# Patient Record
Sex: Male | Born: 1965 | Race: Black or African American | Hispanic: No | Marital: Single | Smoking: Current every day smoker
Health system: Southern US, Community
[De-identification: ages and names within clinical notes are randomized; demographics above are authoritative.]

## PROBLEM LIST (undated history)

## (undated) DIAGNOSIS — E559 Vitamin D deficiency, unspecified: Secondary | ICD-10-CM

## (undated) DIAGNOSIS — R569 Unspecified convulsions: Secondary | ICD-10-CM

## (undated) DIAGNOSIS — F329 Major depressive disorder, single episode, unspecified: Secondary | ICD-10-CM

## (undated) DIAGNOSIS — E785 Hyperlipidemia, unspecified: Secondary | ICD-10-CM

## (undated) DIAGNOSIS — I5032 Chronic diastolic (congestive) heart failure: Secondary | ICD-10-CM

## (undated) DIAGNOSIS — I517 Cardiomegaly: Secondary | ICD-10-CM

## (undated) DIAGNOSIS — I2510 Atherosclerotic heart disease of native coronary artery without angina pectoris: Secondary | ICD-10-CM

## (undated) DIAGNOSIS — F32A Depression, unspecified: Secondary | ICD-10-CM

## (undated) DIAGNOSIS — I495 Sick sinus syndrome: Secondary | ICD-10-CM

## (undated) DIAGNOSIS — G4730 Sleep apnea, unspecified: Secondary | ICD-10-CM

## (undated) HISTORY — DX: Cardiomegaly: I51.7

## (undated) HISTORY — DX: Sick sinus syndrome: I49.5

## (undated) HISTORY — DX: Atherosclerotic heart disease of native coronary artery without angina pectoris: I25.10

## (undated) HISTORY — DX: Sleep apnea, unspecified: G47.30

## (undated) HISTORY — DX: Chronic diastolic (congestive) heart failure: I50.32

---

## 2007-12-13 ENCOUNTER — Emergency Department: Payer: Self-pay | Admitting: Emergency Medicine

## 2009-12-12 ENCOUNTER — Emergency Department: Payer: Self-pay | Admitting: Emergency Medicine

## 2011-11-05 ENCOUNTER — Emergency Department: Payer: Self-pay | Admitting: Emergency Medicine

## 2012-05-28 DIAGNOSIS — N529 Male erectile dysfunction, unspecified: Secondary | ICD-10-CM | POA: Insufficient documentation

## 2012-09-10 ENCOUNTER — Emergency Department: Payer: Self-pay | Admitting: Emergency Medicine

## 2013-03-04 ENCOUNTER — Encounter (HOSPITAL_COMMUNITY): Payer: Self-pay | Admitting: Emergency Medicine

## 2013-03-04 ENCOUNTER — Emergency Department (HOSPITAL_COMMUNITY): Payer: Self-pay

## 2013-03-04 ENCOUNTER — Emergency Department (HOSPITAL_COMMUNITY)
Admission: EM | Admit: 2013-03-04 | Discharge: 2013-03-04 | Disposition: A | Discharge status: Home / Self Care | Payer: Self-pay | Attending: Emergency Medicine | Admitting: Emergency Medicine

## 2013-03-04 DIAGNOSIS — R0789 Other chest pain: Secondary | ICD-10-CM | POA: Insufficient documentation

## 2013-03-04 DIAGNOSIS — B9789 Other viral agents as the cause of diseases classified elsewhere: Secondary | ICD-10-CM

## 2013-03-04 DIAGNOSIS — R062 Wheezing: Secondary | ICD-10-CM

## 2013-03-04 DIAGNOSIS — F17200 Nicotine dependence, unspecified, uncomplicated: Secondary | ICD-10-CM | POA: Insufficient documentation

## 2013-03-04 DIAGNOSIS — R51 Headache: Secondary | ICD-10-CM | POA: Insufficient documentation

## 2013-03-04 DIAGNOSIS — J069 Acute upper respiratory infection, unspecified: Secondary | ICD-10-CM | POA: Insufficient documentation

## 2013-03-04 DIAGNOSIS — Z79899 Other long term (current) drug therapy: Secondary | ICD-10-CM | POA: Insufficient documentation

## 2013-03-04 DIAGNOSIS — G4730 Sleep apnea, unspecified: Secondary | ICD-10-CM | POA: Insufficient documentation

## 2013-03-04 DIAGNOSIS — G40909 Epilepsy, unspecified, not intractable, without status epilepticus: Secondary | ICD-10-CM | POA: Insufficient documentation

## 2013-03-04 DIAGNOSIS — R5381 Other malaise: Secondary | ICD-10-CM | POA: Insufficient documentation

## 2013-03-04 DIAGNOSIS — R5383 Other fatigue: Secondary | ICD-10-CM

## 2013-03-04 HISTORY — DX: Unspecified convulsions: R56.9

## 2013-03-04 MED ORDER — AEROCHAMBER PLUS W/MASK MISC
1.0000 | Freq: Once | Status: AC
  Administered 2013-03-04: 1
  Filled 2013-03-04: qty 1

## 2013-03-04 MED ORDER — ALBUTEROL SULFATE (2.5 MG/3ML) 0.083% IN NEBU
5.0000 mg | INHALATION_SOLUTION | Freq: Once | RESPIRATORY_TRACT | Status: AC
  Administered 2013-03-04: 5 mg via RESPIRATORY_TRACT
  Filled 2013-03-04: qty 6

## 2013-03-04 MED ORDER — NAPROXEN 250 MG PO TABS
500.0000 mg | ORAL_TABLET | Freq: Once | ORAL | Status: AC
  Administered 2013-03-04: 500 mg via ORAL
  Filled 2013-03-04: qty 2

## 2013-03-04 MED ORDER — ALBUTEROL SULFATE HFA 108 (90 BASE) MCG/ACT IN AERS
2.0000 | INHALATION_SPRAY | Freq: Once | RESPIRATORY_TRACT | Status: AC
  Administered 2013-03-04: 2 via RESPIRATORY_TRACT
  Filled 2013-03-04: qty 6.7

## 2013-03-04 NOTE — ED Notes (Signed)
Pt presents to department for evaluation of cold like symptoms. Pt states nasal and chest congestion. Also states productive cough. Denies fever. Respirations unlabored. Pt is alert and oriented x4.

## 2013-03-04 NOTE — Discharge Instructions (Signed)
1. Medications: Delsym cough suppressant, ibuprofen for fever control and headaches, albuterol for wheezing and shortness of breath, usual home medications 2. Treatment: rest, drink plenty of fluids,  3. Follow Up: Please followup with your primary doctor for discussion of your diagnoses and further evaluation after today's visit; if you do not have a primary care doctor use the resource guide provided to find one;     Read the instructions below on reasons to return to the emergency department and to learn more about your diagnosis.  Use over the counter medications for symptomatic relief as we discussed (musinex as a decongestant, Tylenol for fever/pain, Motrin/Ibuprofen for muscle aches).  Followup with your primary care doctor in 4 days if your symptoms persist.  Your more than welcome to return to the emergency department if symptoms worsen or become concerning.  Upper Respiratory Infection, Adult  An upper respiratory infection (URI) is also sometimes known as the common cold. Most people improve within 1 week, but symptoms can last up to 2 weeks. A residual cough may last even longer.   URI is most commonly caused by a virus. Viruses are NOT treated with antibiotics. You can easily spread the virus to others by oral contact. This includes kissing, sharing a glass, coughing, or sneezing. Touching your mouth or nose and then touching a surface, which is then touched by another person, can also spread the virus.   TREATMENT  Treatment is directed at relieving symptoms. There is no cure. Antibiotics are not effective, because the infection is caused by a virus, not by bacteria. Treatment may include:  Increased fluid intake. Sports drinks offer valuable electrolytes, sugars, and fluids.  Breathing heated mist or steam (vaporizer or shower).  Eating chicken soup or other clear broths, and maintaining good nutrition.  Getting plenty of rest.  Using gargles or lozenges for comfort.  Controlling  fevers with ibuprofen or acetaminophen as directed by your caregiver.  Increasing usage of your inhaler if you have asthma.  Return to work when your temperature has returned to normal.   SEEK MEDICAL CARE IF:  After the first few days, you feel you are getting worse rather than better.  You develop worsening shortness of breath, or brown or red sputum. These may be signs of pneumonia.  You develop yellow or brown nasal discharge or pain in the face, especially when you bend forward. These may be signs of sinusitis.  You develop a fever, swollen neck glands, pain with swallowing, or white areas in the back of your throat. These may be signs of strep throat.     Emergency Department Resource Guide 1) Find a Doctor and Pay Out of Pocket Although you won't have to find out who is covered by your insurance plan, it is a good idea to ask around and get recommendations. You will then need to call the office and see if the doctor you have chosen will accept you as a new patient and what types of options they offer for patients who are self-pay. Some doctors offer discounts or will set up payment plans for their patients who do not have insurance, but you will need to ask so you aren't surprised when you get to your appointment.  2) Contact Your Local Health Department Not all health departments have doctors that can see patients for sick visits, but many do, so it is worth a call to see if yours does. If you don't know where your local health department is, you can check  in your phone book. The CDC also has a tool to help you locate your state's health department, and many state websites also have listings of all of their local health departments.  3) Find a Walk-in Clinic If your illness is not likely to be very severe or complicated, you may want to try a walk in clinic. These are popping up all over the country in pharmacies, drugstores, and shopping centers. They're usually staffed by nurse  practitioners or physician assistants that have been trained to treat common illnesses and complaints. They're usually fairly quick and inexpensive. However, if you have serious medical issues or chronic medical problems, these are probably not your best option.  No Primary Care Doctor: - Call Health Connect at  [phone] - they can help you locate a primary care doctor that  accepts your insurance, provides certain services, etc. - Physician Referral Service- [phone]  Chronic Pain Problems: Organization         Address  Phone   Notes  [facility]  [phone] Patients need to be referred by their primary care doctor.   Medication Assistance: Organization         Address  Phone   Notes  [facility] Medication [facility] [address] [place], [place] [address] [phone] --Must be a resident of [facility] -- Must have NO insurance coverage whatsoever (no Medicaid/ Medicare, etc.) -- The pt. MUST have a primary care doctor that directs their care regularly and follows them in the community   MedAssist  [phone]   [employer]  [phone]    Agencies that provide inexpensive medical care: Organization         Address  Phone   Notes  [facility]  [phone]   [facility]    [phone]   [facility] [address] [place], [place] [address] [phone]   [facility] [address] [place] [place] [phone]   Planned Parenthood    [phone]   [facility]    [phone]   Community Health and [facility]  [address], [place] Phone:  [phone], Fax:  [phone] Hours of Operation:  9 am - 6 pm, M-F.  Also accepts Medicaid/Medicare and self-pay.  [facility]  [address], [place] Phone: [phone], Fax: [phone]. Hours of Operation:  8:30 am -  5:30 pm, M-F.  Also accepts Medicaid and self-pay.  [facility] [place] [address] [place] Phone: [phone]   [facility] [address] Natasha Bence [place], [place] [phone], Ext. [phone] Mondays & Thursdays: 7-9 AM.  First 15 patients are seen on a first come, first serve basis.    Medicaid-accepting [facility] Providers:  Organization         Address  Phone   Notes  [facility] [address], [place] [phone] Also accepts self-pay patients.  [facility] [address] Laurell Josephs [place], [place]  [phone]   [facility] [address], [place] [phone]   [facility] [address], [place] [phone]   Renaye Rakers [address], [place]   [phone] Only accepts [place] Access [place] patients after they have their name applied to their  card.   Self-Pay (no insurance) in [place] [facility] [place]:  Organization         Address  Phone   Notes  Sickle Cell Patients, [facility] [address], [place] [phone]   [facility] [address], [place] [phone]) [phone]   [facility] [place]  [address], [place] [phone]   [facility]/Dr. Osei-Bonsu  [address] [place] [address], [place] or [phone], [place] [phone] Phone number for both [place] and [place] locations is the same.  [facility] [address], [place] [phone]   [facility] [place] [address] [place] [address], [place] or [address] [phone]) [phone]) [phone]   [facility] [address], [place] [phone], phone; [phone]) [phone], fax Sees patients 1st and 3rd Saturday of every month.  Must not qualify for public or private insurance (i.e. Medicaid, Medicare, [place] Health Choice,  Veterans' Benefits)  Household income should be no more than 200% of the poverty level The clinic cannot treat you if you are pregnant or think you are pregnant  Sexually transmitted diseases are not treated at the clinic.    Dental Care: Organization         Address  Phone  Notes  [facility] of [facility] [address], [place] [phone] Accepts children up to age 50 who are enrolled in [place] or [place] Health Choice; pregnant women with a Medicaid card; and children who have applied for Medicaid or [place] Health Choice, but were declined, whose parents can pay a reduced fee at time of service.  [facility] of [facility] [place]  [address], [place] [phone] Accepts children up to age 50 who are enrolled in [place] or [place] Health Choice; pregnant women with a Medicaid card; and children who have applied for Medicaid or [place] Health Choice, but were declined, whose parents can pay a reduced fee at time of service.  [facility]  [address], [place] [phone] Patients are seen by appointment only. Walk-ins are not accepted. [facility] will see patients 50 years of age and older. Monday - Tuesday (8am-5pm) Most Wednesdays (8:30-5pm) $30 per visit, cash only  [facility]  [address], [facility] [phone] Patients are seen by appointment only. Walk-ins are not accepted. [facility] will see patients 50 years of age and older. One Wednesday Evening (Monthly: Volunteer Based).  $30 per visit, cash only  [employer] [facility] [facility]  [phone]) [phone] for adults; Children under age 734, call Graduate Pediatric Dentistry at [phone]) [phone]. Children aged 664-14, please call [phone] to request a pediatric application.  Dental services are provided in all areas of dental care including fillings, crowns and bridges, complete and  partial dentures, implants, gum treatment, root canals, and extractions. Preventive care is also provided. Treatment is provided to both adults and children. Patients are selected via a lottery and there is often a waiting list.   [facility] [address], [place]  [phone]) [phone] [email or web address]   [facility] [address], [place], [place] [phone], Ext. [phone] Second and Fourth Thursday of each month, opens at 6:30 AM; Clinic ends at 9 AM.  Patients are seen on a first-come first-served basis, and  a limited number are seen during each clinic.   [facility]  [address] Hillard Danker [place], [place] [phone]   Eligibility Requirements You must have lived in [place], [place], or [place] counties for at least the last three months.   You cannot be eligible for state or federal sponsored [employer], including [employer], [place], or [employer].   You generally cannot be eligible for healthcare insurance through your employer.    How to apply: Eligibility screenings are held every Tuesday and Wednesday afternoon from 1:00 pm until 4:00 pm. You do not need an appointment for the interview!  [facility] [address], [place], [place]   [place]  [place] Department  [place]  [phone]    Behavioral Health Resources in the Community: Intensive Outpatient Programs Organization         Address  Phone  Notes  [place]. [address], [place], [place] [phone]   [facility] Outpatient [address] [place], [place]   ADS: Alcohol & Drug Svcs [address], [place], [place]   [place] [address],  [place], [place] or [phone]   Substance Abuse Resources Organization          Address  Phone  Notes  Alcohol and Drug Services  [phone]   [place]  [phone]   The [place]   Chinita Pester  [phone]   Residential & Outpatient Substance Abuse Program  [phone]   Psychological Services Organization         Address  Phone  Notes  [facility] [place]  [place]  [phone]   [place] [address], [place] or [phone]    Mobile Crisis Teams Organization         Address  Phone  Notes  [facility], Mobile Crisis Care Unit  [phone]   [facility]  [address] [place], [place]   [place] Levels [address], [place] [phone]    Self-Help/Support Groups Organization         Address  Phone             Notes  [place]. of [place] - variety of support groups  [place] Call for more information  Narcotics Anonymous (NA), [facility]aring [facility] [address], [employer] [place] [place]  2 meetings at this location   Special educational needs teacher         Address  Phone  Notes  [facility]idential [facility] [place],    [place]  [phone]   [facility]  [address], [place] [address], [place], [place]   [place], [place] [phone] Admissions: 8am-3pm M-F  Incentives Substance [place] [address],    [place], [place] [phone]   [facility] [address] Jadene Pierini [place], [place]   [facility] [address]  [place], [place]   [facility] - Intensive Outpatient [address], [place], [place]   [facility] [place].,  [place], [place] or [phone]   [facility] (RTS) [address] [place], [place] Accepts Medicaid  Fellowship  [address]  [place] [phone] Substance Abuse/Addiction Treatment   [facility] Organization         Address  Phone  Notes  [facility]  [phone]   Angie Fava, PhD [address] Ervin Knack [place], [place]   [phone] or [phone]   [facility]   [address] [place], [place] [phone]   [facility] [address], [place], [place] [phone] Insurance/Medicaid/sponsorship through [facility] and Families [address]                                    [place], [place] [phone] Therapy/tele-psych/case  [facility] [address], [place] [phone]    Dr. Lolly Mustache  [phone]   [facility] [place]  United Way [facility]. 1) [address], [place] 2) [address], [place] 3)  [address], [place] [phone]  [phone]   [facility] Child Abuse Hotline [phone] or [phone] (After Hours)

## 2013-03-04 NOTE — ED Provider Notes (Signed)
CSN: [id number]     Arrival date & time 03/04/13  1658 History  This chart was scribed for non-physician practitioner [name], PA-C working with [name], MD by [name], ED Scribe. This patient was seen in room TR05C/TR05C and the patient's care was started at 2004.    First MD Initiated Contact with Patient 03/04/13 2004     Chief Complaint  Patient presents with  . Nasal Congestion  . Cough    The history is provided by the patient and the spouse. No language interpreter was used.   HPI Comments: Seth Tucker is a 48 y.o. male with hx of seizures and sleep apnea, who presents to the Emergency Department complaining of 5 days of gradual onset, gradually worsening, moderate nasal and chest congestion with associated productive cough, subjective fever, mild SOB upon exertion. His cough is worse at night. He recently discontinued use of Lasix (several months ago) and reports that he only uses it as needed. He reports exposure to sick individuals. He denies any other pain.  Past Medical History  Diagnosis Date  . Seizure    History reviewed. No pertinent past surgical history. History reviewed. No pertinent family history. History  Substance Use Topics  . Smoking status: Current Every Day Smoker    Types: Cigarettes  . Smokeless tobacco: Not on file  . Alcohol Use: No    Review of Systems  Constitutional: Positive for fatigue. Negative for fever, chills and appetite change.  HENT: Positive for congestion, postnasal drip, rhinorrhea, sinus pressure and sore throat. Negative for ear discharge, ear pain and mouth sores.   Eyes: Negative for visual disturbance.  Respiratory: Positive for cough, chest tightness, shortness of breath and wheezing. Negative for stridor.   Cardiovascular: Negative for chest pain, palpitations and leg swelling.  Gastrointestinal: Negative for nausea, vomiting, abdominal pain and diarrhea.  Genitourinary: Negative for dysuria, urgency,  frequency and hematuria.  Musculoskeletal: Negative for arthralgias, back pain, myalgias and neck stiffness.  Skin: Negative for rash.  Neurological: Positive for headaches. Negative for syncope, light-headedness and numbness.  Hematological: Negative for adenopathy.  Psychiatric/Behavioral: The patient is not nervous/anxious.   All other systems reviewed and are negative.    Allergies  Review of patient's allergies indicates no known allergies.  Home Medications   Current Outpatient Rx  Name  Route  Sig  Dispense  Refill  . furosemide (LASIX) 20 MG tablet   Oral   Take 20 mg by mouth daily as needed for fluid or edema.          . naproxen sodium (ANAPROX) 220 MG tablet   Oral   Take 220 mg by mouth daily as needed (for pain).         Marland Kitchen PHENObarbital (LUMINAL) 100 MG tablet   Oral   Take 200 mg by mouth at bedtime.            BP 139/77  Pulse 74  Temp(Src) 98.8 F (37.1 C) (Oral)  Resp 24  Ht 5\' 11"  (1.803 m)  Wt 240 lb (108.863 kg)  BMI 33.49 kg/m2  SpO2 94%  Physical Exam  Constitutional: He is oriented to person, place, and time. He appears well-developed and well-nourished. No distress.  HENT:  Head: Normocephalic and atraumatic.  Right Ear: Tympanic membrane, external ear and ear canal normal.  Left Ear: Tympanic membrane, external ear and ear canal normal.  Nose: Mucosal edema and rhinorrhea present. No epistaxis. Right sinus exhibits no maxillary sinus tenderness and  no frontal sinus tenderness. Left sinus exhibits no maxillary sinus tenderness and no frontal sinus tenderness.  Mouth/Throat: Uvula is midline and mucous membranes are normal. Mucous membranes are not pale and not cyanotic. No oropharyngeal exudate, posterior oropharyngeal edema, posterior oropharyngeal erythema or tonsillar abscesses.  Nasal congestion and rhinorrhea  Eyes: Conjunctivae are normal. Pupils are equal, round, and reactive to light.  Neck: Normal range of motion and full  passive range of motion without pain.  Cardiovascular: Normal rate and intact distal pulses.   Pulmonary/Chest: Effort normal. No stridor. He has wheezes.  Inspiratory and expiratory wheezes throughout. No rales or rhonchi. Actively coughing.   Abdominal: Soft. Bowel sounds are normal. There is no tenderness.  Musculoskeletal: Normal range of motion.  Lymphadenopathy:    He has no cervical adenopathy.  Neurological: He is alert and oriented to person, place, and time.  Skin: Skin is dry. No rash noted. He is not diaphoretic.  Psychiatric: He has a normal mood and affect.    ED Course  Procedures (including critical care time) DIAGNOSTIC STUDIES: Oxygen Saturation is 94% on room air, adequate by my interpretation.    COORDINATION OF CARE: 8:37 PM Discussed treatment plan which includes Hycodan, a breathing treatment and 800 mg ibuprofen 3x/day with pt at bedside and pt agreed to plan. Discussed CXR results.Advised pt importance of staying hydrated. Advised pt to follow up with PCP if symptoms do not improve in a week.   Labs Review Labs Reviewed - No data to display Imaging Review Dg Chest 2 View  03/04/2013   CLINICAL DATA:  Nasal congestion, cough  EXAM: CHEST - 2 VIEW  COMPARISON:  None available  FINDINGS: Mild thin linear opacities in the right mid lung and both lung bases. No confluent airspace consolidation. . No effusion. Visualized skeletal structures are unremarkable.  IMPRESSION: No acute cardiopulmonary disease.   Electronically Signed   By: Oley Balmaniel  Hassell M.D.   On: 03/04/2013 20:18    EKG Interpretation   None       MDM   1. Viral URI with cough   2. Wheezing    [name] presents with shortness of breath, wheezing and URI symptoms. Patient afebrile and not tachycardic here in the department.  Pt CXR negative for acute infiltrate. I personally reviewed the imaging tests through PACS system.  I reviewed available ER/hospitalization records through the EMR.   Patients symptoms are consistent with URI, likely viral etiology. Will give albuterol treatment here in the department and reassess.   10:35 PM Patient clear and equal breath sounds after albuterol treatment. Patient reports he is breathing much better and does feel better. Discussed that antibiotics are not indicated for viral infections. Pt will be discharged with symptomatic treatment.  Verbalizes understanding and is agreeable with plan. Pt is hemodynamically stable & in NAD prior to dc.  It has been determined that no acute conditions requiring further emergency intervention are present at this time. The patient/guardian have been advised of the diagnosis and plan. We have discussed signs and symptoms that warrant return to the ED, such as changes or worsening in symptoms.   Vital signs are stable at discharge.   BP 139/77  Pulse 74  Temp(Src) 98.8 F (37.1 C) (Oral)  Resp 24  Ht 5\' 11"  (1.803 m)  Wt 240 lb (108.863 kg)  BMI 33.49 kg/m2  SpO2 94%  Patient/guardian has voiced understanding and agreed to follow-up with the PCP or specialist.    I personally performed the  services described in this documentation, which was scribed in my presence. The recorded information has been reviewed and is accurate.      Dahlia Client Tyera Hansley, PA-C 03/04/13 2237

## 2013-03-05 NOTE — ED Provider Notes (Signed)
Medical screening examination/treatment/procedure(s) were performed by non-physician practitioner and as supervising physician I was immediately available for consultation/collaboration.  Flint MelterElliott L Lyvonne Cassell, MD 03/05/13 [phone]

## 2015-04-12 ENCOUNTER — Emergency Department (HOSPITAL_COMMUNITY): Payer: No Typology Code available for payment source

## 2015-04-12 ENCOUNTER — Observation Stay (HOSPITAL_COMMUNITY)
Admission: EM | Admit: 2015-04-12 | Discharge: 2015-04-14 | Disposition: A | Discharge status: Home / Self Care | Payer: No Typology Code available for payment source | Attending: Internal Medicine | Admitting: Internal Medicine

## 2015-04-12 ENCOUNTER — Encounter (HOSPITAL_COMMUNITY): Payer: Self-pay | Admitting: Emergency Medicine

## 2015-04-12 DIAGNOSIS — E785 Hyperlipidemia, unspecified: Secondary | ICD-10-CM | POA: Diagnosis not present

## 2015-04-12 DIAGNOSIS — R55 Syncope and collapse: Secondary | ICD-10-CM | POA: Diagnosis not present

## 2015-04-12 DIAGNOSIS — R001 Bradycardia, unspecified: Secondary | ICD-10-CM | POA: Diagnosis not present

## 2015-04-12 DIAGNOSIS — Z9119 Patient's noncompliance with other medical treatment and regimen: Secondary | ICD-10-CM | POA: Diagnosis not present

## 2015-04-12 DIAGNOSIS — G4733 Obstructive sleep apnea (adult) (pediatric): Secondary | ICD-10-CM | POA: Insufficient documentation

## 2015-04-12 DIAGNOSIS — Z720 Tobacco use: Secondary | ICD-10-CM

## 2015-04-12 DIAGNOSIS — G40B09 Juvenile myoclonic epilepsy, not intractable, without status epilepticus: Secondary | ICD-10-CM | POA: Diagnosis not present

## 2015-04-12 DIAGNOSIS — F17210 Nicotine dependence, cigarettes, uncomplicated: Secondary | ICD-10-CM | POA: Insufficient documentation

## 2015-04-12 DIAGNOSIS — F32A Depression, unspecified: Secondary | ICD-10-CM

## 2015-04-12 DIAGNOSIS — F329 Major depressive disorder, single episode, unspecified: Secondary | ICD-10-CM

## 2015-04-12 DIAGNOSIS — I2510 Atherosclerotic heart disease of native coronary artery without angina pectoris: Secondary | ICD-10-CM | POA: Insufficient documentation

## 2015-04-12 DIAGNOSIS — Z7982 Long term (current) use of aspirin: Secondary | ICD-10-CM | POA: Insufficient documentation

## 2015-04-12 DIAGNOSIS — E559 Vitamin D deficiency, unspecified: Secondary | ICD-10-CM | POA: Diagnosis not present

## 2015-04-12 DIAGNOSIS — R412 Retrograde amnesia: Secondary | ICD-10-CM | POA: Diagnosis present

## 2015-04-12 DIAGNOSIS — M549 Dorsalgia, unspecified: Secondary | ICD-10-CM | POA: Diagnosis not present

## 2015-04-12 DIAGNOSIS — Z79899 Other long term (current) drug therapy: Secondary | ICD-10-CM | POA: Diagnosis not present

## 2015-04-12 HISTORY — DX: Hyperlipidemia, unspecified: E78.5

## 2015-04-12 HISTORY — DX: Major depressive disorder, single episode, unspecified: F32.9

## 2015-04-12 HISTORY — DX: Vitamin D deficiency, unspecified: E55.9

## 2015-04-12 HISTORY — DX: Depression, unspecified: F32.A

## 2015-04-12 LAB — I-STAT TROPONIN, ED: TROPONIN I, POC: 0.02 ng/mL (ref 0.00–0.08)

## 2015-04-12 LAB — CBC WITH DIFFERENTIAL/PLATELET
BASOS PCT: 0 %
Basophils Absolute: 0 10*3/uL (ref 0.0–0.1)
EOS ABS: 0.1 10*3/uL (ref 0.0–0.7)
EOS PCT: 1 %
HCT: 42.8 % (ref 39.0–52.0)
Hemoglobin: 14 g/dL (ref 13.0–17.0)
Lymphocytes Relative: 14 %
Lymphs Abs: 1.8 10*3/uL (ref 0.7–4.0)
MCH: 29.4 pg (ref 26.0–34.0)
MCHC: 32.7 g/dL (ref 30.0–36.0)
MCV: 89.9 fL (ref 78.0–100.0)
Monocytes Absolute: 1.2 10*3/uL — ABNORMAL HIGH (ref 0.1–1.0)
Monocytes Relative: 9 %
Neutro Abs: 10.2 10*3/uL — ABNORMAL HIGH (ref 1.7–7.7)
Neutrophils Relative %: 76 %
PLATELETS: 231 10*3/uL (ref 150–400)
RBC: 4.76 MIL/uL (ref 4.22–5.81)
RDW: 13.9 % (ref 11.5–15.5)
WBC: 13.3 10*3/uL — ABNORMAL HIGH (ref 4.0–10.5)

## 2015-04-12 LAB — RAPID URINE DRUG SCREEN, HOSP PERFORMED
AMPHETAMINES: NOT DETECTED
BARBITURATES: POSITIVE — AB
BENZODIAZEPINES: NOT DETECTED
Cocaine: NOT DETECTED
OPIATES: NOT DETECTED
TETRAHYDROCANNABINOL: NOT DETECTED

## 2015-04-12 LAB — COMPREHENSIVE METABOLIC PANEL
ALT: 16 U/L — ABNORMAL LOW (ref 17–63)
AST: 21 U/L (ref 15–41)
Albumin: 3.8 g/dL (ref 3.5–5.0)
Alkaline Phosphatase: 56 U/L (ref 38–126)
Anion gap: 11 (ref 5–15)
BUN: 13 mg/dL (ref 6–20)
CHLORIDE: 110 mmol/L (ref 101–111)
CO2: 20 mmol/L — AB (ref 22–32)
CREATININE: 0.77 mg/dL (ref 0.61–1.24)
Calcium: 9.4 mg/dL (ref 8.9–10.3)
GFR calc non Af Amer: >60 mL/min (ref >60)
GLUCOSE: 122 mg/dL — AB (ref 65–99)
Potassium: 4.4 mmol/L (ref 3.5–5.1)
SODIUM: 141 mmol/L (ref 135–145)
Total Bilirubin: 0.2 mg/dL — ABNORMAL LOW (ref 0.3–1.2)
Total Protein: 6.8 g/dL (ref 6.5–8.1)

## 2015-04-12 LAB — TROPONIN I: Troponin I: 0.03 ng/mL (ref <0.031)

## 2015-04-12 LAB — ETHANOL: Alcohol, Ethyl (B): <5 mg/dL (ref <5)

## 2015-04-12 LAB — PHENOBARBITAL LEVEL: PHENOBARBITAL: 37.3 ug/mL (ref 15.0–40.0)

## 2015-04-12 MED ORDER — LAMOTRIGINE 25 MG PO TABS
50.0000 mg | ORAL_TABLET | Freq: Two times a day (BID) | ORAL | Status: DC
  Administered 2015-04-12 – 2015-04-14 (×4): 50 mg via ORAL
  Filled 2015-04-12 (×7): qty 2

## 2015-04-12 MED ORDER — ENOXAPARIN SODIUM 40 MG/0.4ML ~~LOC~~ SOLN
40.0000 mg | SUBCUTANEOUS | Status: DC
  Administered 2015-04-12 – 2015-04-13 (×2): 40 mg via SUBCUTANEOUS
  Filled 2015-04-12 (×2): qty 0.4

## 2015-04-12 MED ORDER — ASPIRIN EC 81 MG PO TBEC
81.0000 mg | DELAYED_RELEASE_TABLET | Freq: Every day | ORAL | Status: DC
  Administered 2015-04-13 – 2015-04-14 (×2): 81 mg via ORAL
  Filled 2015-04-12 (×2): qty 1

## 2015-04-12 MED ORDER — VENLAFAXINE HCL ER 37.5 MG PO CP24
37.5000 mg | ORAL_CAPSULE | Freq: Every day | ORAL | Status: DC
  Administered 2015-04-12 – 2015-04-14 (×3): 37.5 mg via ORAL
  Filled 2015-04-12 (×3): qty 1

## 2015-04-12 MED ORDER — ACETAMINOPHEN 650 MG RE SUPP
650.0000 mg | Freq: Four times a day (QID) | RECTAL | Status: DC | PRN
Start: 2015-04-12 — End: 2015-04-14

## 2015-04-12 MED ORDER — BISACODYL 10 MG RE SUPP: 10.0000 mg | Freq: Every day | RECTAL | Status: DC | PRN

## 2015-04-12 MED ORDER — ATORVASTATIN CALCIUM 80 MG PO TABS
80.0000 mg | ORAL_TABLET | Freq: Every day | ORAL | Status: DC
  Administered 2015-04-12 – 2015-04-14 (×3): 80 mg via ORAL
  Filled 2015-04-12: qty 1
  Filled 2015-04-12: qty 8
  Filled 2015-04-12: qty 1

## 2015-04-12 MED ORDER — ACETAMINOPHEN 325 MG PO TABS: 650.0000 mg | ORAL_TABLET | Freq: Four times a day (QID) | ORAL | Status: DC | PRN

## 2015-04-12 MED ORDER — PHENOBARBITAL 100 MG PO TABS: 200.0000 mg | ORAL_TABLET | Freq: Every day | ORAL | Status: DC

## 2015-04-12 MED ORDER — LAMOTRIGINE 5 MG PO CHEW: 50.0000 mg | CHEWABLE_TABLET | Freq: Two times a day (BID) | ORAL | Status: DC

## 2015-04-12 MED ORDER — PANTOPRAZOLE SODIUM 40 MG PO TBEC
40.0000 mg | DELAYED_RELEASE_TABLET | Freq: Every day | ORAL | Status: DC
  Administered 2015-04-12 – 2015-04-14 (×3): 40 mg via ORAL
  Filled 2015-04-12 (×3): qty 1

## 2015-04-12 MED ORDER — SODIUM CHLORIDE 0.9% FLUSH
3.0000 mL | Freq: Two times a day (BID) | INTRAVENOUS | Status: DC
  Administered 2015-04-12 – 2015-04-13 (×3): 3 mL via INTRAVENOUS

## 2015-04-12 MED ORDER — POLYETHYLENE GLYCOL 3350 17 G PO PACK: 17.0000 g | PACK | Freq: Every day | ORAL | Status: DC | PRN

## 2015-04-12 MED ORDER — PHENOBARBITAL 97.2 MG PO TABS
194.4000 mg | ORAL_TABLET | Freq: Every day | ORAL | Status: DC
  Administered 2015-04-12 – 2015-04-13 (×2): 194.4 mg via ORAL
  Filled 2015-04-12 (×2): qty 2

## 2015-04-12 MED ORDER — ASPIRIN 81 MG PO CHEW
324.0000 mg | CHEWABLE_TABLET | Freq: Once | ORAL | Status: AC
  Administered 2015-04-12: 324 mg via ORAL
  Filled 2015-04-12: qty 4

## 2015-04-12 MED ORDER — HYDROMORPHONE HCL 1 MG/ML IJ SOLN: 1.0000 mg | INTRAMUSCULAR | Status: DC | PRN

## 2015-04-12 MED ORDER — HYDROCODONE-ACETAMINOPHEN 5-325 MG PO TABS
1.0000 | ORAL_TABLET | ORAL | Status: DC | PRN
  Administered 2015-04-12: 2 via ORAL
  Filled 2015-04-12: qty 2

## 2015-04-12 NOTE — ED Notes (Signed)
Gave pt Sprite, per [employer] - RN
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
## 2015-04-12 NOTE — ED Notes (Signed)
Attempted report x 2 

## 2015-04-12 NOTE — ED Notes (Signed)
Attempted report 
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
## 2015-04-12 NOTE — ED Notes (Signed)
Ordered pt a heart healthy tray, per Molli Hazard, RN. Spoke with Inetta Fermo.
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
## 2015-04-13 ENCOUNTER — Ambulatory Visit (HOSPITAL_BASED_OUTPATIENT_CLINIC_OR_DEPARTMENT_OTHER): Payer: No Typology Code available for payment source

## 2015-04-13 ENCOUNTER — Observation Stay (HOSPITAL_COMMUNITY): Payer: No Typology Code available for payment source

## 2015-04-13 DIAGNOSIS — R55 Syncope and collapse: Secondary | ICD-10-CM

## 2015-04-13 DIAGNOSIS — R569 Unspecified convulsions: Secondary | ICD-10-CM

## 2015-04-13 DIAGNOSIS — G4733 Obstructive sleep apnea (adult) (pediatric): Secondary | ICD-10-CM

## 2015-04-13 DIAGNOSIS — G40B19 Juvenile myoclonic epilepsy, intractable, without status epilepticus: Secondary | ICD-10-CM

## 2015-04-13 DIAGNOSIS — F329 Major depressive disorder, single episode, unspecified: Secondary | ICD-10-CM

## 2015-04-13 LAB — BASIC METABOLIC PANEL
ANION GAP: 8 (ref 5–15)
BUN: 9 mg/dL (ref 6–20)
CALCIUM: 9.2 mg/dL (ref 8.9–10.3)
CO2: 24 mmol/L (ref 22–32)
CREATININE: 0.78 mg/dL (ref 0.61–1.24)
Chloride: 107 mmol/L (ref 101–111)
GFR calc non Af Amer: >60 mL/min (ref >60)
GLUCOSE: 90 mg/dL (ref 65–99)
Potassium: 4.2 mmol/L (ref 3.5–5.1)
SODIUM: 139 mmol/L (ref 135–145)

## 2015-04-13 LAB — CBC
HCT: 42.1 % (ref 39.0–52.0)
Hemoglobin: 14 g/dL (ref 13.0–17.0)
MCH: 29.7 pg (ref 26.0–34.0)
MCHC: 33.3 g/dL (ref 30.0–36.0)
MCV: 89.2 fL (ref 78.0–100.0)
PLATELETS: 256 10*3/uL (ref 150–400)
RBC: 4.72 MIL/uL (ref 4.22–5.81)
RDW: 14.1 % (ref 11.5–15.5)
WBC: 10 10*3/uL (ref 4.0–10.5)

## 2015-04-13 LAB — GLUCOSE, CAPILLARY
GLUCOSE-CAPILLARY: 91 mg/dL (ref 65–99)
Glucose-Capillary: 102 mg/dL — ABNORMAL HIGH (ref 65–99)

## 2015-04-13 LAB — TROPONIN I: Troponin I: <0.03 ng/mL (ref <0.031)

## 2015-04-13 NOTE — Progress Notes (Signed)
  Echocardiogram 2D Echocardiogram has been performed.  Leta Jungling M 04/13/2015, 1:19 PM

## 2015-04-13 NOTE — Progress Notes (Signed)
Pt refuse CPAP for the night. Pt states that he does want to use it throughout this hospital stay but just not tonight. Told pt if he gets SOB and/or changes his mind to let his RN know to notify me. Pt is stable at this time no complications or distress noted.

## 2015-04-13 NOTE — Progress Notes (Signed)
CPAP set up at bedside. Patient instructed on use and he states that he will place it on when he is ready. RT will continue to monitor as needed.

## 2015-04-13 NOTE — Progress Notes (Signed)
EEG Completed; Results Pending  
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
## 2015-04-14 ENCOUNTER — Telehealth: Payer: Self-pay

## 2015-04-14 ENCOUNTER — Other Ambulatory Visit: Payer: Self-pay | Admitting: Physician Assistant

## 2015-04-14 DIAGNOSIS — R55 Syncope and collapse: Secondary | ICD-10-CM

## 2015-04-14 DIAGNOSIS — R001 Bradycardia, unspecified: Secondary | ICD-10-CM

## 2015-04-14 DIAGNOSIS — G40B09 Juvenile myoclonic epilepsy, not intractable, without status epilepticus: Secondary | ICD-10-CM

## 2015-04-14 LAB — GLUCOSE, CAPILLARY: GLUCOSE-CAPILLARY: 93 mg/dL (ref 65–99)
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
## 2015-04-14 NOTE — Telephone Encounter (Signed)
Attestation signed by Chrystie Nose, MD at 04/14/2015 3:43 PM  Pt. Seen and examined. Agree with the NP/PA-C note as written. Agree with above recommendations. Will arrange for a 30 day monitor in  [place] (due to bradycardia) and can follow-up with Dr. Alvino Chapel (one of our new partners) there. Ok to d/c home today from a cardiac standpoint.  Thanks for the consult.  Chrystie Nose, MD, [facility] Attending Cardiologist [facility]     Left message on machine for patient to contact the office.
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
## 2015-04-14 NOTE — Telephone Encounter (Signed)
Patient needs event monitor from [employer] mailed to them s/p hospital visit.  See order.  Thank you !!

## 2015-04-14 NOTE — Progress Notes (Signed)
Patient discharged home. IV was dc'd and was intact. Patient was instructed that he was on seizure precaution and should not drive unless seizure free for six months nor should he operate heavy equipment. He stated that he understood.

## 2015-04-15 NOTE — Telephone Encounter (Signed)
Left message on machine for patient to contact the office.   

## 2015-04-18 NOTE — Telephone Encounter (Signed)
Information submitted to [employer]

## 2015-04-19 NOTE — Telephone Encounter (Signed)
Left message on machine for patient to contact the office.   

## 2015-04-20 NOTE — Telephone Encounter (Signed)
S/w pt who states he thinks [employer] is delivering event monitor today. Encouraged pt put on monitor today. Confirmed March 8 appt w/Dr. Alvino Chapel. Pt states he will be at the appointment and had no further questions.

## 2015-04-22 ENCOUNTER — Ambulatory Visit (INDEPENDENT_AMBULATORY_CARE_PROVIDER_SITE_OTHER): Payer: Self-pay

## 2015-04-22 DIAGNOSIS — R55 Syncope and collapse: Secondary | ICD-10-CM

## 2015-04-22 DIAGNOSIS — R001 Bradycardia, unspecified: Secondary | ICD-10-CM

## 2015-04-27 ENCOUNTER — Encounter: Payer: Self-pay | Admitting: *Deleted

## 2015-04-27 ENCOUNTER — Encounter: Payer: Self-pay | Admitting: Cardiology

## 2015-04-27 ENCOUNTER — Ambulatory Visit (INDEPENDENT_AMBULATORY_CARE_PROVIDER_SITE_OTHER): Payer: Self-pay | Admitting: Cardiology

## 2015-04-27 VITALS — BP 129/79 | HR 61 | Ht 70.0 in | Wt 226.2 lb

## 2015-04-27 DIAGNOSIS — R030 Elevated blood-pressure reading, without diagnosis of hypertension: Secondary | ICD-10-CM

## 2015-04-27 DIAGNOSIS — R079 Chest pain, unspecified: Secondary | ICD-10-CM

## 2015-04-27 DIAGNOSIS — E669 Obesity, unspecified: Secondary | ICD-10-CM

## 2015-04-27 DIAGNOSIS — R55 Syncope and collapse: Secondary | ICD-10-CM

## 2015-04-27 DIAGNOSIS — Z720 Tobacco use: Secondary | ICD-10-CM

## 2015-04-27 DIAGNOSIS — G4733 Obstructive sleep apnea (adult) (pediatric): Secondary | ICD-10-CM

## 2015-04-27 DIAGNOSIS — R001 Bradycardia, unspecified: Secondary | ICD-10-CM

## 2015-04-27 DIAGNOSIS — IMO0001 Reserved for inherently not codable concepts without codable children: Secondary | ICD-10-CM

## 2015-04-27 DIAGNOSIS — R0602 Shortness of breath: Secondary | ICD-10-CM
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
## 2015-04-27 NOTE — Patient Instructions (Addendum)
Medication Instructions:  Your physician recommends that you continue on your current medications as directed. Please refer to the Current Medication list given to you today.   Labwork: None ordered  Testing/Procedures:  Your physician has requested that you have en exercise stress myoview. For further information please visit [email or web address]. Please follow instruction sheet, as given.   [facility] MYOVIEW  Your caregiver has ordered a Stress Test with nuclear imaging. The purpose of this test is to evaluate the blood supply to your heart muscle. This procedure is referred to as a "Non-Invasive Stress Test." This is because other than having an IV started in your vein, nothing is inserted or "invades" your body. Cardiac stress tests are done to find areas of poor blood flow to the heart by determining the extent of coronary artery disease (CAD). Some patients exercise on a treadmill, which naturally increases the blood flow to your heart, while others who are  unable to walk on a treadmill due to physical limitations have a pharmacologic/chemical stress agent called Lexiscan . This medicine will mimic walking on a treadmill by temporarily increasing your coronary blood flow.   Please note: these test may take anywhere between 2-4 hours to complete  PLE[facility] REPORT TO [facility]  THE VOLUNTEERS AT THE FIRST DESK WILL DIRECT YOU WHERE TO GO  Date of Procedure:_____March 9, 2017________________________________  Arrival Time for Procedure:______07:15 AM____________________    PLEASE NOTIFY THE OFFICE AT LEAST 24 HOURS IN ADVANCE IF YOU ARE UNABLE TO KEEP YOUR APPOINTMENT.  [phone] AND  PLEASE NOTIFY [facility] AT [facility] AT LEAST 24 HOURS IN ADVANCE IF YOU ARE UNABLE TO KEEP YOUR APPOINTMENT. [phone]  How to prepare for your Myoview test:  1. Do not eat or drink after midnight 2. No caffeine for 24 hours prior to test 3. No smoking 24 hours prior to  test. 4. Your medication may be taken with water.  If your doctor stopped a medication because of this test, do not take that medication. 5. Ladies, please do not wear dresses.  Skirts or pants are appropriate. Please wear a short sleeve shirt. 6. No perfume, cologne or lotion. 7. Wear comfortable walking shoes. No heels!            Follow-Up: Your physician recommends that you schedule a follow-up appointment after testing to review results.  Date & Time: ____________________________________________________   Any Other Special Instructions Will Be Listed Below (If Applicable).     If you need a refill on your cardiac medications before your next appointment, please call your pharmacy.

## 2015-04-28 ENCOUNTER — Encounter
Admission: RE | Admit: 2015-04-28 | Discharge: 2015-04-28 | Disposition: A | Discharge status: Home / Self Care | Payer: Self-pay | Source: Ambulatory Visit | Attending: Cardiology | Admitting: Cardiology

## 2015-04-28 ENCOUNTER — Telehealth: Payer: Self-pay | Admitting: Cardiology

## 2015-04-28 DIAGNOSIS — Q218 Other congenital malformations of cardiac septa: Secondary | ICD-10-CM | POA: Insufficient documentation

## 2015-04-28 DIAGNOSIS — R001 Bradycardia, unspecified: Secondary | ICD-10-CM

## 2015-04-28 DIAGNOSIS — R55 Syncope and collapse: Secondary | ICD-10-CM

## 2015-04-28 LAB — NM MYOCAR MULTI W/SPECT W/WALL MOTION / EF
Estimated workload: 10.1 METS
Exercise duration (min): 9 min
Exercise duration (sec): 31 s
LV dias vol: 110 mL (ref 62–150)
LV sys vol: 39 mL
MPHR: 169 {beats}/min
Peak HR: 144 {beats}/min
Percent HR: 85 %
Rest HR: 60 {beats}/min
SDS: 0
SRS: 0
SSS: 0
TID: 0.82

## 2015-04-28 MED ORDER — TECHNETIUM TC 99M SESTAMIBI - CARDIOLITE
14.1360 | Freq: Once | INTRAVENOUS | Status: AC | PRN
  Administered 2015-04-28: 08:00:00 14.136 via INTRAVENOUS

## 2015-04-28 MED ORDER — NITROGLYCERIN 0.4 MG SL SUBL: 0.4000 mg | SUBLINGUAL_TABLET | SUBLINGUAL | Status: DC | PRN

## 2015-04-28 MED ORDER — TECHNETIUM TC 99M SESTAMIBI - CARDIOLITE
30.0700 | Freq: Once | INTRAVENOUS | Status: AC | PRN
  Administered 2015-04-28: 09:00:00 30.07 via INTRAVENOUS

## 2015-04-28 NOTE — Telephone Encounter (Signed)
Instructed patient on the use of nitroglycerin and [facility]. He had no further questions at this time. Let him know that I would be in touch with him tomorrow regarding an appointment to come in and talk with someone about catheterization.

## 2015-05-03 ENCOUNTER — Telehealth: Payer: Self-pay | Admitting: Cardiology

## 2015-05-03 ENCOUNTER — Other Ambulatory Visit: Payer: Self-pay

## 2015-05-03 ENCOUNTER — Inpatient Hospital Stay (HOSPITAL_COMMUNITY)
Admission: EM | Admit: 2015-05-03 | Discharge: 2015-05-05 | DRG: 244 | Disposition: A | Discharge status: Home / Self Care | Payer: Self-pay | Attending: Cardiology | Admitting: Cardiology

## 2015-05-03 ENCOUNTER — Encounter: Payer: Self-pay | Admitting: *Deleted

## 2015-05-03 ENCOUNTER — Encounter (HOSPITAL_COMMUNITY): Payer: Self-pay | Admitting: Emergency Medicine

## 2015-05-03 ENCOUNTER — Ambulatory Visit (INDEPENDENT_AMBULATORY_CARE_PROVIDER_SITE_OTHER): Payer: Self-pay | Admitting: Cardiology

## 2015-05-03 ENCOUNTER — Emergency Department (HOSPITAL_COMMUNITY): Payer: Self-pay

## 2015-05-03 ENCOUNTER — Encounter: Payer: Self-pay | Admitting: Cardiology

## 2015-05-03 VITALS — BP 126/80 | HR 64 | Ht 70.0 in | Wt 231.1 lb

## 2015-05-03 DIAGNOSIS — I455 Other specified heart block: Principal | ICD-10-CM | POA: Diagnosis present

## 2015-05-03 DIAGNOSIS — Z950 Presence of cardiac pacemaker: Secondary | ICD-10-CM

## 2015-05-03 DIAGNOSIS — I499 Cardiac arrhythmia, unspecified: Secondary | ICD-10-CM

## 2015-05-03 DIAGNOSIS — Z7982 Long term (current) use of aspirin: Secondary | ICD-10-CM

## 2015-05-03 DIAGNOSIS — Z79899 Other long term (current) drug therapy: Secondary | ICD-10-CM

## 2015-05-03 DIAGNOSIS — R079 Chest pain, unspecified: Secondary | ICD-10-CM

## 2015-05-03 DIAGNOSIS — G40909 Epilepsy, unspecified, not intractable, without status epilepticus: Secondary | ICD-10-CM | POA: Diagnosis present

## 2015-05-03 DIAGNOSIS — E785 Hyperlipidemia, unspecified: Secondary | ICD-10-CM

## 2015-05-03 DIAGNOSIS — F329 Major depressive disorder, single episode, unspecified: Secondary | ICD-10-CM | POA: Diagnosis present

## 2015-05-03 DIAGNOSIS — I2510 Atherosclerotic heart disease of native coronary artery without angina pectoris: Secondary | ICD-10-CM | POA: Diagnosis present

## 2015-05-03 DIAGNOSIS — R9439 Abnormal result of other cardiovascular function study: Secondary | ICD-10-CM | POA: Insufficient documentation

## 2015-05-03 DIAGNOSIS — Z6833 Body mass index (BMI) 33.0-33.9, adult: Secondary | ICD-10-CM

## 2015-05-03 DIAGNOSIS — G4733 Obstructive sleep apnea (adult) (pediatric): Secondary | ICD-10-CM | POA: Diagnosis present

## 2015-05-03 DIAGNOSIS — E669 Obesity, unspecified: Secondary | ICD-10-CM | POA: Diagnosis present

## 2015-05-03 DIAGNOSIS — R0602 Shortness of breath: Secondary | ICD-10-CM

## 2015-05-03 DIAGNOSIS — F17210 Nicotine dependence, cigarettes, uncomplicated: Secondary | ICD-10-CM | POA: Diagnosis present

## 2015-05-03 DIAGNOSIS — R931 Abnormal findings on diagnostic imaging of heart and coronary circulation: Secondary | ICD-10-CM

## 2015-05-03 DIAGNOSIS — E559 Vitamin D deficiency, unspecified: Secondary | ICD-10-CM | POA: Diagnosis present

## 2015-05-03 LAB — CBC
HEMATOCRIT: 42.7 % (ref 39.0–52.0)
HEMOGLOBIN: 13.8 g/dL (ref 13.0–17.0)
MCH: 29.4 pg (ref 26.0–34.0)
MCHC: 32.3 g/dL (ref 30.0–36.0)
MCV: 91 fL (ref 78.0–100.0)
Platelets: 286 10*3/uL (ref 150–400)
RBC: 4.69 MIL/uL (ref 4.22–5.81)
RDW: 14.5 % (ref 11.5–15.5)
WBC: 11.2 10*3/uL — ABNORMAL HIGH (ref 4.0–10.5)

## 2015-05-03 LAB — I-STAT TROPONIN, ED: Troponin i, poc: 0 ng/mL (ref 0.00–0.08)

## 2015-05-03 LAB — BASIC METABOLIC PANEL
ANION GAP: 9 (ref 5–15)
BUN: 7 mg/dL (ref 6–20)
CALCIUM: 9.6 mg/dL (ref 8.9–10.3)
CO2: 23 mmol/L (ref 22–32)
CREATININE: 0.71 mg/dL (ref 0.61–1.24)
Chloride: 110 mmol/L (ref 101–111)
GFR calc Af Amer: >60 mL/min (ref >60)
GLUCOSE: 100 mg/dL — AB (ref 65–99)
Potassium: 4.3 mmol/L (ref 3.5–5.1)
Sodium: 142 mmol/L (ref 135–145)

## 2015-05-03 LAB — PROTIME-INR
INR: 1.06 (ref 0.00–1.49)
PROTHROMBIN TIME: 14 s (ref 11.6–15.2)

## 2015-05-03 MED ORDER — ASPIRIN 81 MG PO CHEW
324.0000 mg | CHEWABLE_TABLET | Freq: Once | ORAL | Status: AC
  Administered 2015-05-03: 324 mg via ORAL
  Filled 2015-05-03: qty 4

## 2015-05-03 MED ORDER — SODIUM CHLORIDE 0.9 % IV SOLN: 250.0000 mL | INTRAVENOUS | Status: DC | PRN

## 2015-05-03 MED ORDER — CHOLECALCIFEROL 250 MCG (10000 UT) PO CAPS: 50000.0000 [IU] | ORAL_CAPSULE | ORAL | Status: DC

## 2015-05-03 MED ORDER — ATORVASTATIN CALCIUM 40 MG PO TABS
40.0000 mg | ORAL_TABLET | Freq: Every day | ORAL | Status: DC
  Administered 2015-05-04 – 2015-05-05 (×2): 40 mg via ORAL
  Filled 2015-05-03 (×2): qty 1

## 2015-05-03 MED ORDER — PHENOBARBITAL 97.2 MG PO TABS
194.4000 mg | ORAL_TABLET | Freq: Every day | ORAL | Status: DC
  Administered 2015-05-03 – 2015-05-04 (×2): 194.4 mg via ORAL
  Filled 2015-05-03 (×2): qty 2

## 2015-05-03 MED ORDER — ASPIRIN EC 81 MG PO TBEC
81.0000 mg | DELAYED_RELEASE_TABLET | Freq: Every day | ORAL | Status: DC
  Administered 2015-05-03 – 2015-05-05 (×2): 81 mg via ORAL
  Filled 2015-05-03: qty 1

## 2015-05-03 MED ORDER — LAMOTRIGINE 25 MG PO TABS
50.0000 mg | ORAL_TABLET | Freq: Every day | ORAL | Status: DC
  Administered 2015-05-03 – 2015-05-04 (×2): 50 mg via ORAL
  Filled 2015-05-03 (×2): qty 2

## 2015-05-03 MED ORDER — PANTOPRAZOLE SODIUM 40 MG PO TBEC
40.0000 mg | DELAYED_RELEASE_TABLET | Freq: Every day | ORAL | Status: DC
  Administered 2015-05-03 – 2015-05-05 (×3): 40 mg via ORAL
  Filled 2015-05-03 (×3): qty 1

## 2015-05-03 MED ORDER — SODIUM CHLORIDE 0.9 % WEIGHT BASED INFUSION
3.0000 mL/kg/h | INTRAVENOUS | Status: DC
Start: 2015-05-04 — End: 2015-05-04
  Administered 2015-05-04: 3 mL/kg/h via INTRAVENOUS

## 2015-05-03 MED ORDER — SODIUM CHLORIDE 0.9% FLUSH: 3.0000 mL | INTRAVENOUS | Status: DC | PRN

## 2015-05-03 MED ORDER — ACETAMINOPHEN 325 MG PO TABS: 650.0000 mg | ORAL_TABLET | ORAL | Status: DC | PRN

## 2015-05-03 MED ORDER — SODIUM CHLORIDE 0.9% FLUSH
3.0000 mL | Freq: Two times a day (BID) | INTRAVENOUS | Status: DC
  Administered 2015-05-04: 3 mL via INTRAVENOUS

## 2015-05-03 MED ORDER — ONDANSETRON HCL 4 MG/2ML IJ SOLN
4.0000 mg | Freq: Four times a day (QID) | INTRAMUSCULAR | Status: DC | PRN
Start: 2015-05-03 — End: 2015-05-05

## 2015-05-03 MED ORDER — SODIUM CHLORIDE 0.9% FLUSH: 3.0000 mL | Freq: Two times a day (BID) | INTRAVENOUS | Status: DC

## 2015-05-03 MED ORDER — SODIUM CHLORIDE 0.9 % WEIGHT BASED INFUSION
1.0000 mL/kg/h | INTRAVENOUS | Status: DC
  Administered 2015-05-04: 1 mL/kg/h via INTRAVENOUS

## 2015-05-03 MED ORDER — NITROGLYCERIN 0.4 MG SL SUBL: 0.4000 mg | SUBLINGUAL_TABLET | SUBLINGUAL | Status: DC | PRN

## 2015-05-03 MED ORDER — HEPARIN (PORCINE) IN NACL 100-0.45 UNIT/ML-% IJ SOLN
1400.0000 [IU]/h | INTRAMUSCULAR | Status: DC
  Administered 2015-05-04: 1400 [IU]/h via INTRAVENOUS
  Filled 2015-05-03: qty 250

## 2015-05-03 MED ORDER — ALPRAZOLAM 0.25 MG PO TABS: 0.2500 mg | ORAL_TABLET | Freq: Two times a day (BID) | ORAL | Status: DC | PRN

## 2015-05-03 MED ORDER — ASPIRIN 81 MG PO CHEW
324.0000 mg | CHEWABLE_TABLET | ORAL | Status: AC
  Administered 2015-05-04: 324 mg via ORAL
  Filled 2015-05-03: qty 4

## 2015-05-03 MED ORDER — VITAMIN D (ERGOCALCIFEROL) 1.25 MG (50000 UNIT) PO CAPS: 50000.0000 [IU] | ORAL_CAPSULE | ORAL | Status: DC

## 2015-05-03 MED ORDER — HEPARIN BOLUS VIA INFUSION
4000.0000 [IU] | Freq: Once | INTRAVENOUS | Status: AC
  Administered 2015-05-04: 4000 [IU] via INTRAVENOUS
  Filled 2015-05-03: qty 4000

## 2015-05-03 MED ORDER — ZOLPIDEM TARTRATE 5 MG PO TABS: 5.0000 mg | ORAL_TABLET | Freq: Every evening | ORAL | Status: DC | PRN

## 2015-05-03 MED ORDER — VITAMIN D 1000 UNITS PO TABS
1000.0000 [IU] | ORAL_TABLET | Freq: Every day | ORAL | Status: DC
  Administered 2015-05-04 – 2015-05-05 (×2): 1000 [IU] via ORAL
  Filled 2015-05-03 (×2): qty 1

## 2015-05-03 MED ORDER — PHENOBARBITAL 100 MG PO TABS: 200.0000 mg | ORAL_TABLET | Freq: Every day | ORAL | Status: DC
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
## 2015-05-03 NOTE — ED Notes (Signed)
Receiving RN in contact room performing a procedure with patient.

## 2015-05-03 NOTE — Telephone Encounter (Signed)
Patient called back in to let me know that he was able to get a ride and will be going [facility].

## 2015-05-03 NOTE — Telephone Encounter (Signed)
Confirmed with patient that cardiac catheterization will be on March 29th at 12:30 pm. Patient verbalized understanding of all instructions.

## 2015-05-03 NOTE — ED Notes (Signed)
Pt sent by cardiologist. Pt has is wearing heart monitor for cp. Today they noticed that he had 2 pauses in heart rhythm one lasting 8 seconds. Pt sts sharp cp lasts only a few seconds. Also report associated sob.  He was scheduled for cardiac cath in 2 weeks but now they want him to have one sooner. Skin warm and dry. nad noted.

## 2015-05-03 NOTE — Telephone Encounter (Signed)
Left message on voicemail for patient to call back regarding procedure date and time.

## 2015-05-03 NOTE — Patient Instructions (Addendum)
Medication Instructions:  Your physician recommends that you continue on your current medications as directed. Please refer to the Current Medication list given to you today.   Labwork: Labs to have done before stress test are CBC, PT/INR, and BMP.   Testing/Procedures: Your physician has requested that you have a cardiac catheterization. Cardiac catheterization is used to diagnose and/or treat various heart conditions. Doctors may recommend this procedure for a number of different reasons. The most common reason is to evaluate chest pain. Chest pain can be a symptom of coronary artery disease (CAD), and cardiac catheterization can show whether plaque is narrowing or blocking your heart's arteries. This procedure is also used to evaluate the valves, as well as measure the blood flow and oxygen levels in different parts of your heart. For further information please visit [email or web address]. Please follow instruction sheet, as given.  Date & Time: ______________________________________________________________  Follow-Up: Your physician recommends that you schedule a follow-up appointment on May 30, 2015 at 10:00 AM  Any Other Special Instructions Will Be Listed Below (If Applicable).     If you need a refill on your cardiac medications before your next appointment, please call your pharmacy.  [facility] Cardiac Cath Instructions   You are scheduled for a Cardiac Cath on:_________________________  Please arrive at _______am on the day of your procedure  You will need to pre-register prior to the day of your procedure.  [facility].  Registration is the first desk on your right.  Please take the procedure order we have given you in order to be registered appropriately  Do not eat/drink anything after midnight  Someone will need to drive you home  It is recommended someone be with you for the first 24 hours after your procedure  Wear clothes that are easy to  get on/off and wear slip on shoes if possible   Medications bring a current list of all medications with you  ___ You may take all of your medications the morning of your procedure with enough water to swallow safely  ___ Do not take these medications before your procedure:_______________________________________________________________________________________________________________________________________________________________________________________________________________   Day of your procedure: Arrive at the Medical Mall entrance.  Free valet service is available.  After entering the Medical Mall please check-in at the registration desk (1st desk on your right) to receive your armband. After receiving your armband someone will escort you to the cardiac cath/special procedures waiting area.  The usual length of stay after your procedure is about 2 to 3 hours.  This can vary.  If you have any questions, please call our office at [phone], or you may call the cardiac cath lab at [facility] directly at [phone]
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
## 2015-05-03 NOTE — Progress Notes (Signed)
ANTICOAGULATION CONSULT NOTE - Initial Consult  Pharmacy Consult for Heparin  Indication: chest pain/ACS  Allergies  Allergen Reactions  . Bee Venom Anaphylaxis and Swelling  . Peanuts [Peanut Oil] Anaphylaxis  . Influenza Vaccines Other (See Comments)    Seizures    Vital Signs: Temp: 98.5 F (36.9 C) (03/14 1647) Temp Source: Oral (03/14 1647) BP: 150/120 mmHg (03/14 2215) Pulse Rate: 61 (03/14 2215)  Labs:  Recent Labs  05/03/15 1813  HGB 13.8  HCT 42.7  PLT 286  LABPROT 14.0  INR 1.06  CREATININE 0.71    Estimated Creatinine Clearance: 132.4 mL/min (by C-G formula based on Cr of 0.71).   Medical History: Past Medical History  Diagnosis Date  . Seizure (HCC)   . Depression   . Hyperlipidemia   . Vitamin D deficiency   . Enlarged heart   . Sleep apnea    Assessment: 50 y/o M here for cath, two pauses recorded on outpatient heart monitor, CBC/renal function good, other labs reviewed.   Goal of Therapy:  Heparin level 0.3-0.7 units/ml Monitor platelets by anticoagulation protocol: Yes   Plan -Heparin 4000 units BOLUS -Start heparin drip at 1400 units/hr -0800 HL -Daily CBC/HL -Monitor for bleeding  Abran DukeLedford, Marvette Schamp 05/03/2015,11:25 PM

## 2015-05-03 NOTE — Telephone Encounter (Signed)
Seth Tucker with [employer] called to let us know that patient was in sinus rhythm and had 2 pauses this morning one was 8 seconds and the second was 4 seconds. She will be faxing over these events. Notified Dr. Alvino ChapelIngal here in the office of these events and will give her fax results when they arrive.

## 2015-05-03 NOTE — Telephone Encounter (Signed)
Spoke with patient by phone and discussed with him results from event monitor and need for catheterization. Dr. Alvino ChapelIngal wants him to go to [facility] for procedure and evaluation by EP. Patient verbalizes understanding to [facility]. Called Eme Charge Nurse in ED at [facility] to let her know that patient would be coming in today.

## 2015-05-03 NOTE — ED Notes (Signed)
MD at bedside. 

## 2015-05-03 NOTE — Telephone Encounter (Signed)
Transmission from 05/03/2015 11:48 AM were reviewed. Sinus rhythm with 4 second and 8 second pauses noted. Please call patient and find out what he remembers at around that time. Any loss of consciousness, any other symptoms. Recommend that we consult EP for this and for evaluation for pacemaker placement.
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
## 2015-05-04 ENCOUNTER — Encounter (HOSPITAL_COMMUNITY)
Admission: EM | Disposition: A | Discharge status: Home / Self Care | Payer: Self-pay | Source: Home / Self Care | Attending: Cardiology

## 2015-05-04 ENCOUNTER — Ambulatory Visit (HOSPITAL_COMMUNITY)
Admission: RE | Admit: 2015-05-04 | Payer: No Typology Code available for payment source | Source: Ambulatory Visit | Admitting: Cardiovascular Disease

## 2015-05-04 DIAGNOSIS — R55 Syncope and collapse: Secondary | ICD-10-CM

## 2015-05-04 DIAGNOSIS — I495 Sick sinus syndrome: Secondary | ICD-10-CM

## 2015-05-04 DIAGNOSIS — R931 Abnormal findings on diagnostic imaging of heart and coronary circulation: Secondary | ICD-10-CM

## 2015-05-04 HISTORY — PX: CARDIAC CATHETERIZATION: SHX172

## 2015-05-04 HISTORY — PX: EP IMPLANTABLE DEVICE: SHX172B

## 2015-05-04 LAB — CBC
HCT: 41.8 % (ref 39.0–52.0)
HEMOGLOBIN: 14 g/dL (ref 13.0–17.0)
MCH: 30.2 pg (ref 26.0–34.0)
MCHC: 33.5 g/dL (ref 30.0–36.0)
MCV: 90.3 fL (ref 78.0–100.0)
PLATELETS: 241 10*3/uL (ref 150–400)
RBC: 4.63 MIL/uL (ref 4.22–5.81)
RDW: 14.4 % (ref 11.5–15.5)
WBC: 13 10*3/uL — AB (ref 4.0–10.5)

## 2015-05-04 LAB — COMPREHENSIVE METABOLIC PANEL
ALBUMIN: 3.6 g/dL (ref 3.5–5.0)
ALK PHOS: 56 U/L (ref 38–126)
ALT: 17 U/L (ref 17–63)
AST: 14 U/L — AB (ref 15–41)
Anion gap: 10 (ref 5–15)
BILIRUBIN TOTAL: 0.3 mg/dL (ref 0.3–1.2)
BUN: 10 mg/dL (ref 6–20)
CALCIUM: 9.5 mg/dL (ref 8.9–10.3)
CO2: 24 mmol/L (ref 22–32)
CREATININE: 0.86 mg/dL (ref 0.61–1.24)
Chloride: 108 mmol/L (ref 101–111)
GFR calc Af Amer: >60 mL/min (ref >60)
GLUCOSE: 110 mg/dL — AB (ref 65–99)
Potassium: 4 mmol/L (ref 3.5–5.1)
Sodium: 142 mmol/L (ref 135–145)
TOTAL PROTEIN: 6.4 g/dL — AB (ref 6.5–8.1)

## 2015-05-04 LAB — LIPID PANEL
CHOLESTEROL: 158 mg/dL (ref 0–200)
HDL: 51 mg/dL (ref >40)
LDL CALC: 98 mg/dL (ref 0–99)
Total CHOL/HDL Ratio: 3.1 RATIO
Triglycerides: 47 mg/dL (ref <150)
VLDL: 9 mg/dL (ref 0–40)

## 2015-05-04 LAB — HEPARIN LEVEL (UNFRACTIONATED)
HEPARIN UNFRACTIONATED: 0.75 [IU]/mL — AB (ref 0.30–0.70)
Heparin Unfractionated: 0.49 IU/mL (ref 0.30–0.70)

## 2015-05-04 SURGERY — LEFT HEART CATH AND CORONARY ANGIOGRAPHY

## 2015-05-04 SURGERY — PACEMAKER IMPLANT
Anesthesia: LOCAL

## 2015-05-04 MED ORDER — SODIUM CHLORIDE 0.9 % IR SOLN
80.0000 mg | Status: AC
  Administered 2015-05-04: 80 mg

## 2015-05-04 MED ORDER — CEFAZOLIN SODIUM-DEXTROSE 2-3 GM-% IV SOLR
INTRAVENOUS | Status: DC | PRN
  Administered 2015-05-04: 2 g via INTRAVENOUS

## 2015-05-04 MED ORDER — SODIUM CHLORIDE 0.9 % IV SOLN: 250.0000 mL | INTRAVENOUS | Status: DC

## 2015-05-04 MED ORDER — MIDAZOLAM HCL 5 MG/5ML IJ SOLN
INTRAMUSCULAR | Status: AC
  Filled 2015-05-04: qty 5

## 2015-05-04 MED ORDER — HEPARIN (PORCINE) IN NACL 2-0.9 UNIT/ML-% IJ SOLN
INTRAMUSCULAR | Status: DC | PRN
  Administered 2015-05-04: 10 mL via INTRA_ARTERIAL

## 2015-05-04 MED ORDER — FENTANYL CITRATE (PF) 100 MCG/2ML IJ SOLN
INTRAMUSCULAR | Status: AC
  Filled 2015-05-04: qty 2

## 2015-05-04 MED ORDER — MIDAZOLAM HCL 5 MG/5ML IJ SOLN
INTRAMUSCULAR | Status: DC | PRN
  Administered 2015-05-04 (×2): 1 mg via INTRAVENOUS
  Administered 2015-05-04: 2 mg via INTRAVENOUS
  Administered 2015-05-04: 1 mg via INTRAVENOUS

## 2015-05-04 MED ORDER — IOHEXOL 350 MG/ML SOLN
INTRAVENOUS | Status: DC | PRN
  Administered 2015-05-04: 35 mL via INTRA_ARTERIAL

## 2015-05-04 MED ORDER — HEPARIN (PORCINE) IN NACL 100-0.45 UNIT/ML-% IJ SOLN: 1300.0000 [IU]/h | INTRAMUSCULAR | Status: DC

## 2015-05-04 MED ORDER — HEPARIN SODIUM (PORCINE) 1000 UNIT/ML IJ SOLN
INTRAMUSCULAR | Status: AC
  Filled 2015-05-04: qty 1

## 2015-05-04 MED ORDER — FENTANYL CITRATE (PF) 100 MCG/2ML IJ SOLN
INTRAMUSCULAR | Status: DC | PRN
  Administered 2015-05-04: 50 ug via INTRAVENOUS

## 2015-05-04 MED ORDER — CEFAZOLIN SODIUM 1-5 GM-% IV SOLN
1.0000 g | Freq: Four times a day (QID) | INTRAVENOUS | Status: AC
  Administered 2015-05-05 (×3): 1 g via INTRAVENOUS
  Filled 2015-05-04 (×3): qty 50

## 2015-05-04 MED ORDER — HEPARIN (PORCINE) IN NACL 2-0.9 UNIT/ML-% IJ SOLN
INTRAMUSCULAR | Status: AC
  Filled 2015-05-04: qty 1000

## 2015-05-04 MED ORDER — SODIUM CHLORIDE 0.9 % IV SOLN: 250.0000 mL | INTRAVENOUS | Status: DC | PRN

## 2015-05-04 MED ORDER — MIDAZOLAM HCL 2 MG/2ML IJ SOLN
INTRAMUSCULAR | Status: DC | PRN
  Administered 2015-05-04: 1 mg via INTRAVENOUS

## 2015-05-04 MED ORDER — LIDOCAINE HCL (PF) 1 % IJ SOLN
INTRAMUSCULAR | Status: AC
  Filled 2015-05-04: qty 30

## 2015-05-04 MED ORDER — HEPARIN (PORCINE) IN NACL 2-0.9 UNIT/ML-% IJ SOLN
INTRAMUSCULAR | Status: DC | PRN
  Administered 2015-05-04: 1000 mL

## 2015-05-04 MED ORDER — FENTANYL CITRATE (PF) 100 MCG/2ML IJ SOLN
INTRAMUSCULAR | Status: DC | PRN
  Administered 2015-05-04 (×3): 12.5 ug via INTRAVENOUS

## 2015-05-04 MED ORDER — HEPARIN (PORCINE) IN NACL 2-0.9 UNIT/ML-% IJ SOLN
INTRAMUSCULAR | Status: DC | PRN
  Administered 2015-05-04: 17:00:00

## 2015-05-04 MED ORDER — CHLORHEXIDINE GLUCONATE 4 % EX LIQD: 60.0000 mL | Freq: Once | CUTANEOUS | Status: DC

## 2015-05-04 MED ORDER — HEPARIN SODIUM (PORCINE) 1000 UNIT/ML IJ SOLN
INTRAMUSCULAR | Status: DC | PRN
  Administered 2015-05-04: 5000 [IU] via INTRAVENOUS

## 2015-05-04 MED ORDER — IBUPROFEN 200 MG PO TABS
200.0000 mg | ORAL_TABLET | Freq: Four times a day (QID) | ORAL | Status: DC | PRN
  Administered 2015-05-04: 200 mg via ORAL
  Filled 2015-05-04: qty 1

## 2015-05-04 MED ORDER — SODIUM CHLORIDE 0.9% FLUSH: 3.0000 mL | INTRAVENOUS | Status: DC | PRN

## 2015-05-04 MED ORDER — CEFAZOLIN SODIUM-DEXTROSE 2-3 GM-% IV SOLR: 2.0000 g | INTRAVENOUS | Status: DC

## 2015-05-04 MED ORDER — SODIUM CHLORIDE 0.9% FLUSH: 3.0000 mL | Freq: Two times a day (BID) | INTRAVENOUS | Status: DC

## 2015-05-04 MED ORDER — LIDOCAINE HCL (PF) 1 % IJ SOLN
INTRAMUSCULAR | Status: DC | PRN
  Administered 2015-05-04: 31 mL

## 2015-05-04 MED ORDER — LIDOCAINE HCL (PF) 1 % IJ SOLN
INTRAMUSCULAR | Status: DC | PRN
  Administered 2015-05-04: 3 mL via INTRADERMAL

## 2015-05-04 MED ORDER — SODIUM CHLORIDE 0.9 % IV SOLN: INTRAVENOUS | Status: DC

## 2015-05-04 MED ORDER — LIDOCAINE HCL (PF) 1 % IJ SOLN
INTRAMUSCULAR | Status: AC
  Filled 2015-05-04: qty 60

## 2015-05-04 MED ORDER — MIDAZOLAM HCL 2 MG/2ML IJ SOLN
INTRAMUSCULAR | Status: AC
  Filled 2015-05-04: qty 2

## 2015-05-04 MED ORDER — VERAPAMIL HCL 2.5 MG/ML IV SOLN
INTRAVENOUS | Status: AC
  Filled 2015-05-04: qty 2

## 2015-05-04 MED ORDER — SODIUM CHLORIDE 0.9 % IR SOLN
Status: AC
  Filled 2015-05-04: qty 2

## 2015-05-04 MED ORDER — SODIUM CHLORIDE 0.9 % WEIGHT BASED INFUSION: 1.0000 mL/kg/h | INTRAVENOUS | Status: AC

## 2015-05-04 MED ORDER — HEPARIN (PORCINE) IN NACL 2-0.9 UNIT/ML-% IJ SOLN
INTRAMUSCULAR | Status: AC
  Filled 2015-05-04: qty 500

## 2015-05-04 MED ORDER — CEFAZOLIN SODIUM-DEXTROSE 2-3 GM-% IV SOLR
INTRAVENOUS | Status: AC
  Filled 2015-05-04: qty 50

## 2015-05-04 SURGICAL SUPPLY — 9 items
CATH OPTITORQUE JACKY 4.0 5F (CATHETERS) ×3 IMPLANT
DEVICE RAD COMP TR BAND LRG (VASCULAR PRODUCTS) ×3 IMPLANT
GLIDESHEATH SLEND SS 6F .021 (SHEATH) ×3 IMPLANT
KIT HEART LEFT (KITS) ×3 IMPLANT
PACK CARDIAC CATHETERIZATION (CUSTOM PROCEDURE TRAY) ×3 IMPLANT
SYR MEDRAD MARK V 150ML (SYRINGE) IMPLANT
TRANSDUCER W/STOPCOCK (MISCELLANEOUS) ×3 IMPLANT
TUBING CIL FLEX 10 FLL-RA (TUBING) ×3 IMPLANT
WIRE SAFE-T 1.5MM-J .035X260CM (WIRE) ×3 IMPLANT

## 2015-05-04 SURGICAL SUPPLY — 9 items
CABLE SURGICAL S-101-97-12 (CABLE) ×2 IMPLANT
INGEVITY MRI 7741-52CM (Lead) ×2 IMPLANT
INGEVITY MRI 7742-59CM (Lead) ×2 IMPLANT
LEAD PACING INGEVITY MRI 52CM (Lead) ×1 IMPLANT
LEAD PACING INGEVITY MRI 59CM (Lead) ×1 IMPLANT
PACEMAKER ACCOLADE DR-EL (Pacemaker) ×2 IMPLANT
PAD DEFIB LIFELINK (PAD) ×2 IMPLANT
SHEATH CLASSIC 7F (SHEATH) ×4 IMPLANT
TRAY PACEMAKER INSERTION (PACKS) ×2 IMPLANT

## 2015-05-04 NOTE — Interval H&P Note (Signed)
History and Physical Interval Note:  05/04/2015 3:08 PM  Seth Tucker  has presented today for surgery, with the diagnosis of abnormal mioview  The various methods of treatment have been discussed with the patient and family. After consideration of risks, benefits and other options for treatment, the patient has consented to  Procedure(s): Left Heart Cath and Coronary Angiography (N/A) as a surgical intervention .  The patient's history has been reviewed, patient examined, no change in status, stable for surgery.  I have reviewed the patient's chart and labs.  Questions were answered to the patient's satisfaction.     Lorine BearsMuhammad Arida

## 2015-05-04 NOTE — Progress Notes (Addendum)
ANTICOAGULATION CONSULT NOTE - Follow Up Consult  Pharmacy Consult:  Heparin Indication: chest pain/ACS  Allergies  Allergen Reactions  . Bee Venom Anaphylaxis and Swelling  . Peanuts [Peanut Oil] Anaphylaxis  . Influenza Vaccines Other (See Comments)    Seizures    Patient Measurements: Height: 5\' 10"  (177.8 cm) Weight: 226 lb 3.1 oz (102.6 kg) IBW/kg (Calculated) : 73 Heparin Dosing Weight: 95 kg  Vital Signs: Temp: 98 F (36.7 C) (03/15 0438) Temp Source: Oral (03/15 0438) BP: 140/72 mmHg (03/15 0438) Pulse Rate: 59 (03/15 0438)  Labs:  Recent Labs  05/03/15 1813 05/04/15 0240 05/04/15 0726  HGB 13.8 14.0  --   HCT 42.7 41.8  --   PLT 286 241  --   LABPROT 14.0  --   --   INR 1.06  --   --   HEPARINUNFRC  --   --  0.49  CREATININE 0.71 0.86  --     Estimated Creatinine Clearance: 121.9 mL/min (by C-G formula based on Cr of 0.86).      Assessment: 49 YOM continues on IV heparin for ACS/abnormal stress test.  Heparin level is therapeutic; no bleeding reported.   Goal of Therapy:  HL 0.3 - 0.7 units/mL Monitor platelets by anticoagulation protocol: Yes    Plan:  - Continue heparin gtt at 1400 units/hr - Check confirmatory HL - Daily HL / CBC    Ryder Chesmore D. Laney Potashang, PharmD, BCPS Pager:  [phone] 05/04/2015, 8:25 AM    ====================================   Addendum: - HL slightly elevated at 0.75 units/mL - no bleeding   Plan: - Reduce heparin gtt to 1300 units/hr until patient goes to the cath lab - F/U post cath    Jodell Weitman D. Laney Potashang, PharmD, BCPS Pager:  [phone] 05/04/2015, 1:20 PM

## 2015-05-04 NOTE — Progress Notes (Signed)
Pt scheduled for cardiac cath at 1 PM. Only pre-cath orders in place are for fluids. Called cardiology master to request pre-cath orders be put in. Pt made NPO at 0730. IV team consult placed for 2nd IV site at 0940.   Leonidas Rombergaitlin S Bumbledare, RN

## 2015-05-04 NOTE — Progress Notes (Signed)
Utilization review completed.  

## 2015-05-04 NOTE — Progress Notes (Signed)
Pt received from cath lab. R radial site clean, dry. 3 cc of air removed from TR band. 6 cc remaining. L chest dressing over pacemaker clean, dry, intact.   Leonidas Rombergaitlin S Bumbledare, RN
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
## 2015-05-05 ENCOUNTER — Encounter (HOSPITAL_COMMUNITY): Payer: Self-pay | Admitting: Cardiovascular Disease

## 2015-05-05 ENCOUNTER — Inpatient Hospital Stay (HOSPITAL_COMMUNITY): Payer: MEDICAID

## 2015-05-05 LAB — CBC
HEMATOCRIT: 41.5 % (ref 39.0–52.0)
HEMOGLOBIN: 13.4 g/dL (ref 13.0–17.0)
MCH: 29.3 pg (ref 26.0–34.0)
MCHC: 32.3 g/dL (ref 30.0–36.0)
MCV: 90.8 fL (ref 78.0–100.0)
Platelets: 235 10*3/uL (ref 150–400)
RBC: 4.57 MIL/uL (ref 4.22–5.81)
RDW: 14.4 % (ref 11.5–15.5)
WBC: 10.5 10*3/uL (ref 4.0–10.5)

## 2015-05-05 MED ORDER — ASPIRIN 81 MG PO TBEC: 81.0000 mg | DELAYED_RELEASE_TABLET | Freq: Every day | ORAL | Status: DC

## 2015-05-05 NOTE — Progress Notes (Signed)
Reviewed discharge instructions with patient. IV removed. Patient interested in medical records.  Number given to patient. Awaiting wheelchair for discharge.   Seth Tucker, Charlaine DaltonAnn Brooke RN
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
## 2015-05-05 NOTE — Progress Notes (Signed)
Orthopedic Tech Progress Note Patient Details:  Seth Tucker [birth year] [id number]  Ortho Devices Type of Ortho Device: Arm sling   Seth Tucker 05/05/2015, 8:23 AM
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
## 2015-05-09 ENCOUNTER — Telehealth: Payer: Self-pay | Admitting: Cardiology

## 2015-05-09 ENCOUNTER — Encounter: Payer: Self-pay | Admitting: *Deleted

## 2015-05-09 NOTE — Telephone Encounter (Signed)
Patient needs note for court. He had pacemaker placed last week and was discharged on 05/05/15 from hospital. Let him know that I would check into this for him and call him back.

## 2015-05-09 NOTE — Telephone Encounter (Signed)
Pt calling stating he need letter for court He is suppose to go tomorrow and says that he just got pacer put in last week  Not sure if he can get one or not  Pt would like to know  Please advise

## 2015-05-10 ENCOUNTER — Telehealth: Payer: Self-pay | Admitting: *Deleted

## 2015-05-10 NOTE — Telephone Encounter (Signed)
Dr. Alvino ChapelIngal signed general letter for patient regarding his discharge and inability to drive at this time. Called patient and let him know that I would mail to his home address and verified that it was correct in our system. Let him know that if he had any further questions to please call us back. He verbalized understanding and had no further needs at this time.

## 2015-05-10 NOTE — Telephone Encounter (Signed)
Duplicate

## 2015-05-10 NOTE — Telephone Encounter (Signed)
Left message on voicemail for patient to call back. 

## 2015-05-16 ENCOUNTER — Ambulatory Visit (INDEPENDENT_AMBULATORY_CARE_PROVIDER_SITE_OTHER): Payer: Self-pay | Admitting: *Deleted

## 2015-05-16 ENCOUNTER — Telehealth: Payer: Self-pay | Admitting: Cardiology

## 2015-05-16 ENCOUNTER — Encounter: Payer: Self-pay | Admitting: *Deleted

## 2015-05-16 DIAGNOSIS — R6883 Chills (without fever): Secondary | ICD-10-CM

## 2015-05-16 DIAGNOSIS — R001 Bradycardia, unspecified: Secondary | ICD-10-CM

## 2015-05-16 LAB — CBC WITH DIFFERENTIAL/PLATELET
BASOS ABS: 0 10*3/uL (ref 0.0–0.1)
Basophils Relative: 0 % (ref 0–1)
EOS PCT: 1 % (ref 0–5)
Eosinophils Absolute: 0.1 10*3/uL (ref 0.0–0.7)
HEMATOCRIT: 39.9 % (ref 39.0–52.0)
HEMOGLOBIN: 13.3 g/dL (ref 13.0–17.0)
LYMPHS ABS: 3 10*3/uL (ref 0.7–4.0)
LYMPHS PCT: 28 % (ref 12–46)
MCH: 29.1 pg (ref 26.0–34.0)
MCHC: 33.3 g/dL (ref 30.0–36.0)
MCV: 87.3 fL (ref 78.0–100.0)
MPV: 10.5 fL (ref 8.6–12.4)
Monocytes Absolute: 1.1 10*3/uL — ABNORMAL HIGH (ref 0.1–1.0)
Monocytes Relative: 10 % (ref 3–12)
NEUTROS ABS: 6.6 10*3/uL (ref 1.7–7.7)
Neutrophils Relative %: 61 % (ref 43–77)
Platelets: 273 10*3/uL (ref 150–400)
RBC: 4.57 MIL/uL (ref 4.22–5.81)
RDW: 14.1 % (ref 11.5–15.5)
WBC: 10.8 10*3/uL — ABNORMAL HIGH (ref 4.0–10.5)

## 2015-05-16 LAB — CUP PACEART INCLINIC DEVICE CHECK
Brady Statistic RA Percent Paced: 1 % — CL
Brady Statistic RV Percent Paced: 1 % — CL
Date Time Interrogation Session: 20170327040000
Implantable Lead Implant Date: 20170315
Implantable Lead Model: 7741
Implantable Lead Model: 7742
Implantable Lead Serial Number: 707950
Lead Channel Impedance Value: 587 Ohm
Lead Channel Pacing Threshold Amplitude: 0.7 V
Lead Channel Pacing Threshold Amplitude: 0.7 V
Lead Channel Pacing Threshold Pulse Width: 0.4 ms
Lead Channel Sensing Intrinsic Amplitude: 5.7 mV
Lead Channel Setting Pacing Amplitude: 3.5 V
Lead Channel Setting Pacing Pulse Width: 0.4 ms
MDC IDC LEAD IMPLANT DT: 20170315
MDC IDC LEAD LOCATION: 753859
MDC IDC LEAD LOCATION: 753860
MDC IDC LEAD SERIAL: 749257
MDC IDC MSMT LEADCHNL RV IMPEDANCE VALUE: 732 Ohm
MDC IDC MSMT LEADCHNL RV PACING THRESHOLD PULSEWIDTH: 0.4 ms
MDC IDC MSMT LEADCHNL RV SENSING INTR AMPL: 25 mV — AB
MDC IDC SET LEADCHNL RA PACING AMPLITUDE: 3.5 V
MDC IDC SET LEADCHNL RV SENSING SENSITIVITY: 2.5 mV
Pulse Gen Serial Number: 718057

## 2015-05-16 LAB — BASIC METABOLIC PANEL
BUN: 8 mg/dL (ref 7–25)
CHLORIDE: 108 mmol/L (ref 98–110)
CO2: 25 mmol/L (ref 20–31)
Calcium: 9.3 mg/dL (ref 8.6–10.3)
Creat: 0.68 mg/dL — ABNORMAL LOW (ref 0.70–1.33)
GLUCOSE: 81 mg/dL (ref 65–99)
POTASSIUM: 4.4 mmol/L (ref 3.5–5.3)
SODIUM: 139 mmol/L (ref 135–146)

## 2015-05-16 NOTE — Progress Notes (Addendum)
Wound check appointment. Steri-strips removed. Wound without redness. Incision edges approximated. Edema noted over device site, soft and tender to palpation. Patient denies fever, states he has had occasional chills. AS saw patient, recommended CBC, BMET, ROV with device clinic in 1 week for wound re-check. Patient aware to call with signs/symptoms of infection in interim.  Patient reports increasing exertional SOB and lower extremity edema over past week. He has not tried taking his PRN Lasix yet. Encouraged patient to take his PRN Lasix x3 days and call primary cardiologist if symptoms do not improve.  Normal device function. Thresholds, sensing, and impedances consistent with implant measurements. Device programmed at 3.5V for extra safety margin until 3 month visit. Histogram distribution appropriate for patient and level of activity. No mode switches or high ventricular rates noted. Patient educated about wound care, arm mobility, lifting restrictions. ROV with device clinic on 05/25/15 for wound recheck and with GT on 08/02/15.

## 2015-05-16 NOTE — Telephone Encounter (Signed)
Pt would like a call back regarding information given last week. Please call.

## 2015-05-16 NOTE — Telephone Encounter (Signed)
Patient called in to see if we had mailed out letter to him. Let him know that he should get it sometime this week and if not to call back.

## 2015-05-18 ENCOUNTER — Ambulatory Visit: Admit: 2015-05-18 | Payer: Self-pay | Admitting: Cardiology

## 2015-05-18 SURGERY — LEFT HEART CATH AND CORONARY ANGIOGRAPHY
Anesthesia: Moderate Sedation | Laterality: Left

## 2015-05-24 ENCOUNTER — Other Ambulatory Visit: Payer: Self-pay

## 2015-05-24 DIAGNOSIS — R001 Bradycardia, unspecified: Secondary | ICD-10-CM

## 2015-05-24 DIAGNOSIS — R55 Syncope and collapse: Secondary | ICD-10-CM

## 2015-05-25 ENCOUNTER — Encounter: Payer: Self-pay | Admitting: *Deleted

## 2015-05-25 ENCOUNTER — Ambulatory Visit (INDEPENDENT_AMBULATORY_CARE_PROVIDER_SITE_OTHER): Payer: Self-pay | Admitting: *Deleted

## 2015-05-25 DIAGNOSIS — Z950 Presence of cardiac pacemaker: Secondary | ICD-10-CM
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
## 2015-05-27 ENCOUNTER — Telehealth: Payer: Self-pay | Admitting: Cardiology

## 2015-05-27 NOTE — Telephone Encounter (Signed)
Patient called in because he had a letter that he didn't understand. After reading the letter he realized that it was related to his pacemaker. He had no further questions for me and stated that he had an appointment with Dr. Alvino ChapelIngal on Monday at 10:00AM and would see us then.

## 2015-05-27 NOTE — Telephone Encounter (Signed)
Tried calling multiple times and it rings then immediately starts beeping like a busy signal.
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
## 2015-05-30 ENCOUNTER — Encounter (INDEPENDENT_AMBULATORY_CARE_PROVIDER_SITE_OTHER): Payer: Self-pay

## 2015-05-30 ENCOUNTER — Encounter: Payer: Self-pay | Admitting: Cardiology

## 2015-05-30 ENCOUNTER — Ambulatory Visit (INDEPENDENT_AMBULATORY_CARE_PROVIDER_SITE_OTHER): Payer: Self-pay | Admitting: Cardiology

## 2015-05-30 VITALS — BP 112/66 | HR 61 | Ht 70.0 in | Wt 241.1 lb

## 2015-05-30 DIAGNOSIS — I495 Sick sinus syndrome: Secondary | ICD-10-CM

## 2015-05-30 DIAGNOSIS — E669 Obesity, unspecified: Secondary | ICD-10-CM

## 2015-05-30 DIAGNOSIS — Z950 Presence of cardiac pacemaker: Secondary | ICD-10-CM
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
## 2015-05-30 NOTE — Patient Instructions (Addendum)
Medication Instructions:  Your physician recommends that you continue on your current medications as directed. Please refer to the Current Medication list given to you today.   Labwork: None ordered  Testing/Procedures: None ordered  Follow-Up: Your physician recommends that you schedule a follow-up appointment in: 4 months with Dr. Yvone Neu  Date & Time:_________________________________________________________   Any Other Special Instructions Will Be Listed Below (If Applicable).     If you need a refill on your cardiac medications before your next appointment, please call your pharmacy.  Physical Activity With Heart Disease Exercising has many benefits, even when you have heart disease. It can help control cholesterol and high blood pressure and improve sleep. It can make your bones and heart muscle stronger. If you exercise about 20-30 minutes per day, 5 times per week, you will be able to do more and feel healthier. Before starting an exercise program, talk with your health care provider about exercising. Your health care provider may recommend a physical exam before starting an exercise program. Your health care provider can match your fitness level with the right exercise program.  HOW CAN [place]? When exercise is done on a regular basis, there are many benefits. Exercise can:  Lower your blood pressure.  Lower your cholesterol.  Control your weight.  Improve your sleep.  Help control your blood sugars.  Improve your heart and lung function.  Strengthen your bones and reduce bone loss.  Reduce your risk of blood clots (thrombophlebitis).  Lower your risk of certain cancers.  Improve your energy level.  Reduce stress.  Make you feel better about yourself. HOW DO I BEGIN A HEART-HEALTHY EXERCISE PROGRAM?  Talk with your health care provider about how often to exercise and what types of exercise would be good for you. Ask your health  care provider if:  You should engage in moderate or vigorous cardiovascular exercise.  There are any exercises you should avoid.  Your medicines should be taken at a certain time.  You should check your pulse or take other precautions during exercise.  Get a calendar. Write down a schedule and plan for your exercise routine.  Consider joining a community exercise program, such as a biking group, yoga class, or swimming pool membership. You can also join a local gym. You can find free workout applications on some phones or devices, or purchase workout DVDs and exercise on your own. Find out what works for you.  After checking with your health care provider about approved activities, try to incorporate a variety of activities into your plan. For instance, you may do a yoga class Tuesdays and Thursdays, walk or climb stairs Mondays and Wednesdays, and then lift small weights while you are watching your favorite show on Sunday night.  If you have not been exercising, begin with sessions that last 10 to 15 minutes. Gradually work up to sessions that last 20 to 30 minutes. Try to exercise 5 times per week. Follow all of your health care provider's recommendations.  Be patient with yourself. It takes time to build up strength and lung capacity. WHAT ARE SOME TYPES OF EXERCISES I COULD TRY?  Cardiovascular exercise gets your heart and lungs working. Depending on your speed and intensity, a cardiovascular activity can be moderate or can become vigorous. Watch your pace and follow your health care provider's recommendations about the intensity of your workouts. Moderate cardiovascular exercise includes:  Walking.  Slow bicycling.  Water aerobics.  Doubles tennis.  Ballroom  dancing.  Light gardening or yard work. Vigorous cardiovascular exercise includes:  Jogging or running.  Jumping rope.  Singles tennis.  Stair climbing.  Swimming laps.  Cross-country skiing.  Hiking  uphill.  Heavy gardening, such as digging trenches. Strengthening exercises tense your muscles to build strength. Some examples include:  Doing push-ups and abdominal work.  Lifting small weights.  Using resistance bands.  Doing exercises with a medicine ball.  Doing pull-ups on a pull-up bar. Flexibility exercises lengthen your muscles to keep them flexible and less tight and improve balance. Some examples include:  Stretching.  Yoga.  Tai chi.  Erenest Rasher barre. WHAT OTHER THINGS WILL HELP ME BE SUCCESSFUL?  If you do not enjoy an activity, try a new one. Keep doing new things until you find exercises that you like.  Drink plenty of water before, during, and after exercise.  Eat meals about 2 hours before you exercise. If you need to snack right before, eat fruit such as a banana or apple.  Wear clothing that is comfortable, fits well, and is appropriate for the weather. Also be sure to wear supportive shoes.  Warm up or stretch for 5 minutes before exercise. Slowly decrease your activity or cool down for 5 minutes after exercise. This can help prevent injury. Ask your health care provider for more information.   Always exercise within your abilities and be aware of what your body is able to handle.  If you belong to a gym, ask the staff to help you learn how to use equipment and exercise machines. Ask a physical therapist or trainer about proper form and techniques to prevent injury. ARE THERE ANY PRECAUTIONS I MIGHT NEED TO TAKE?  Depending on your condition, you may need to work closely with your health care provider or specialist to come up with an approved exercise program. Follow your health care provider's instructions for recovery, cardiac rehabilitation, and a long-term exercise plan.  Because of your heart condition, you are more sensitive to weather and environmental changes and need to be cautious. If there are extreme outdoor conditions, such as heat, humidity,  or cold, you may need to exercise indoors. If chemicals or other pollutants in the air reach an unsafe level and there is an air pollution advisory, you may need to exercise indoors. Your local news, board of health, or hospital can provide information on air quality. Exercise in an indoor, climate-controlled facility if these conditions exist.  Monitor your pulse if directed by your health care provider.  If you feel tired, or have any heart symptoms, such as shortness of breath, dizziness, irregular heartbeat, or chest pains, stop exercising and call your health care provider or 911.  If you have certain types of heart disease, you may need to take extra precautions. These may include:  Avoiding heavy lifting.  Avoiding strenuous activities.  Making sure you include a cool-down period to give your body time to adjust.  Understanding how your medicines can affect you during exercise. Certain medicines may cause heat intolerance and changes in blood sugar.  Knowing your regular symptoms. Slow down and rest when you need to. Call your health care provider if they happen more, last longer, or feel stronger.  Keeping nitroglycerin spray and tabletswith you at all times if you have angina. Use them as directed to prevent and treat symptoms. WHEN SHOULD I SEE MY HEALTH CARE PROVIDER?   You have chest pain, shortness of breath, or feel very tired.  You have pain  in the arm, shoulder, neck, or jaw.  You feel weak, dizzy, or light-headed.   You have an irregular heart rate or your heart rate is greater than 100 beats per minute (bpm) before exercise.   This information is not intended to replace advice given to you by your health care provider. Make sure you discuss any questions you have with your health care provider.   Document Released: 09/02/2013 Document Revised: 06/22/2014  Document Reviewed: 09/02/2013 Elsevier Interactive Patient Education 2016 [place] [employer] Calories are energy you get from the things you eat and drink. Your body uses this energy to keep you going throughout the day. The number of calories you eat affects your weight. When you eat more calories than your body needs, your body stores the extra calories as fat. When you eat fewer calories than your body needs, your body burns fat to get the energy it needs. Calorie counting means keeping track of how many calories you eat and drink each day. If you make sure to eat fewer calories than your body needs, you should lose weight. In order for calorie counting to work, you will need to eat the number of calories that are right for you in a day to lose a healthy amount of weight per week. A healthy amount of weight to lose per week is usually 1-2 lb (0.5-0.9 kg). A dietitian can determine how many calories you need in a day and give you suggestions on how to reach your calorie goal.  WHAT IS MY MY PLAN? My goal is to have __________ calories per day.  If I have this many calories per day, I should lose around __________ pounds per week. WHAT DO I NEED TO KNOW ABOUT CALORIE COUNTING? In order to meet your daily calorie goal, you will need to:  Find out how many calories are in each food you would like to eat. Try to do this before you eat.  Decide how much of the food you can eat.  Write down what you ate and how many calories it had. Doing this is called keeping a food log. WHERE DO I FIND CALORIE INFORMATION? The number of calories in a food can be found on a Nutrition Facts label. Note that all the information on a label is based on a specific serving of the food. If a food does not have a Nutrition Facts label, try to look up the calories online or ask your dietitian for help. HOW DO I DECIDE HOW MUCH TO EAT? To decide how much of the food you can eat, you will need to consider both the number of calories in one serving and the size of one serving. This information  can be found on the Nutrition Facts label. If a food does not have a Nutrition Facts label, look up the information online or ask your dietitian for help. Remember that calories are listed per serving. If you choose to have more than one serving of a food, you will have to multiply the calories per serving by the amount of servings you plan to eat. For example, the label on a package of bread might say that a serving size is 1 slice and that there are 90 calories in a serving. If you eat 1 slice, you will have eaten 90 calories. If you eat 2 slices, you will have eaten 180 calories. HOW DO I KEEP A FOOD LOG? After each meal, record the  following information in your food log:  What you ate.  How much of it you ate.  How many calories it had.  Then, add up your calories. Keep your food log near you, such as in a small notebook in your pocket. Another option is to use a mobile app or website. Some programs will calculate calories for you and show you how many calories you have left each time you add an item to the log. WHAT ARE SOME CALORIE COUNTING TIPS?  Use your calories on foods and drinks that will fill you up and not leave you hungry. Some examples of this include foods like nuts and nut butters, vegetables, lean proteins, and high-fiber foods (more than 5 g fiber per serving).  Eat nutritious foods and avoid empty calories. Empty calories are calories you get from foods or beverages that do not have many nutrients, such as candy and soda. It is better to have a nutritious high-calorie food (such as an avocado) than a food with few nutrients (such as a bag of chips).  Know how many calories are in the foods you eat most often. This way, you do not have to look up how many calories they have each time you eat them.  Look out for foods that may seem like low-calorie foods but are really high-calorie foods, such as baked goods, soda, and fat-free candy.  Pay attention to calories in drinks.  Drinks such as sodas, specialty coffee drinks, alcohol, and juices have a lot of calories yet do not fill you up. Choose low-calorie drinks like water and diet drinks.  Focus your calorie counting efforts on higher calorie items. Logging the calories in a garden salad that contains only vegetables is less important than calculating the calories in a milk shake.  Find a way of tracking calories that works for you. Get creative. Most people who are successful find ways to keep track of how much they eat in a day, even if they do not count every calorie. WHAT ARE SOME PORTION CONTROL TIPS?  Know how many calories are in a serving. This will help you know how many servings of a certain food you can have.  Use a measuring cup to measure serving sizes. This is helpful when you start out. With time, you will be able to estimate serving sizes for some foods.  Take some time to put servings of different foods on your favorite plates, bowls, and cups so you know what a serving looks like.  Try not to eat straight from a bag or box. Doing this can lead to overeating. Put the amount you would like to eat in a cup or on a plate to make sure you are eating the right portion.  Use smaller plates, glasses, and bowls to prevent overeating. This is a quick and easy way to practice portion control. If your plate is smaller, less food can fit on it.  Try not to multitask while eating, such as watching TV or using your computer. If it is time to eat, sit down at a table and enjoy your food. Doing this will help you to start recognizing when you are full. It will also make you more aware of what and how much you are eating. HOW CAN I CALORIE COUNT WHEN EATING OUT?  Ask for smaller portion sizes or child-sized portions.  Consider sharing an entree and sides instead of getting your own entree.  If you get your own entree, eat only half. Ask  for a box at the beginning of your meal and put the rest of your entree in  it so you are not tempted to eat it.  Look for the calories on the menu. If calories are listed, choose the lower calorie options.  Choose dishes that include vegetables, fruits, whole grains, low-fat dairy products, and lean protein. Focusing on smart food choices from each of the 5 food groups can help you stay on track at restaurants.  Choose items that are boiled, broiled, grilled, or steamed.  Choose water, milk, unsweetened iced tea, or other drinks without added sugars. If you want an alcoholic beverage, choose a lower calorie option. For example, a regular margarita can have up to 700 calories and a glass of wine has around 150.  Stay away from items that are buttered, battered, fried, or served with cream sauce. Items labeled "crispy" are usually fried, unless stated otherwise.  Ask for dressings, sauces, and syrups on the side. These are usually very high in calories, so do not eat much of them.  Watch out for salads. Many people think salads are a healthy option, but this is often not the case. Many salads come with bacon, fried chicken, lots of cheese, fried chips, and dressing. All of these items have a lot of calories. If you want a salad, choose a garden salad and ask for grilled meats or steak. Ask for the dressing on the side, or ask for olive oil and vinegar or lemon to use as dressing.  Estimate how many servings of a food you are given. For example, a serving of cooked rice is  cup or about the size of half a tennis ball or one cupcake wrapper. Knowing serving sizes will help you be aware of how much food you are eating at restaurants. The list below tells you how big or small some common portion sizes are based on everyday objects.  1 oz--4 stacked dice.  3 oz--1 deck of cards.  1 tsp--1 dice.  1 Tbsp-- a Ping-Pong ball.  2 Tbsp--1 Ping-Pong ball.   cup--1 tennis ball or 1 cupcake wrapper.  1 cup--1 baseball.   This information is not intended to replace advice  given to you by your health care provider. Make sure you discuss any questions you have with your health care provider.   Document Released: 02/05/2005 Document Revised: 02/26/2014 Document Reviewed: 12/11/2012 Elsevier Interactive Patient Education 2016 [employer] Inc[employer]   Heart-Healthy Eating Plan Heart-healthy meal planning includes:  Limiting unhealthy fats.  Increasing healthy fats.  Making other small dietary changes. You may need to talk with your doctor or a diet specialist (dietitian) to create an eating plan that is right for you. WHAT TYPES OF FAT SHOULD I CHOOSE?  Choose healthy fats. These include olive oil and canola oil, flaxseeds, walnuts, almonds, and seeds.  Eat more omega-3 fats. These include salmon, mackerel, sardines, tuna, flaxseed oil, and ground flaxseeds. Try to eat fish at least twice each week.  Limit saturated fats.  Saturated fats are often found in animal products, such as meats, butter, and cream.  Plant sources of saturated fats include palm oil, palm kernel oil, and coconut oil.  Avoid foods with partially hydrogenated oils in them. These include stick margarine, some tub margarines, cookies, crackers, and other baked goods. These contain trans fats. WHAT GENERAL GUIDELINES DO I NEED TO FOLLOW?  Check food labels carefully. Identify foods with trans fats or high amounts of saturated fat.  Fill one half of  your plate with vegetables and green salads. Eat 4-5 servings of vegetables per day. A serving of vegetables is:  1 cup of raw leafy vegetables.   cup of raw or cooked cut-up vegetables.   cup of vegetable juice.  Fill one fourth of your plate with whole grains. Look for the word "whole" as the first word in the ingredient list.  Fill one fourth of your plate with lean protein foods.  Eat 4-5 servings of fruit per day. A serving of fruit is:  One medium whole fruit.   cup of dried fruit.   cup of fresh, frozen, or canned  fruit.   cup of 100% fruit juice.  Eat more foods that contain soluble fiber. These include apples, broccoli, carrots, beans, peas, and barley. Try to get 20-30 g of fiber per day.  Eat more home-cooked food. Eat less restaurant, buffet, and fast food.  Limit or avoid alcohol.  Limit foods high in starch and sugar.  Avoid fried foods.  Avoid frying your food. Try baking, boiling, grilling, or broiling it instead. You can also reduce fat by:  Removing the skin from poultry.  Removing all visible fats from meats.  Skimming the fat off of stews, soups, and gravies before serving them.  Steaming vegetables in water or broth.  Lose weight if you are overweight.  Eat 4-5 servings of nuts, legumes, and seeds per week:  One serving of dried beans or legumes equals  cup after being cooked.  One serving of nuts equals 1 ounces.  One serving of seeds equals  ounce or one tablespoon.  You may need to keep track of how much salt or sodium you eat. This is especially true if you have high blood pressure. Talk with your doctor or dietitian to get more information. WHAT FOODS CAN I EAT? Grains Breads, including [place], white, pita, wheat, raisin, rye, oatmeal, and [place]. Tortillas that are neither fried nor made with lard or trans fat. Low-fat rolls, including hotdog and hamburger buns and English muffins. Biscuits. Muffins. Waffles. Pancakes. Light popcorn. Whole-grain cereals. Flatbread. Melba toast. Pretzels. Breadsticks. Rusks. Low-fat snacks. Low-fat crackers, including oyster, saltine, matzo, graham, animal, and rye. Rice and pasta, including brown rice and pastas that are made with whole wheat.  Vegetables All vegetables.  Fruits All fruits, but limit coconut. Meats and Other Protein Sources Lean, well-trimmed beef, veal, pork, and lamb. Chicken and [place] without skin. All fish and shellfish. Wild duck, rabbit, pheasant, and venison. Egg whites or low-cholesterol egg  substitutes. Dried beans, peas, lentils, and tofu. Seeds and most nuts. Dairy Low-fat or nonfat cheeses, including ricotta, string, and mozzarella. Skim or 1% milk that is liquid, powdered, or evaporated. Buttermilk that is made with low-fat milk. Nonfat or low-fat yogurt. Beverages Mineral water. Diet carbonated beverages. Sweets and Desserts Sherbets and fruit ices. Honey, jam, marmalade, jelly, and syrups. Meringues and gelatins. Pure sugar candy, such as hard candy, jelly beans, gumdrops, mints, marshmallows, and small amounts of dark chocolate. [employer] Eat all sweets and desserts in moderation. Fats and Oils Nonhydrogenated (trans-free) margarines. Vegetable oils, including soybean, sesame, sunflower, olive, peanut, safflower, corn, canola, and cottonseed. Salad dressings or mayonnaise made with a vegetable oil. Limit added fats and oils that you use for cooking, baking, salads, and as spreads. Other Cocoa powder. Coffee and tea. All seasonings and condiments. The items listed above may not be a complete list of recommended foods or beverages. Contact your dietitian for more options. WHAT FOODS  ARE NOT RECOMMENDED? Grains Breads that are made with saturated or trans fats, oils, or whole milk. Croissants. Butter rolls. Cheese breads. Sweet rolls. Donuts. Buttered popcorn. Chow mein noodles. High-fat crackers, such as cheese or butter crackers. Meats and Other Protein Sources Fatty meats, such as hotdogs, short ribs, sausage, spareribs, bacon, rib eye roast or steak, and mutton. High-fat deli meats, such as salami and bologna. Caviar. Domestic duck and goose. Organ meats, such as kidney, liver, sweetbreads, and heart. Dairy Cream, sour cream, cream cheese, and creamed cottage cheese. Whole-milk cheeses, including blue (bleu), Monte[place] Jack, [place], [place], American, [place], Swiss, cheddar, [place], and [place]. Whole or 2% milk that is liquid, evaporated, or condensed. Whole  buttermilk. Cream sauce or high-fat cheese sauce. Yogurt that is made from whole milk. Beverages Regular sodas and juice drinks with added sugar. Sweets and Desserts Frosting. Pudding. Cookies. Cakes other than angel food cake. Candy that has milk chocolate or white chocolate, hydrogenated fat, butter, coconut, or unknown ingredients. Buttered syrups. Full-fat ice cream or ice cream drinks. Fats and Oils Gravy that has suet, meat fat, or shortening. Cocoa butter, hydrogenated oils, palm oil, coconut oil, palm kernel oil. These can often be found in baked products, candy, fried foods, nondairy creamers, and whipped toppings. Solid fats and shortenings, including bacon fat, salt pork, lard, and butter. Nondairy cream substitutes, such as coffee creamers and sour cream substitutes. Salad dressings that are made of unknown oils, cheese, or sour cream. The items listed above may not be a complete list of foods and beverages to avoid. Contact your dietitian for more information.   This information is not intended to replace advice given to you by your health care provider. Make sure you discuss any questions you have with your health care provider.   Document Released: 08/07/2011 Document Revised: 02/26/2014 Document Reviewed: 07/30/2013 Elsevier Interactive Patient Education [employer].

## 2015-06-02 ENCOUNTER — Encounter: Payer: Self-pay | Admitting: Internal Medicine

## 2015-06-10 ENCOUNTER — Encounter: Payer: Self-pay | Admitting: Internal Medicine

## 2015-06-10 ENCOUNTER — Ambulatory Visit (INDEPENDENT_AMBULATORY_CARE_PROVIDER_SITE_OTHER): Payer: Self-pay | Admitting: Internal Medicine

## 2015-06-10 VITALS — BP 162/82 | HR 72 | Wt 237.4 lb

## 2015-06-10 DIAGNOSIS — I495 Sick sinus syndrome: Secondary | ICD-10-CM

## 2015-06-10 LAB — CUP PACEART INCLINIC DEVICE CHECK
Implantable Lead Implant Date: 20170315
Implantable Lead Location: 753859
Implantable Lead Model: 7742
Implantable Lead Serial Number: 707950
Lead Channel Pacing Threshold Amplitude: 0.6 V
Lead Channel Pacing Threshold Amplitude: 0.7 V
Lead Channel Sensing Intrinsic Amplitude: 6.7 mV
Lead Channel Setting Pacing Amplitude: 3.5 V
Lead Channel Setting Pacing Pulse Width: 0.4 ms
MDC IDC LEAD IMPLANT DT: 20170315
MDC IDC LEAD LOCATION: 753860
MDC IDC LEAD SERIAL: 749257
MDC IDC MSMT LEADCHNL RA IMPEDANCE VALUE: 604 Ohm
MDC IDC MSMT LEADCHNL RA PACING THRESHOLD PULSEWIDTH: 0.4 ms
MDC IDC MSMT LEADCHNL RV IMPEDANCE VALUE: 769 Ohm
MDC IDC MSMT LEADCHNL RV PACING THRESHOLD PULSEWIDTH: 0.4 ms
MDC IDC MSMT LEADCHNL RV SENSING INTR AMPL: 25 mV — AB
MDC IDC PG SERIAL: 718057
MDC IDC SESS DTM: 20170421040000
MDC IDC SET LEADCHNL RA PACING AMPLITUDE: 3.5 V
MDC IDC SET LEADCHNL RV SENSING SENSITIVITY: 2.5 mV

## 2015-06-10 MED ORDER — CARVEDILOL 3.125 MG PO TABS: 3.1250 mg | ORAL_TABLET | Freq: Two times a day (BID) | ORAL | Status: DC

## 2015-06-10 MED ORDER — FUROSEMIDE 40 MG PO TABS: 40.0000 mg | ORAL_TABLET | Freq: Two times a day (BID) | ORAL | Status: DC

## 2015-06-10 NOTE — Patient Instructions (Addendum)
Medication Instructions:  Your physician has recommended you make the following change in your medication:  1) Increase Furosemide to 40 mg twice daily---when fluid is back to normal decrease to once daily 2) Start Carvedilol 3.125mg  twice daily   May to over the counter Robitussin for cough  Labwork: None ordered   Testing/Procedures: None ordered   Follow-Up: Your physician recommends that you schedule a follow-up appointment as scheduled   Any Other Special Instructions Will Be Listed Below (If Applicable).     If you need a refill on your cardiac medications before your next appointment, please call your pharmacy.

## 2015-06-10 NOTE — Progress Notes (Signed)
HPI [name] returns today for follow-up. He is a 50 year old man with a history of syncope, who was found to have 8 second pauses on cardiac monitoring and underwent permanent pacemaker insertion approximately one month ago. Since then, he has had some trouble with edema and has been taking his diuretic medication. His weight is down about 4 lbs since his visit to Dr. Alvino Chapel. He denies chest pain. He has eaten too much salt but he is not adding salt. He notes that he eats at restaurants. No syncope. Allergies  Allergen Reactions  . Bee Venom Anaphylaxis and Swelling  . Peanuts [Peanut Oil] Anaphylaxis  . Influenza Vaccines Other (See Comments)    Seizures     Current Outpatient Prescriptions  Medication Sig Dispense Refill  . aspirin EC 81 MG EC tablet Take 1 tablet (81 mg total) by mouth daily.    Marland Kitchen atorvastatin (LIPITOR) 40 MG tablet Take 40 mg by mouth daily.     . cholecalciferol (VITAMIN D) 1000 units tablet Take 1,000 Units by mouth daily.    . furosemide (LASIX) 20 MG tablet Take 20 mg by mouth daily as needed for fluid or edema.     Marland Kitchen lamoTRIgine (LAMICTAL) 25 MG CHEW chewable tablet Chew 50 mg by mouth at bedtime.     . nitroGLYCERIN (NITROSTAT) 0.4 MG SL tablet Place 1 tablet (0.4 mg total) under the tongue every 5 (five) minutes as needed for chest pain. 25 tablet 3  . omeprazole (PRILOSEC) 20 MG capsule Take 40 mg by mouth daily as needed (for heartburn). Reported on 05/03/2015    . PHENObarbital (LUMINAL) 100 MG tablet Take 200 mg by mouth at bedtime.     . sildenafil (VIAGRA) 50 MG tablet Take 100 mg by mouth daily as needed. Erectile dysfunction     No current facility-administered medications for this visit.     Past Medical History  Diagnosis Date  . Seizure (HCC)   . Depression   . Hyperlipidemia   . Vitamin D deficiency   . Enlarged heart   . Sleep apnea     ROS:   All systems reviewed and negative except as noted in the HPI.   Past Surgical  History  Procedure Laterality Date  . Cardiac catheterization N/A 05/04/2015    Procedure: Left Heart Cath and Coronary Angiography;  Surgeon: Iran Ouch, MD;  Location: [facility] INVASIVE CV LAB;  Service: Cardiovascular;  Laterality: N/A;  . Ep implantable device N/A 05/04/2015    Procedure: Pacemaker Implant;  Surgeon: Marinus Maw, MD;  Location: [facility] INVASIVE CV LAB;  Service: Cardiovascular;  Laterality: N/A;     Family History  Problem Relation Age of Onset  . Hypertension Mother   . Emphysema Mother   . Diabetes      sibling  . Heart disease      sibling     Social History   Social History  . Marital Status: Single    Spouse Name: N/A  . Number of Children: N/A  . Years of Education: N/A   Occupational History  . Not on file.   Social History Main Topics  . Smoking status: Current Every Day Smoker -- 0.50 packs/day for 20 years    Types: Cigarettes  . Smokeless tobacco: Not on file  . Alcohol Use: No  . Drug Use: No  . Sexual Activity: Not on file   Other Topics Concern  . Not on file   Social History  Narrative     BP 162/82 mmHg  Pulse 72  Wt 237 lb 6 oz (107.673 kg)  SpO2 97%  Physical Exam:  Well appearing 50 yo man, NAD HEENT: Unremarkable Neck:  7 cm JVD, no thyromegally Lymphatics:  No adenopathy Back:  No CVA tenderness Lungs:  Clear with no wheezes HEART:  Regular rate rhythm, no murmurs, no rubs, no clicks Abd:  soft, positive bowel sounds, no organomegally, no rebound, no guarding Ext:  2 plus pulses, no edema, no cyanosis, no clubbing Skin:  No rashes no nodules Neuro:  CN II through XII intact, motor grossly intact  DEVICE  Normal device function.  See PaceArt for details.   Assess/Plan: 1. Acute on chronic diastolic heart failure - his symptoms are class 2. He will continue his current meds and increase his lasix to 40 bid until his swelling improves. 2. HTN - His blood pressure is high. I have asked him to start coreg 3.125 mg  twice daily 3. COPD - his has some wheezing today and has been bothered by pollen. I have asked him to continue his bronchodilator. 4. PPM - his Boston device is working normally.  5. Tobacco abuse - he states that he has cut down his smoking intake.  Leonia ReevesGregg Herminia Warren,M.D.

## 2015-07-13 ENCOUNTER — Telehealth: Payer: Self-pay | Admitting: Cardiology

## 2015-07-13 NOTE — Telephone Encounter (Signed)
Seth Tucker from [facility] called and requested that we forward any office visit notes and procedural notes to patients disability lawyer. She provided me with name and number. Seth BartersLisa Tucker [phone]. [phone] Will call [name] to verify this.

## 2015-07-13 NOTE — Telephone Encounter (Signed)
Spoke with Seth Tucker and he requested for us to send the requested notes to his disability lawyer. Let him know to call back if he needs any additional information. He confirmed the contact given by Amil AmenJulia from [facility]. Will forward those notes per his request. All office visit notes, result notes, and procedures routed to [facility]isa Bullard. Phone number [phone], Fax number [phone]

## 2015-08-02 ENCOUNTER — Ambulatory Visit (INDEPENDENT_AMBULATORY_CARE_PROVIDER_SITE_OTHER): Payer: Self-pay | Admitting: Internal Medicine

## 2015-08-02 ENCOUNTER — Encounter: Payer: Self-pay | Admitting: Internal Medicine

## 2015-08-02 VITALS — BP 118/78 | HR 70 | Ht 70.0 in | Wt 237.2 lb

## 2015-08-02 DIAGNOSIS — I495 Sick sinus syndrome: Secondary | ICD-10-CM

## 2015-08-02 NOTE — Patient Instructions (Signed)
Medication Instructions:  Your physician recommends that you continue on your current medications as directed. Please refer to the Current Medication list given to you today.   Labwork: None ordered   Testing/Procedures: None ordered   Follow-Up: Your physician wants you to follow-up in: 9 months with Dr TayloCourt Joyr You will receive a reminder letter in the mail two months in advance. If you don't receive a letter, please call our office to schedule the follow-up appointment.  Remote monitoring is used to monitor your Pacemaker  from home. This monitoring reduces the number of office visits required to check your device to one time per year. It allows us to keep an eye on the functioning of your device to ensure it is working properly. You are scheduled for a device check from home on 11/01/15. You may send your transmission at any time that day. If you have a wireless device, the transmission will be sent automatically. After your physician reviews your transmission, you will receive a postcard with your next transmission date.     Any Other Special Instructions Will Be Listed Below (If Applicable).     If you need a refill on your cardiac medications before your next appointment, please call your pharmacy.

## 2015-08-02 NOTE — Progress Notes (Signed)
HPI [name] returns today for follow-up. He is a 50 year old man with a history of syncope, who was found to have 8 second pauses on cardiac monitoring and underwent permanent pacemaker insertion over 3 months ago. Since then, he has had some trouble with edema and has been taking his diuretic medication and this is improved. He denies chest pain. No syncope. Allergies  Allergen Reactions  . Bee Venom Anaphylaxis and Swelling  . Peanuts [Peanut Oil] Anaphylaxis  . Influenza Vaccines Other (See Comments)    Seizures     Current Outpatient Prescriptions  Medication Sig Dispense Refill  . aspirin EC 81 MG EC tablet Take 1 tablet (81 mg total) by mouth daily.    Marland Kitchen. atorvastatin (LIPITOR) 40 MG tablet Take 40 mg by mouth daily.     . carvedilol (COREG) 3.125 MG tablet Take 1 tablet (3.125 mg total) by mouth 2 (two) times daily. 180 tablet 3  . cholecalciferol (VITAMIN D) 1000 units tablet Take 1,000 Units by mouth daily.    . furosemide (LASIX) 40 MG tablet Take 1 tablet (40 mg total) by mouth 2 (two) times daily. 60 tablet 3  . lamoTRIgine (LAMICTAL) 25 MG CHEW chewable tablet Chew 50 mg by mouth at bedtime.     . nitroGLYCERIN (NITROSTAT) 0.4 MG SL tablet Place 1 tablet (0.4 mg total) under the tongue every 5 (five) minutes as needed for chest pain. 25 tablet 3  . omeprazole (PRILOSEC) 20 MG capsule Take 40 mg by mouth daily as needed (for heartburn). Reported on 05/03/2015    . PHENObarbital (LUMINAL) 100 MG tablet Take 200 mg by mouth at bedtime.     Marland Kitchen. PRESCRIPTION MEDICATION as directed. Antibiotic for pneumonia, pt unsure of name & strength    . sildenafil (VIAGRA) 50 MG tablet Take 100 mg by mouth daily as needed. Erectile dysfunction     No current facility-administered medications for this visit.     Past Medical History  Diagnosis Date  . Seizure (HCC)   . Depression   . Hyperlipidemia   . Vitamin D deficiency   . Enlarged heart   . Sleep apnea     ROS:   All  systems reviewed and negative except as noted in the HPI.   Past Surgical History  Procedure Laterality Date  . Cardiac catheterization N/A 05/04/2015    Procedure: Left Heart Cath and Coronary Angiography;  Surgeon: Iran OuchMuhammad A Arida, MD;  Location: [facility] INVASIVE CV LAB;  Service: Cardiovascular;  Laterality: N/A;  . Ep implantable device N/A 05/04/2015    Procedure: Pacemaker Implant;  Surgeon: Marinus MawGregg W Taylor, MD;  [facility];  Service: Cardiovascular;  Laterality: N/A;     Family History  Problem Relation Age of Onset  . Hypertension Mother   . Emphysema Mother   . Diabetes      sibling  . Heart disease      sibling     Social History   Social History  . Marital Status: Single    Spouse Name: N/A  . Number of Children: N/A  . Years of Education: N/A   Occupational History  . Not on file.   Social History Main Topics  . Smoking status: Current Every Day Smoker -- 0.50 packs/day for 20 years    Types: Cigarettes  . Smokeless tobacco: Not on file  . Alcohol Use: No  . Drug Use: No  . Sexual Activity: Not on file   Other  Topics Concern  . Not on file   Social History Narrative     BP 118/78 mmHg  Pulse 70  Ht  (1.778 m)  Wt 237 lb 3.2 oz (107.593 kg)  BMI 34.03 kg/m2  Physical Exam:  Well appearing 50 yo man, NAD HEENT: Unremarkable Neck:  7 cm JVD, no thyromegally Lymphatics:  No adenopathy Back:  No CVA tenderness Lungs:  Clear with no wheezes HEART:  Regular rate rhythm, no murmurs, no rubs, no clicks Abd:  soft, positive bowel sounds, no organomegally, no rebound, no guarding Ext:  2 plus pulses, no edema, no cyanosis, no clubbing Skin:  No rashes no nodules Neuro:  CN II through XII intact, motor grossly intact  DEVICE  Normal device function.  See PaceArt for details.   Assess/Plan: 1. Acute on chronic diastolic heart failure - his symptoms are class 2. He will continue his current meds and increase his lasix to 40 bid  until his swelling improves. I have asked him to reduce his salt intake. 2. HTN - His blood pressure is normal. I have asked him to continue his blood pressure lowering meds. 3. PPM - his Boston device is working normally.  4. Tobacco abuse - he states that he has cut down his smoking intake.   Leonia Reeves.D.

## 2015-08-05 LAB — CUP PACEART INCLINIC DEVICE CHECK
Date Time Interrogation Session: 20170613040000
Implantable Lead Implant Date: 20170315
Implantable Lead Serial Number: 749257
Lead Channel Pacing Threshold Amplitude: 0.7 V
Lead Channel Pacing Threshold Amplitude: 0.9 V
Lead Channel Pacing Threshold Pulse Width: 0.4 ms
Lead Channel Sensing Intrinsic Amplitude: 25 mV
Lead Channel Setting Pacing Amplitude: 2 V
Lead Channel Setting Pacing Pulse Width: 0.4 ms
Lead Channel Setting Sensing Sensitivity: 2.5 mV
MDC IDC LEAD IMPLANT DT: 20170315
MDC IDC LEAD LOCATION: 753859
MDC IDC LEAD LOCATION: 753860
MDC IDC LEAD SERIAL: 707950
MDC IDC MSMT LEADCHNL RA IMPEDANCE VALUE: 801 Ohm
MDC IDC MSMT LEADCHNL RA PACING THRESHOLD PULSEWIDTH: 0.4 ms
MDC IDC MSMT LEADCHNL RA SENSING INTR AMPL: 6.8 mV
MDC IDC MSMT LEADCHNL RV IMPEDANCE VALUE: 833 Ohm
MDC IDC PG SERIAL: 718057
MDC IDC SET LEADCHNL RV PACING AMPLITUDE: 2.4 V
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
## 2015-09-29 ENCOUNTER — Ambulatory Visit: Payer: Self-pay | Admitting: Cardiology

## 2015-10-03 DIAGNOSIS — J449 Chronic obstructive pulmonary disease, unspecified: Secondary | ICD-10-CM | POA: Insufficient documentation
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
## 2015-10-12 ENCOUNTER — Ambulatory Visit: Payer: Self-pay | Admitting: Cardiology

## 2015-10-12 ENCOUNTER — Encounter: Payer: Self-pay | Admitting: *Deleted

## 2015-11-01 ENCOUNTER — Ambulatory Visit (INDEPENDENT_AMBULATORY_CARE_PROVIDER_SITE_OTHER): Payer: Self-pay | Admitting: *Deleted

## 2015-11-01 DIAGNOSIS — I495 Sick sinus syndrome: Secondary | ICD-10-CM

## 2015-11-01 NOTE — Progress Notes (Signed)
Remote pacemaker transmission.   

## 2015-11-03 ENCOUNTER — Encounter: Payer: Self-pay | Admitting: Cardiology

## 2015-11-15 LAB — CUP PACEART REMOTE DEVICE CHECK
Battery Remaining Percentage: 100 %
Brady Statistic RA Percent Paced: 0 %
Implantable Lead Implant Date: 20170315
Implantable Lead Implant Date: 20170315
Implantable Lead Model: 7742
Implantable Lead Serial Number: 707950
Lead Channel Impedance Value: 913 Ohm
Lead Channel Pacing Threshold Amplitude: 0.5 V
Lead Channel Pacing Threshold Pulse Width: 0.4 ms
Lead Channel Pacing Threshold Pulse Width: 0.4 ms
Lead Channel Setting Pacing Amplitude: 2.4 V
Lead Channel Setting Pacing Pulse Width: 0.4 ms
Lead Channel Setting Sensing Sensitivity: 2.5 mV
MDC IDC LEAD LOCATION: 753859
MDC IDC LEAD LOCATION: 753860
MDC IDC LEAD SERIAL: 749257
MDC IDC MSMT BATTERY REMAINING LONGEVITY: 180 mo
MDC IDC MSMT LEADCHNL RA IMPEDANCE VALUE: 795 Ohm
MDC IDC MSMT LEADCHNL RV PACING THRESHOLD AMPLITUDE: 0.7 V
MDC IDC SESS DTM: 20170912063100
MDC IDC SET LEADCHNL RA PACING AMPLITUDE: 2 V
MDC IDC STAT BRADY RV PERCENT PACED: 0 %
Pulse Gen Serial Number: 718057

## 2015-12-23 ENCOUNTER — Telehealth: Payer: Self-pay | Admitting: Cardiology

## 2015-12-23 ENCOUNTER — Telehealth: Payer: Self-pay | Admitting: Internal Medicine
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
## 2015-12-23 NOTE — Telephone Encounter (Signed)
Pt did schedule to see Dr. Alvino ChapelIngal on 11/7/ at 11:30
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
## 2015-12-24 NOTE — Telephone Encounter (Signed)
Ok. He already had a PPM placed and we can have that interrogated to see if there was any arrhythmia.

## 2015-12-27 ENCOUNTER — Ambulatory Visit (INDEPENDENT_AMBULATORY_CARE_PROVIDER_SITE_OTHER): Payer: Self-pay | Admitting: Cardiology

## 2015-12-27 ENCOUNTER — Encounter: Payer: Self-pay | Admitting: Cardiology

## 2015-12-27 VITALS — BP 132/80 | HR 68 | Ht 70.0 in | Wt 246.1 lb

## 2015-12-27 DIAGNOSIS — I495 Sick sinus syndrome: Secondary | ICD-10-CM

## 2015-12-27 DIAGNOSIS — Z950 Presence of cardiac pacemaker: Secondary | ICD-10-CM

## 2015-12-27 DIAGNOSIS — G4733 Obstructive sleep apnea (adult) (pediatric): Secondary | ICD-10-CM

## 2015-12-27 DIAGNOSIS — R55 Syncope and collapse: Secondary | ICD-10-CM
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
## 2015-12-27 NOTE — Patient Instructions (Signed)
Follow-Up: Your physician recommends that you schedule a follow-up appointment in: 4 months with Dr. Alvino ChapelIngal.  It was a pleasure seeing you today here in the office. Please do not hesitate to give us a call back if you have any further questions. [phone]  Greenfield CellarPamela A. RN, BSN

## 2015-12-28 DIAGNOSIS — S022XXA Fracture of nasal bones, initial encounter for closed fracture: Secondary | ICD-10-CM | POA: Insufficient documentation

## 2015-12-30 ENCOUNTER — Encounter: Payer: Self-pay | Admitting: Internal Medicine

## 2015-12-30 ENCOUNTER — Ambulatory Visit (INDEPENDENT_AMBULATORY_CARE_PROVIDER_SITE_OTHER): Payer: Self-pay | Admitting: Internal Medicine

## 2015-12-30 VITALS — BP 142/80 | HR 62 | Ht 70.0 in | Wt 241.4 lb

## 2015-12-30 DIAGNOSIS — I495 Sick sinus syndrome: Secondary | ICD-10-CM

## 2015-12-30 DIAGNOSIS — Z950 Presence of cardiac pacemaker: Secondary | ICD-10-CM

## 2015-12-30 LAB — CUP PACEART INCLINIC DEVICE CHECK
Implantable Lead Location: 753860
Implantable Lead Model: 7742
Implantable Lead Serial Number: 707950
Lead Channel Impedance Value: 604 Ohm
Lead Channel Impedance Value: 769 Ohm
Lead Channel Pacing Threshold Pulse Width: 0.4 ms
Lead Channel Sensing Intrinsic Amplitude: 6.7 mV
Lead Channel Setting Pacing Amplitude: 3.5 V
MDC IDC LEAD IMPLANT DT: 20170315
MDC IDC LEAD IMPLANT DT: 20170315
MDC IDC LEAD LOCATION: 753859
MDC IDC LEAD SERIAL: 749257
MDC IDC MSMT LEADCHNL RA PACING THRESHOLD AMPLITUDE: 0.6 V
MDC IDC MSMT LEADCHNL RA PACING THRESHOLD PULSEWIDTH: 0.4 ms
MDC IDC MSMT LEADCHNL RV PACING THRESHOLD AMPLITUDE: 0.7 V
MDC IDC MSMT LEADCHNL RV SENSING INTR AMPL: 25 mV — AB
MDC IDC PG IMPLANT DT: 20170315
MDC IDC SESS DTM: 20170421040000
MDC IDC SET LEADCHNL RV PACING AMPLITUDE: 3.5 V
MDC IDC SET LEADCHNL RV PACING PULSEWIDTH: 0.4 ms
MDC IDC SET LEADCHNL RV SENSING SENSITIVITY: 2.5 mV
Pulse Gen Serial Number: 718057

## 2015-12-30 NOTE — Patient Instructions (Addendum)
Medication Instructions:  Your physician recommends that you continue on your current medications as directed. Please refer to the Current Medication list given to you today.   Labwork: None Ordered   Testing/Procedures: None Ordered   Follow-Up: Your physician wants you to follow-up in: 1 year with Dr. Taylor.  You will receive a reminder letter in the mail two months in advance. If you don't receive a letter, please call our office to schedule the follow-up appointment.  Remote monitoring is used to monitor your Pacemaker from home. This monitoring reduces the number of office visits required to check your device to one time per year. It allows us to keep an eye on the functioning of your device to ensure it is working properly. You are scheduled for a device check from home on 04/02/16. You may send your transmission at any time that day. If you have a wireless device, the transmission will be sent automatically. After your physician reviews your transmission, you will receive a postcard with your next transmission date.    Any Other Special Instructions Will Be Listed Below (If Applicable).     If you need a refill on your cardiac medications before your next appointment, please call your pharmacy.   
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
## 2016-03-20 ENCOUNTER — Telehealth: Payer: Self-pay | Admitting: Internal Medicine

## 2016-03-20 NOTE — Telephone Encounter (Signed)
Pt states he had a pacer inserted last year by Dr. Ladona Ridgelaylor. Pt states he is concerned that it is very close to the surface of his skin, and he is worried about this. He states he can "feel it more that I use to be able to". Please call and advise if this is normal. States he can "feel the grooves in it".

## 2016-04-02 ENCOUNTER — Ambulatory Visit (INDEPENDENT_AMBULATORY_CARE_PROVIDER_SITE_OTHER): Payer: Self-pay | Admitting: *Deleted

## 2016-04-02 DIAGNOSIS — I495 Sick sinus syndrome: Secondary | ICD-10-CM

## 2016-04-03 ENCOUNTER — Encounter: Payer: Self-pay | Admitting: Cardiology

## 2016-04-03 LAB — CUP PACEART REMOTE DEVICE CHECK
Battery Remaining Longevity: 180 mo
Battery Remaining Percentage: 100 %
Brady Statistic RV Percent Paced: 0 %
Implantable Lead Implant Date: 20170315
Implantable Lead Location: 753859
Implantable Lead Model: 7741
Implantable Lead Model: 7742
Implantable Lead Serial Number: 707950
Implantable Pulse Generator Implant Date: 20170315
Lead Channel Impedance Value: 703 Ohm
Lead Channel Pacing Threshold Amplitude: 0.7 V
Lead Channel Setting Pacing Amplitude: 2 V
Lead Channel Setting Pacing Pulse Width: 0.4 ms
Lead Channel Setting Sensing Sensitivity: 0.6 mV
MDC IDC LEAD IMPLANT DT: 20170315
MDC IDC LEAD LOCATION: 753860
MDC IDC LEAD SERIAL: 749257
MDC IDC MSMT LEADCHNL RA PACING THRESHOLD PULSEWIDTH: 0.4 ms
MDC IDC MSMT LEADCHNL RV IMPEDANCE VALUE: 747 Ohm
MDC IDC MSMT LEADCHNL RV PACING THRESHOLD AMPLITUDE: 0.8 V
MDC IDC MSMT LEADCHNL RV PACING THRESHOLD PULSEWIDTH: 0.4 ms
MDC IDC PG SERIAL: 718057
MDC IDC SESS DTM: 20180212073000
MDC IDC SET LEADCHNL RV PACING AMPLITUDE: 2.4 V
MDC IDC STAT BRADY RA PERCENT PACED: 0 %

## 2016-04-03 NOTE — Progress Notes (Signed)
Remote pacemaker transmission.   

## 2016-04-26 ENCOUNTER — Ambulatory Visit (INDEPENDENT_AMBULATORY_CARE_PROVIDER_SITE_OTHER): Payer: Self-pay | Admitting: Cardiology

## 2016-04-26 ENCOUNTER — Encounter: Payer: Self-pay | Admitting: Cardiology

## 2016-04-26 VITALS — BP 138/82 | HR 72 | Ht 70.0 in | Wt 255.0 lb

## 2016-04-26 DIAGNOSIS — E6609 Other obesity due to excess calories: Secondary | ICD-10-CM

## 2016-04-26 DIAGNOSIS — I495 Sick sinus syndrome: Secondary | ICD-10-CM

## 2016-04-26 DIAGNOSIS — Z950 Presence of cardiac pacemaker: Secondary | ICD-10-CM

## 2016-04-26 DIAGNOSIS — Z6836 Body mass index (BMI) 36.0-36.9, adult: Secondary | ICD-10-CM

## 2016-04-26 DIAGNOSIS — R001 Bradycardia, unspecified: Secondary | ICD-10-CM

## 2016-04-26 DIAGNOSIS — R55 Syncope and collapse: Secondary | ICD-10-CM

## 2016-04-26 DIAGNOSIS — M7989 Other specified soft tissue disorders: Secondary | ICD-10-CM

## 2016-04-26 NOTE — Progress Notes (Signed)
Cardiology Office Note   Date:  04/26/2016   ID:  Seth Tucker, DOB 03/25/1965, MRN [id number]  Referring Doctor: Dr. Adalberto Ill  Cardiologist:   Almond Lint, MD   Reason for consultation:  Chief Complaint  Patient presents with  . other    70mo f/u.    Follow-up for history of sinus bradycardia, nonobstructive CAD   History of Present Illness: Seth Tucker is a 51 y.o. male who presents for ffup For history of bradycardia and nonobstructive CAD  Since last visit, has no recurrence of passing out.  He feels that the Lasix is not working and he has some swelling in his feet. When asked about diet, he does report taking and probably sodium laden food including sausages, meats. Patient denies PND, orthopnea. No exertional chest pain.  ROS:  Please see the history of present illness. Aside from mentioned under HPI, all other systems are reviewed and negative.     Past Medical History:  Diagnosis Date  . Depression   . Enlarged heart   . Hyperlipidemia   . Seizure (HCC)   . Sleep apnea   . Vitamin D deficiency     Past Surgical History:  Procedure Laterality Date  . CARDIAC CATHETERIZATION N/A 05/04/2015   Procedure: Left Heart Cath and Coronary Angiography;  Surgeon: Iran Ouch, MD;  Location: [facility];  Service: Cardiovascular;  Laterality: N/A;  . EP IMPLANTABLE DEVICE N/A 05/04/2015   Procedure: Pacemaker Implant;  Surgeon: Marinus Maw, MD;  Location: [facility];  Service: Cardiovascular;  Laterality: N/A;     reports that he has been smoking Cigarettes.  He has a 10.00 pack-year smoking history. He has never used smokeless tobacco. He reports that he does not drink alcohol or use drugs.   family history includes Emphysema in his mother; Hypertension in his brother and mother.   Current Outpatient Prescriptions  Medication Sig Dispense Refill  . albuterol (PROVENTIL HFA;VENTOLIN HFA) 108 (90 Base) MCG/ACT inhaler Inhale 2 puffs  into the lungs every 4 (four) hours as needed for wheezing or shortness of breath.    Marland Kitchen aspirin EC 81 MG EC tablet Take 1 tablet (81 mg total) by mouth daily.    . carvedilol (COREG) 3.125 MG tablet Take 1 tablet (3.125 mg total) by mouth 2 (two) times daily. 180 tablet 3  . cholecalciferol (VITAMIN D) 1000 units tablet Take 1,000 Units by mouth daily.    . Fluticasone-Salmeterol (ADVAIR DISKUS) 250-50 MCG/DOSE AEPB Inhale 1 puff into the lungs 2 (two) times daily.    . furosemide (LASIX) 40 MG tablet Take 1 tablet (40 mg total) by mouth 2 (two) times daily. 60 tablet 3  . ibuprofen (ADVIL,MOTRIN) 800 MG tablet Take 800 mg by mouth every 8 (eight) hours as needed (pain).    Marland Kitchen lamoTRIgine (LAMICTAL) 25 MG CHEW chewable tablet Chew 50 mg by mouth at bedtime.     . nitroGLYCERIN (NITROSTAT) 0.4 MG SL tablet Place 1 tablet (0.4 mg total) under the tongue every 5 (five) minutes as needed for chest pain. 25 tablet 3  . omeprazole (PRILOSEC) 20 MG capsule Take 40 mg by mouth daily as needed (for heartburn). Reported on 05/03/2015    . PHENObarbital (LUMINAL) 100 MG tablet Take 200 mg by mouth at bedtime.     Marland Kitchen atorvastatin (LIPITOR) 40 MG tablet Take 40 mg by mouth daily.      No current facility-administered medications for this visit.  Allergies: Bee venom; Hornet venom; Peanuts [peanut oil]; Phenytoin; Valproic acid; Influenza vaccines; and Other    PHYSICAL EXAM: VS:  BP 138/82 (BP Location: Left Arm, Patient Position: Sitting, Cuff Size: Normal)   Pulse 72   Ht 5\' 10"  (1.778 m)   Wt 255 lb (115.7 kg)   BMI 36.59 kg/m  , Body mass index is 36.59 kg/m. Wt Readings from Last 3 Encounters:  04/26/16 255 lb (115.7 kg)  12/30/15 241 lb 6.4 oz (109.5 kg)  12/27/15 246 lb 1.9 oz (111.6 kg)    GENERAL:  well developed, well nourished, obese, not in acute distress HEENT: normocephalic, pink conjunctivae, anicteric sclerae, no xanthelasma, normal dentition, oropharynx clear NECK:  no neck vein  engorgement, JVP normal, no hepatojugular reflux, carotid upstroke brisk and symmetric, no bruit, no thyromegaly, no lymphadenopathy LUNGS:  good respiratory effort, clear to auscultation bilaterally CV:  PMI not displaced, no thrills, no lifts, S1 and S2 within normal limits, no palpable S3 or S4, no murmurs, no rubs, no gallops. Incision for PPM healing well, no abnormal discharge. ABD:  Soft, nontender, nondistended, normoactive bowel sounds, no abdominal aortic bruit, no hepatomegaly, no splenomegaly MS: nontender back, no kyphosis, no scoliosis, no joint deformities EXT:  2+ DP/PT pulses, ++ edema, no varicosities, no cyanosis, no clubbing SKIN: warm, nondiaphoretic, normal turgor, no ulcers NEUROPSYCH: alert, oriented to person, place, and time, sensory/motor grossly intact, normal mood, appropriate affect   Recent Labs: 04/12/2015: ALT 16 04/13/2015: BUN 9; Creatinine, Ser 0.78; Hemoglobin 14.0; Platelets 256; Potassium 4.2; Sodium 139   Lipid Panel    Component Value Date/Time   CHOL 158 05/04/2015 0240   TRIG 47 05/04/2015 0240   HDL 51 05/04/2015 0240   CHOLHDL 3.1 05/04/2015 0240   VLDL 9 05/04/2015 0240   LDLCALC 98 05/04/2015 0240     Other studies Reviewed:  EKG:  EKG from 05/03/2015 was personally reviewed by me and it showed sinus rhythm, 64 BPM. Minimal voltage criteria for LVH.  EKG 04/28/2015:  sinus rhythm, 63 BPM. Moderate voltage criteria for LVH.  Additional studies/ records that were reviewed personally reviewed by me today include:    Echo: 04/13/2015: LV cavity size was normal. Mild focal basal hypertrophy of the septum. EF 60-65%. Normal wall motion. Mild PI. Aortic root size was normal.  Ex nuc stress test 04/28/2015:   Blood pressure demonstrated a hypertensive response to exercise. Peak blood pressure with exercise was 250/72  There was no ST segment deviation noted during stress.  Defect 1: There is a medium defect of moderate severity present in the  mid inferoseptal, mid inferior and mid anterolateral location.  Findings consistent with ischemia in the RCA and diagonal distribution. However, this study is suboptimal due to intense GI uptake. Correlate clinically.  This is an intermediate risk study.  The left ventricular ejection fraction is normal (55-65%).   LHC 05/04/2015:  Ost LAD to Prox LAD lesion, 10% stenosed.  Prox Cx lesion, 10% stenosed.  The left ventricular systolic function is normal.  1. Minor luminal irregularities with no evidence of obstructive coronary artery disease. 2. Normal LV systolic function. 3. Mildly elevated left ventricular end-diastolic pressure likely due to diastolic dysfunction.  Event monitor 05/24/2015:  Sinus pauses of 8 seconds, and 4 seconds  Monitoring period was 04/22/2015 2 05/21/2015.  Baseline rhythm from 04/22/2015 showed sinus bradycardia at 58 BPM.  From 05/03/2015, automatic transmission showed sinus rhythm with an 8 second pause, and 4 second pause. Patient was  contacted at this time. Patient did not volunteer any symptoms. Upon prodding, he did mention some chest pain. Because of the significant pause, patient was [facility] evaluation for pacemaker placement.  No other events recorded.   ASSESSMENT AND PLAN:  Symptomatic bradycardia/sinus node dysfunction S/p Boston Sci dual chamber PPM 05/04/2015, Dr. Ladona Ridgelaylor Patient continues to follow-up with the EP for this.  Chest pain, Shortness of breath - no recurrence Recommendations similar to last visit 12/27/2015:  Abnormal stress test (likely fasle positive) Stress test from 04/28/2015 showed a medium defect, moderate severity, in the mid inferoseptal, mid inferior and mid anterolateral location, consistent with ischemia.  LHC 05/04/2015:  Ost LAD to Prox LAD lesion, 10% stenosed.  Prox Cx lesion, 10% stenosed. Left heart catheterization did not show significant CAD.  Discussed with patient. Continue aspirin, statin therapy. Ideal LDL goal is less than 70. Continue blood pressure control.  Leg swelling We will check BMP today Likely related to dietary indiscretion with high sodium intake Patient verbalized understanding. Will monitor diet. Depending on BMP, we may be able to increase his Lasix.  Obesity Body mass index is 36.59 kg/m.Marland Kitchen. Recommend aggressive weight loss through diet and increased physical activity.   Current medicines are reviewed at length with the patient today.  The patient does not have concerns regarding medicines.  Labs/ tests ordered today include:  Orders Placed This Encounter  Procedures  . Basic metabolic panel  . EKG 12-Lead    I had a lengthy and detailed discussion with the patient regarding diagnoses, prognosis, diagnostic options, treatment options.  I counseled the patient on importance of lifestyle modification including heart healthy diet, regular physical activity, and smoking cessation.   Disposition:   FU with undersigned in 3 months  Signed, Almond LintAileen Breanne Olvera, MD  04/26/2016 5:34 PM    [facility]

## 2016-04-26 NOTE — Patient Instructions (Signed)
Medication Instructions:  No changes at this time.  Labwork: We will call you with these results  Testing/Procedures: None at this time  Follow-Up: Your physician wants you to follow-up in: 3 months. You will receive a reminder letter in the mail two months in advance. If you don't receive a letter, please call our office to schedule the follow-up appointment.  It was a pleasure seeing you today here in the office. Please do not hesitate to give us a call back if you have any further questions. [phone]  [place] CellarPamela A. RN, BSN

## 2016-04-27 LAB — BASIC METABOLIC PANEL
BUN / CREAT RATIO: 17 (ref 9–20)
BUN: 13 mg/dL (ref 6–24)
CO2: 16 mmol/L — AB (ref 18–29)
CREATININE: 0.76 mg/dL (ref 0.76–1.27)
Calcium: 9.9 mg/dL (ref 8.7–10.2)
Chloride: 105 mmol/L (ref 96–106)
GFR, EST AFRICAN AMERICAN: 121 mL/min/{1.73_m2} (ref >59)
GFR, EST NON AFRICAN AMERICAN: 105 mL/min/{1.73_m2} (ref >59)
Glucose: 112 mg/dL — ABNORMAL HIGH (ref 65–99)
Potassium: 5.1 mmol/L (ref 3.5–5.2)
SODIUM: 143 mmol/L (ref 134–144)

## 2016-05-28 ENCOUNTER — Telehealth: Payer: Self-pay | Admitting: Internal Medicine

## 2016-05-28 NOTE — Telephone Encounter (Signed)
Returned call to patient. He says his Lasix is not working as well as it used to. Now he does not urinate very much at all when he takes it. He complains of SOB with exertion and lower extremity swelling in his legs and ankles. The swelling goes down just a little bit at night but not as much as it used to. He has occasional sharp, quick chest pains that only last a couple seconds. Sometimes they happen 1-4 times a day, sometimes he does not have them at all. He does not have a BP cuff at home and has not kept track of his weights. Patient is taking medications as prescribed on MAR including furosemide 40 mg by mouth twice a day.   Patient said the doctor mentioned another medication he could take for the swelling at his last OV. He would like to know what he can take because he does not feel the lasix is working anymore. Advised patient to take daily weights if possible and monitor BP. He said he thinks he can find a scale around but not sure about BP monitor. Advised patient to call 911 with chest pain or SOB worsen or new symptoms arise in the event of emergency. He verbalize understanding.  Will route to Dr Alvino Chapel for advice.

## 2016-05-28 NOTE — Telephone Encounter (Signed)
Reviewed note: We can do trial with lasix am,  pm x 5 days then back to  bid.  As mentioned previously, need to restrict sodium in diet

## 2016-05-28 NOTE — Telephone Encounter (Signed)
Pt states Dr. Alvino Chapel was to change his Lasix-not working- to something she said was better, but he never heard back, legs and feet still swelling. Uses [facility] [address] in [place]. Pt [phone]

## 2016-05-29 NOTE — Telephone Encounter (Signed)
Reviewed recommendations with patient to take Lasix 80 mg in the AM and 40 mg in the PM for 5 days and then go back to Lasix 40 mg twice a day. Discussed importance of avoiding high sodium foods and not adding additional salt to his meals. Instructed him to call back if his symptoms persist or worsen. He verbalized understanding of all instructions, agreement with plan, and had no further questions at this time.

## 2016-06-12 ENCOUNTER — Encounter: Payer: Self-pay | Admitting: Internal Medicine

## 2016-06-13 ENCOUNTER — Encounter: Payer: Self-pay | Admitting: Internal Medicine

## 2016-07-31 ENCOUNTER — Ambulatory Visit (INDEPENDENT_AMBULATORY_CARE_PROVIDER_SITE_OTHER): Payer: Self-pay | Admitting: Internal Medicine

## 2016-07-31 ENCOUNTER — Encounter: Payer: Self-pay | Admitting: Internal Medicine

## 2016-07-31 VITALS — BP 130/76 | HR 64 | Ht 70.0 in | Wt 251.8 lb

## 2016-07-31 DIAGNOSIS — R600 Localized edema: Secondary | ICD-10-CM | POA: Insufficient documentation

## 2016-07-31 DIAGNOSIS — R0789 Other chest pain: Secondary | ICD-10-CM

## 2016-07-31 DIAGNOSIS — R0609 Other forms of dyspnea: Secondary | ICD-10-CM

## 2016-07-31 DIAGNOSIS — I495 Sick sinus syndrome: Secondary | ICD-10-CM | POA: Insufficient documentation

## 2016-07-31 DIAGNOSIS — I2510 Atherosclerotic heart disease of native coronary artery without angina pectoris: Secondary | ICD-10-CM

## 2016-07-31 NOTE — Progress Notes (Signed)
Follow-up Outpatient Visit Date: 07/31/2016  Primary Care Provider: Lottie Rater, MD [address] [place] [address]  Chief Complaint: Chest pain, shortness of breath, and edema  HPI:  [name] is a 51 y.o. year-old male with history of sick sinus syndrome status post dual-chamber pacemaker, hypertension, hyperlipidemia, sleep apnea, and depression, who presents for follow-up of chest pain, shortness of breath, and leg swelling. He was previously followed in our office by Dr. Alvino Chapel, having last been seen on 04/26/16. At that time, he complained of leg swelling and was placed on increasing doses of furosemide. Today, he notes that his legs continued to be swollen, which is typically dependent. He has been trying to limit his salt intake. He is not wearing compression stockings on a regular basis. He was previously diagnosed with sleep apnea but only wears his CPAP machine 2-3 nights per week. He endorses chronic 2 pillow orthopnea as well as intermittent PND. He continues to have occasional chest pain that happens randomly throughout the day. It is not exertional and often occurs when he has not slept well the night before. He has accompanying shortness of breath, worsened with exertion. He notes that his weight has been relatively stable, though he still is unhappy with his current weight. He reports being compliant with his medications. He has been unable to follow up in the device clinic due to transportation issues, and requests being seen in the [place] office. He has felt occasional vibrations from his device. He is also concerned that his device seems to be very close to the skin surface.  --------------------------------------------------------------------------------------------------  Cardiovascular History & Procedures: Cardiovascular Problems:  Atypical chest pain  Sick sinus syndrome status post dual-chamber pacemaker  Chronic leg edema  Risk  Factors:  Nonobstructive coronary artery disease, hypertension, hyperlipidemia, obesity, male gender, and sedentary lifestyle  Cath/PCI:  LHC (05/04/15): Minor luminal irregularities without evidence of obstructive coronary artery disease. Normal LV function. Mildly elevated left ventricular Julio Storr diastolic  CV Surgery:  None  EP Procedures and Devices:  Dual-chamber pacemaker (05/04/15, Dr. Ladona Ridgel): [facility] [employer]  Non-Invasive Evaluation(s):  Exercise myocardial perfusion stress test (04/28/15): Intermediate risk study with moderate in size, moderate in severity, mid inferoseptal, mid inferior, and mid anterolateral segments consistent with ischemia in the RCA and diagonal distributions. LVEF 55-65%. Hypertensive response to exercise.  TTE (04/13/15): Normal LV size with mild focal basal hypertrophy of the septum. LVEF 60-65% with normal wall motion. Normal RV size and function. No significant valvular abnormalities.  Recent CV Pertinent Labs: Lab Results  Component Value Date   CHOL 158 05/04/2015   HDL 51 05/04/2015   LDLCALC 98 05/04/2015   TRIG 47 05/04/2015   CHOLHDL 3.1 05/04/2015   INR 1.06 05/03/2015   K 5.1 04/26/2016   BUN 13 04/26/2016   CREATININE 0.76 04/26/2016   CREATININE 0.68 (L) 05/16/2015    Past medical and surgical history were reviewed and updated in EPIC.  Outpatient Encounter Prescriptions as of 07/31/2016  Medication Sig  . albuterol (PROVENTIL HFA;VENTOLIN HFA) 108 (90 Base) MCG/ACT inhaler Inhale 2 puffs into the lungs every 4 (four) hours as needed for wheezing or shortness of breath.  Marland Kitchen aspirin EC 81 MG EC tablet Take 1 tablet (81 mg total) by mouth daily.  Marland Kitchen atorvastatin (LIPITOR) 40 MG tablet Take 40 mg by mouth daily.   . carvedilol (COREG) 3.125 MG tablet Take 1 tablet (3.125 mg total) by mouth 2 (two) times daily.  . cholecalciferol (VITAMIN  D) 1000 units tablet Take 1,000 Units by mouth daily.  Marland Kitchen. FLUoxetine (PROZAC) 40 MG capsule Take 40  mg by mouth daily.  . Fluticasone-Salmeterol (ADVAIR DISKUS) 250-50 MCG/DOSE AEPB Inhale 1 puff into the lungs 2 (two) times daily.  . furosemide (LASIX) 40 MG tablet Take 1 tablet (40 mg total) by mouth 2 (two) times daily.  Marland Kitchen. ibuprofen (ADVIL,MOTRIN) 800 MG tablet Take 800 mg by mouth every 8 (eight) hours as needed (pain).  Marland Kitchen. lamoTRIgine (LAMICTAL) 25 MG CHEW chewable tablet Chew 50 mg by mouth at bedtime.   . nitroGLYCERIN (NITROSTAT) 0.4 MG SL tablet Place 1 tablet (0.4 mg total) under the tongue every 5 (five) minutes as needed for chest pain.  Marland Kitchen. omeprazole (PRILOSEC) 20 MG capsule Take 40 mg by mouth daily as needed (for heartburn). Reported on 05/03/2015  . PHENObarbital (LUMINAL) 100 MG tablet Take 200 mg by mouth at bedtime.    No facility-administered encounter medications on file as of 07/31/2016.     Allergies: Bee venom; Hornet venom; Peanuts [peanut oil]; Phenytoin; Valproic acid; Influenza vaccines; and Other  Social History   Social History  . Marital status: Single    Spouse name: N/A  . Number of children: N/A  . Years of education: N/A   Occupational History  . Not on file.   Social History Main Topics  . Smoking status: Current Every Day Smoker    Packs/day: 0.50    Years: 20.00    Types: Cigarettes  . Smokeless tobacco: Never Used     Comment: 6 a day.  . Alcohol use No  . Drug use: No  . Sexual activity: Not on file   Other Topics Concern  . Not on file   Social History Narrative  . No narrative on file    Family History  Problem Relation Age of Onset  . Hypertension Mother   . Emphysema Mother   . Diabetes Unknown        sibling  . Heart disease Unknown        sibling  . Hypertension Brother     Review of Systems: A 12-system review of systems was performed and was negative except as noted in the HPI.  --------------------------------------------------------------------------------------------------  Physical Exam: BP 130/76 (BP  Location: Left Arm, Patient Position: Sitting, Cuff Size: Normal)   Pulse 64   Ht 5\' 10"  (1.778 m)   Wt 251 lb 12 oz (114.2 kg)   BMI 36.12 kg/m   General:  Obese man, seated comfortably in the exam room. HEENT: No conjunctival pallor or scleral icterus.  Moist mucous membranes.  OP clear. Neck: Supple without lymphadenopathy, thyromegaly, JVD, or HJR.  No carotid bruit. Lungs: Normal work of breathing.  Clear to auscultation bilaterally without wheezes or crackles. Heart: Regular rate and rhythm without murmurs, rubs, or gallops.  Non-displaced PMI. Abd: Bowel sounds present.  Soft, NT/ND without hepatosplenomegaly Ext: 1-2+ edema to the mid calves.  Radial, PT, and DP pulses are 2+ bilaterally. Skin: Warm and dry without rash. Left upper chest pacemaker pocket is well healed. Pacemaker is palpable but does not protrude significantly under the skin surface.  EKG:  NSR without abnormalities.  Lab Results  Component Value Date   WBC 10.8 (H) 05/16/2015   HGB 13.3 05/16/2015   HCT 39.9 05/16/2015   MCV 87.3 05/16/2015   PLT 273 05/16/2015    Lab Results  Component Value Date   NA 143 04/26/2016   K 5.1 04/26/2016  CL 105 04/26/2016   CO2 16 (L) 04/26/2016   BUN 13 04/26/2016   CREATININE 0.76 04/26/2016   GLUCOSE 112 (H) 04/26/2016   ALT 17 05/04/2015    Lab Results  Component Value Date   CHOL 158 05/04/2015   HDL 51 05/04/2015   LDLCALC 98 05/04/2015   TRIG 47 05/04/2015   CHOLHDL 3.1 05/04/2015   --------------------------------------------------------------------------------------------------  ASSESSMENT AND PLAN: Atypical chest pain Patient continues to have atypical chest pain that has been a chronic problem for him. Given cardiac catheterization last year with minimal CAD, I question if this is related to microvascular disease or a noncardiac etiology. We have discussed further treatment options and have agreed to defer additional therapy at this time. He  should continue low-dose carvedilol.  Dyspnea on exertion This is likely multifactorial, including underlying lung disease and untreated sleep apnea. An element of diastolic dysfunction is certainly possible. We have agreed to check a basic metabolic panel today. If his renal function and electrolytes are normal, we will increase furosemide to 80 mg twice a day for one week to see if this improves his symptoms. I have advised him to stop smoking and wear his CPAP on a nightly basis.  Sinus node dysfunction status post dual-chamber pacemaker Pacemaker pocket appears well healed. Most recent device interrogation in February was unrevealing. We will refer the patient to the device clinic in [place] to establish care and ensure that he receives appropriate monitoring of his pacemaker.  Bilateral lower extremity edema I suspect this is multifactorial, including some degree of venous insufficiency, obesity, and possible diastolic dysfunction and/or right heart failure. We will check a basic metabolic panel today in anticipation of increasing furosemide to 80 mg twice a day 1 week. I have encouraged Mr. Moder follow a low-salt diet and to purchase over-the-counter compression stockings (he is unable to afford prescription stockings).  Follow-up: Return to clinic in 3 months.  Yvonne Kendall, MD 07/31/2016 2:50 PM

## 2016-07-31 NOTE — Patient Instructions (Addendum)
Medication Instructions:  Your physician recommends that you continue on your current medications as directed. Please refer to the Current Medication list given to you today.   Labwork: Your physician recommends that you return for lab work in: TODAY (BMP).  Testing/Procedures: NONE  Follow-Up: Your physician recommends that you schedule a follow-up appointment in: AT NEXT AVAILABLE WITH DR Graciela HusbandsKLEIN OR DEVICE CLINIC.  Your physician recommends that you schedule a follow-up appointment in: 3 MONTHS WITH DR END.   Any Other Special Instructions Will Be Listed Below (If Applicable).  Dr End recommends you get some over-the- counter compression stockings. You can find these at your local pharmacy or drug store. Some stores carry them as well.   If you need a refill on your cardiac medications before your next appointment, please call your pharmacy.

## 2016-08-01 ENCOUNTER — Other Ambulatory Visit: Payer: Self-pay | Admitting: *Deleted

## 2016-08-01 LAB — BASIC METABOLIC PANEL
BUN / CREAT RATIO: 13 (ref 9–20)
BUN: 9 mg/dL (ref 6–24)
CHLORIDE: 105 mmol/L (ref 96–106)
CO2: 22 mmol/L (ref 20–29)
Calcium: 9.3 mg/dL (ref 8.7–10.2)
Creatinine, Ser: 0.72 mg/dL — ABNORMAL LOW (ref 0.76–1.27)
GFR calc non Af Amer: 107 mL/min/{1.73_m2} (ref >59)
GFR, EST AFRICAN AMERICAN: 124 mL/min/{1.73_m2} (ref >59)
GLUCOSE: 95 mg/dL (ref 65–99)
POTASSIUM: 4.1 mmol/L (ref 3.5–5.2)
SODIUM: 140 mmol/L (ref 134–144)

## 2016-08-01 MED ORDER — FUROSEMIDE 40 MG PO TABS: ORAL_TABLET | ORAL | 3 refills | Status: DC

## 2016-08-01 MED ORDER — POTASSIUM CHLORIDE CRYS ER 20 MEQ PO TBCR: EXTENDED_RELEASE_TABLET | ORAL | 0 refills | Status: DC

## 2016-09-11 ENCOUNTER — Encounter: Payer: Self-pay | Admitting: Internal Medicine

## 2016-09-13 ENCOUNTER — Encounter: Payer: Self-pay | Admitting: Internal Medicine

## 2016-11-07 ENCOUNTER — Ambulatory Visit: Payer: Self-pay | Admitting: Internal Medicine

## 2016-11-07 NOTE — Progress Notes (Deleted)
Follow-up Outpatient Visit Date: 11/07/2016  Primary Care Provider: Lottie Rater, MD [address] [place] [address]  Chief Complaint: ***  HPI:  Seth Tucker is a 51 y.o. year-old male with history of sick sinus syndrome status post dual-chamber pacemaker, hypertension, hyperlipidemia, sleep apnea, and depression, who presents for follow-up of chest pain and shortness of breath. I last saw him in June, at which time he continued to complain of dependent edema. He was not wearing compression stockings on a regular basis. He was also only using CPAP 2-3 nights per week. We agreed to increase furosemide to 40 mg twice a day for one week.  --------------------------------------------------------------------------------------------------  Cardiovascular History & Procedures: Cardiovascular Problems:  ***  Risk Factors:  ***  Cath/PCI:  ***  CV Surgery:  ***  EP Procedures and Devices:  ***  Non-Invasive Evaluation(s):  ***  Recent CV Pertinent Labs: Lab Results  Component Value Date   CHOL 158 05/04/2015   HDL 51 05/04/2015   LDLCALC 98 05/04/2015   TRIG 47 05/04/2015   CHOLHDL 3.1 05/04/2015   INR 1.06 05/03/2015   K 4.1 07/31/2016   BUN 9 07/31/2016   CREATININE 0.72 (L) 07/31/2016   CREATININE 0.68 (L) 05/16/2015    Past medical and surgical history were reviewed and updated in EPIC.  No outpatient prescriptions have been marked as taking for the 11/07/16 encounter (Appointment) with Kataleia Quaranta, Cristal Deer, MD.    Allergies: Bee venom; Hornet venom; Peanuts [peanut oil]; Phenytoin; Valproic acid; Influenza vaccines; and Other  Social History   Social History  . Marital status: Single    Spouse name: N/A  . Number of children: N/A  . Years of education: N/A   Occupational History  . Not on file.   Social History Main Topics  . Smoking status: Current Every Day Smoker    Packs/day: 0.50    Years: 20.00    Types: Cigarettes  .  Smokeless tobacco: Never Used     Comment: 6 a day.  . Alcohol use No  . Drug use: No  . Sexual activity: Not on file   Other Topics Concern  . Not on file   Social History Narrative  . No narrative on file    Family History  Problem Relation Age of Onset  . Hypertension Mother   . Emphysema Mother   . Diabetes Unknown        sibling  . Heart disease Unknown        sibling  . Hypertension Brother     Review of Systems: A 12-system review of systems was performed and was negative except as noted in the HPI.  --------------------------------------------------------------------------------------------------  Physical Exam: There were no vitals taken for this visit.  General:  *** HEENT: No conjunctival pallor or scleral icterus. Moist mucous membranes.  OP clear. Neck: Supple without lymphadenopathy, thyromegaly, JVD, or HJR. No carotid bruit. Lungs: Normal work of breathing. Clear to auscultation bilaterally without wheezes or crackles. Heart: Regular rate and rhythm without murmurs, rubs, or gallops. Non-displaced PMI. Abd: Bowel sounds present. Soft, NT/ND without hepatosplenomegaly Ext: No lower extremity edema. Radial, PT, and DP pulses are 2+ bilaterally. Skin: Warm and dry without rash.  EKG:  ***  Lab Results  Component Value Date   WBC 10.8 (H) 05/16/2015   HGB 13.3 05/16/2015   HCT 39.9 05/16/2015   MCV 87.3 05/16/2015   PLT 273 05/16/2015    Lab Results  Component Value Date   NA  140 07/31/2016   K 4.1 07/31/2016   CL 105 07/31/2016   CO2 22 07/31/2016   BUN 9 07/31/2016   CREATININE 0.72 (L) 07/31/2016   GLUCOSE 95 07/31/2016   ALT 17 05/04/2015    Lab Results  Component Value Date   CHOL 158 05/04/2015   HDL 51 05/04/2015   LDLCALC 98 05/04/2015   TRIG 47 05/04/2015   CHOLHDL 3.1 05/04/2015    --------------------------------------------------------------------------------------------------  ASSESSMENT AND PLAN: Cristal Deer  Avory Mimbs, MD 11/07/2016 1:06 PM

## 2016-12-05 ENCOUNTER — Encounter: Payer: Self-pay | Admitting: Internal Medicine

## 2016-12-05 ENCOUNTER — Ambulatory Visit (INDEPENDENT_AMBULATORY_CARE_PROVIDER_SITE_OTHER): Payer: Medicaid Other | Admitting: Internal Medicine

## 2016-12-05 VITALS — BP 148/88 | HR 65 | Ht 70.0 in | Wt 251.5 lb

## 2016-12-05 DIAGNOSIS — R0609 Other forms of dyspnea: Secondary | ICD-10-CM

## 2016-12-05 DIAGNOSIS — Z720 Tobacco use: Secondary | ICD-10-CM

## 2016-12-05 DIAGNOSIS — I495 Sick sinus syndrome: Secondary | ICD-10-CM

## 2016-12-05 DIAGNOSIS — R600 Localized edema: Secondary | ICD-10-CM

## 2016-12-05 DIAGNOSIS — R0789 Other chest pain: Secondary | ICD-10-CM | POA: Diagnosis not present

## 2016-12-05 DIAGNOSIS — I10 Essential (primary) hypertension: Secondary | ICD-10-CM

## 2016-12-05 DIAGNOSIS — I2510 Atherosclerotic heart disease of native coronary artery without angina pectoris: Secondary | ICD-10-CM

## 2016-12-05 MED ORDER — FUROSEMIDE 40 MG PO TABS: ORAL_TABLET | ORAL | 3 refills | Status: DC

## 2016-12-05 NOTE — Patient Instructions (Signed)
Medication Instructions:  Your physician has recommended you make the following change in your medication:  INCREASE lasix to 80mg  (2 tablets) twice a day for 4 days then go back to your normal dose of 40mg  (1 tablet) twice a day. Continue to weigh yourself daily at the same time each day. If you gain more than 2 lbs overnight or 5 lbs in a week, take an extra 40mg  (1 tablet) that morning only.    Labwork: BMET  Testing/Procedures: none  Follow-Up: Your physician recommends that you schedule a follow-up appointment in: 3 months with Dr. Okey DupreEnd.    Any Other Special Instructions Will Be Listed Below (If Applicable).     If you need a refill on your cardiac medications before your next appointment, please call your pharmacy.

## 2016-12-05 NOTE — Progress Notes (Signed)
Follow-up Outpatient Visit Date: 12/05/2016  Primary Care Provider: Lottie Rater, MD [address] [place] [address]  Chief Complaint: Right wrist pain  HPI:  Mr. Shrout is a 51 y.o. year-old male with history of of sick sinus syndrome status post dual-chamber pacemaker, hypertension, hyperlipidemia, sleep apnea, and depression, who presents for follow-up of chest pain, shortness of breath, and leg swelling. I last saw Mr. Daman in June, at which time he continued to note leg swelling despite trying to limit his salt intake. He was not wearing compression stockings on a regular basis. He was also only using CPAP 2-3 nights a week. We agreed to increase furosemide to 80 mg twice a day for one week to see if that improved his symptoms. I also encouraged him to stop smoking and to use CPAP on a nightly basis. Unfortunately, he continues to smoke but is down to 1/3 pack per day.  Today, Mr. Kumpf reports that he has been doing fairly well. His biggest complaint is of pain involving the right wrist. This has been present for over a year but seems to have worsened over the last 3 months. He now has difficulty making a fist secondary to pain. He states that he was previously told this was due to rheumatoid arthritis, though he questions that diagnosis.  Mr. Chiasson notes that his swelling has improved with use of compression stockings as well as the increased dose of furosemide that he took for a week after her last visit. His weight has been stable. He has been using CPAP on a nightly basis. He continues to have some daytime fatigue, albeit better than at our last visit. He has rare epigastric and chest discomfort that improves with passing gas. Exertional dyspnea is unchanged. He has not had any significant palpitations or lightheadedness. He reports being compliant with his medications. Mr. Housman denies any issues with his pacemaker other than it still being "close to the  surface."  --------------------------------------------------------------------------------------------------  Cardiovascular History & Procedures: Cardiovascular Problems:  Atypical chest pain  Sick sinus syndrome status post dual-chamber pacemaker  Chronic leg edema  Risk Factors:  Nonobstructive coronary artery disease, hypertension, hyperlipidemia, obesity, male gender, and sedentary lifestyle  Cath/PCI:  LHC (05/04/15): Minor luminal irregularities without evidence of obstructive coronary artery disease. Normal LV function. Mildly elevated left ventricular Dearra Myhand diastolic  CV Surgery:  None  EP Procedures and Devices:  Dual-chamber pacemaker (05/04/15, Dr. Ladona Ridgel): [facility] [employer]  Non-Invasive Evaluation(s):  Exercise myocardial perfusion stress test (04/28/15): Intermediate risk study with moderate in size, moderate in severity, mid inferoseptal, mid inferior, and mid anterolateral segments consistent with ischemia in the RCA and diagonal distributions. LVEF 55-65%. Hypertensive response to exercise.  TTE (04/13/15): Normal LV size with mild focal basal hypertrophy of the septum. LVEF 60-65% with normal wall motion. Normal RV size and function. No significant valvular abnormalities.  Recent CV Pertinent Labs: Lab Results  Component Value Date   CHOL 158 05/04/2015   HDL 51 05/04/2015   LDLCALC 98 05/04/2015   TRIG 47 05/04/2015   CHOLHDL 3.1 05/04/2015   INR 1.06 05/03/2015   K 4.1 07/31/2016   BUN 9 07/31/2016   CREATININE 0.72 (L) 07/31/2016   CREATININE 0.68 (L) 05/16/2015    Past medical and surgical history were reviewed and updated in EPIC.  Current Meds  Medication Sig  . albuterol (PROVENTIL HFA;VENTOLIN HFA) 108 (90 Base) MCG/ACT inhaler Inhale 2 puffs into the lungs every 4 (four) hours as needed  for wheezing or shortness of breath.  Marland Kitchen. aspirin EC 81 MG EC tablet Take 1 tablet (81 mg total) by mouth daily.  Marland Kitchen. atorvastatin (LIPITOR) 40 MG tablet  Take 40 mg by mouth daily.   . carvedilol (COREG) 3.125 MG tablet Take 1 tablet (3.125 mg total) by mouth 2 (two) times daily.  . cholecalciferol (VITAMIN D) 1000 units tablet Take 1,000 Units by mouth daily.  Marland Kitchen. FLUoxetine (PROZAC) 40 MG capsule Take 40 mg by mouth daily.  . Fluticasone-Salmeterol (ADVAIR DISKUS) 250-50 MCG/DOSE AEPB Inhale 1 puff into the lungs 2 (two) times daily.  . furosemide (LASIX) 40 MG tablet Take 80 mg (2 tablets) twice a day for 1 week, then take 40 mg (1 tablet) twice a day.  . ibuprofen (ADVIL,MOTRIN) 800 MG tablet Take 800 mg by mouth every 8 (eight) hours as needed (pain).  Marland Kitchen. lamoTRIgine (LAMICTAL) 25 MG CHEW chewable tablet Chew 50 mg by mouth at bedtime.   . nitroGLYCERIN (NITROSTAT) 0.4 MG SL tablet Place 1 tablet (0.4 mg total) under the tongue every 5 (five) minutes as needed for chest pain.  Marland Kitchen. omeprazole (PRILOSEC) 20 MG capsule Take 40 mg by mouth daily as needed (for heartburn). Reported on 05/03/2015  . PHENObarbital (LUMINAL) 100 MG tablet Take 200 mg by mouth at bedtime.   . potassium chloride SA (K-DUR,KLOR-CON) 20 MEQ tablet Take 1 tablet (20 mEq) twice a day for 1 week while taking increased dose of furosemide.    Allergies: Bee venom; Hornet venom; Peanuts [peanut oil]; Phenytoin; Valproic acid; Influenza vaccines; and Other  Social History   Social History  . Marital status: Single    Spouse name: N/A  . Number of children: N/A  . Years of education: N/A   Occupational History  . Not on file.   Social History Main Topics  . Smoking status: Current Every Day Smoker    Packs/day: 0.50    Years: 20.00    Types: Cigarettes  . Smokeless tobacco: Never Used     Comment: 6 a day.  . Alcohol use No  . Drug use: No  . Sexual activity: Not on file   Other Topics Concern  . Not on file   Social History Narrative  . No narrative on file    Family History  Problem Relation Age of Onset  . Hypertension Mother   . Emphysema Mother   .  Diabetes Unknown        sibling  . Heart disease Unknown        sibling  . Hypertension Brother     Review of Systems: A 12-system review of systems was performed and was negative except as noted in the HPI.  --------------------------------------------------------------------------------------------------  Physical Exam: BP (!) 148/88 (BP Location: Left Arm, Patient Position: Sitting, Cuff Size: Normal)   Pulse 65   Ht 5\' 10"  (1.778 m)   Wt 251 lb 8 oz (114.1 kg)   BMI 36.09 kg/m   General:  Obese man, seated comfortably in the exam room. HEENT: No conjunctival pallor or scleral icterus. Moist mucous membranes.  OP clear. Neck: Supple without lymphadenopathy, thyromegaly, JVD, or HJR. No carotid bruit. Lungs: Normal work of breathing. Clear to auscultation bilaterally without wheezes or crackles. Heart: Regular rate and rhythm without murmurs, rubs, or gallops. Unable to assess PMI due to body habitus. Abd: Bowel sounds present. Soft, NT/ND . Unable to assess HSM due to body habitus. Ext: 1+ pretibial edema bilaterally. Radial, PT, and DP pulses are  2+ bilaterally. Mild tenderness overlying the volar surface of the right wrist. No deformity noted. Skin: Warm and dry without rash.  EKG:  Normal sinus rhythm with sinus arrhythmia and early R-wave transition.  Lab Results  Component Value Date   WBC 10.8 (H) 05/16/2015   HGB 13.3 05/16/2015   HCT 39.9 05/16/2015   MCV 87.3 05/16/2015   PLT 273 05/16/2015    Lab Results  Component Value Date   NA 140 07/31/2016   K 4.1 07/31/2016   CL 105 07/31/2016   CO2 22 07/31/2016   BUN 9 07/31/2016   CREATININE 0.72 (L) 07/31/2016   GLUCOSE 95 07/31/2016   ALT 17 05/04/2015    Lab Results  Component Value Date   CHOL 158 05/04/2015   HDL 51 05/04/2015   LDLCALC 98 05/04/2015   TRIG 47 05/04/2015   CHOLHDL 3.1 05/04/2015    --------------------------------------------------------------------------------------------------  ASSESSMENT AND PLAN: Atypical chest pain Symptoms are stable to improved and not consistent with angina. No further workup or intervention at this time.  Dyspnea on exertion and edema These findings are likely multifactorial, including obesity, sleep apnea, and a component of diastolic dysfunction. I will have Mr. Guthmiller increase his furosemide to 80 mg twice a day for 4 days and then go back to 40 mg twice a day. He should weigh himself daily and take 80 mg in the morning if he gains more than 2 pounds in a day or 5 pounds in a week. I will check a basic metabolic panel today to ensure normal renal function and electrolytes. I have encouraged him to continue to use CPAP on a nightly basis and to minimize his salt intake.  Sinus node dysfunction status post dual-chamber pacemaker Continue follow-up with the device clinic.  Hypertension Blood pressure mildly elevated today. We discussed the importance of sodium restriction. I have also encouraged Mr. Goetting increase his activity and to lose weight. We will not add any new medications today.  Tobacco abuse Mr. Rickey was encouraged to stop smoking.  Right wrist pain I encouraged Mr. Willner follow-up with his PCP.  Follow-up: Return to clinic in 3 months.  Yvonne Kendall, MD 12/05/2016 2:30 PM

## 2016-12-06 LAB — BASIC METABOLIC PANEL
BUN / CREAT RATIO: 11 (ref 9–20)
BUN: 9 mg/dL (ref 6–24)
CHLORIDE: 108 mmol/L — AB (ref 96–106)
CO2: 18 mmol/L — ABNORMAL LOW (ref 20–29)
Calcium: 9.4 mg/dL (ref 8.7–10.2)
Creatinine, Ser: 0.81 mg/dL (ref 0.76–1.27)
GFR calc non Af Amer: 101 mL/min/{1.73_m2} (ref >59)
GFR, EST AFRICAN AMERICAN: 117 mL/min/{1.73_m2} (ref >59)
Glucose: 96 mg/dL (ref 65–99)
Potassium: 4.4 mmol/L (ref 3.5–5.2)
Sodium: 141 mmol/L (ref 134–144)

## 2017-02-08 ENCOUNTER — Telehealth: Payer: Self-pay | Admitting: Internal Medicine

## 2017-02-08 NOTE — Telephone Encounter (Signed)
LMOV to r.s appt Dr. Okey DupreEnd not in Clinic

## 2017-03-12 ENCOUNTER — Ambulatory Visit: Payer: Medicaid Other | Admitting: Internal Medicine

## 2017-03-20 ENCOUNTER — Ambulatory Visit (INDEPENDENT_AMBULATORY_CARE_PROVIDER_SITE_OTHER): Payer: Medicaid Other | Admitting: Internal Medicine

## 2017-03-20 ENCOUNTER — Encounter: Payer: Self-pay | Admitting: Internal Medicine

## 2017-03-20 ENCOUNTER — Telehealth: Payer: Self-pay | Admitting: Internal Medicine

## 2017-03-20 VITALS — BP 130/80 | HR 69 | Ht 70.0 in | Wt 260.5 lb

## 2017-03-20 DIAGNOSIS — I495 Sick sinus syndrome: Secondary | ICD-10-CM | POA: Diagnosis not present

## 2017-03-20 DIAGNOSIS — R600 Localized edema: Secondary | ICD-10-CM

## 2017-03-20 DIAGNOSIS — R001 Bradycardia, unspecified: Secondary | ICD-10-CM | POA: Diagnosis not present

## 2017-03-20 DIAGNOSIS — I10 Essential (primary) hypertension: Secondary | ICD-10-CM

## 2017-03-20 DIAGNOSIS — Z950 Presence of cardiac pacemaker: Secondary | ICD-10-CM | POA: Diagnosis not present

## 2017-03-20 DIAGNOSIS — I5032 Chronic diastolic (congestive) heart failure: Secondary | ICD-10-CM | POA: Diagnosis not present

## 2017-03-20 DIAGNOSIS — I5033 Acute on chronic diastolic (congestive) heart failure: Secondary | ICD-10-CM | POA: Insufficient documentation

## 2017-03-20 MED ORDER — FUROSEMIDE 20 MG PO TABS: 60.0000 mg | ORAL_TABLET | Freq: Two times a day (BID) | ORAL | 3 refills | Status: DC

## 2017-03-20 NOTE — Progress Notes (Signed)
Follow-up Outpatient Visit Date: 03/20/2017  Primary Care Provider: Lottie Rater, MD [address] [place] [address]  Chief Complaint: Leg swelling  HPI:  Seth Tucker is a 52 y.o. year-old male with history of sick sinus syndrome status post dual-chamber pacemaker, hypertension, hyperlipidemia, sleep apnea, and depression, who presents for follow-up of edema and fatigue. I last saw him in 11/2016, at which time fatigue and leg swelling were improving with increased dose of furosemide, compression stockings, and regular use of CPAP. Dyspnea on exertion was stable.  Today, Seth Tucker reports increased leg swelling over the last 2 weeks. He has remained compliant with furosemide 40 mg BID and a low sodium diet, but feels like he is not urinating as much as he used to. He has been wearing CPAP nightly. Dyspnea on exertion has been stable. He has gained ~10 pounds over the last 3 months. He denies chest pain, palpitations, and lightheadedness. He notes some skin sensitivity overlying his pacemaker but otherwise has not had any issues with the device. He was a no-show for his follow-up in the EP clinic in 08/2016 and has not had a remote device interrogation since 03/2016.  --------------------------------------------------------------------------------------------------  Cardiovascular History & Procedures: Cardiovascular Problems:  Atypical chest pain  Sick sinus syndrome status post dual-chamber pacemaker  Chronic leg edema  Risk Factors:  Nonobstructive coronary artery disease, hypertension, hyperlipidemia, obesity, male gender, and sedentary lifestyle  Cath/PCI:  LHC (05/04/15): Minor luminal irregularities without evidence of obstructive coronary artery disease. Normal LV function. Mildly elevated left ventricular Kyera Felan diastolic pressure.  CV Surgery:  None  EP Procedures and Devices:  Dual-chamber pacemaker (05/04/15, Dr. Ladona Ridgel): [facility]  Scientific  Non-Invasive Evaluation(s):  Exercise myocardial perfusion stress test (04/28/15): Intermediate risk study with moderate in size, moderate in severity, mid inferoseptal, mid inferior, and mid anterolateral segments consistent with ischemia in the RCA and diagonal distributions. LVEF 55-65%. Hypertensive response to exercise.  TTE (04/13/15): Normal LV size with mild focal basal hypertrophy of the septum. LVEF 60-65% with normal wall motion. Normal RV size and function. No significant valvular abnormalities.  Recent CV Pertinent Labs: Lab Results  Component Value Date   CHOL 158 05/04/2015   HDL 51 05/04/2015   LDLCALC 98 05/04/2015   TRIG 47 05/04/2015   CHOLHDL 3.1 05/04/2015   INR 1.06 05/03/2015   K 4.4 12/05/2016   BUN 9 12/05/2016   CREATININE 0.81 12/05/2016   CREATININE 0.68 (L) 05/16/2015    Past medical and surgical history were reviewed and updated in EPIC.  Current Meds  Medication Sig  . albuterol (PROVENTIL HFA;VENTOLIN HFA) 108 (90 Base) MCG/ACT inhaler Inhale 2 puffs into the lungs every 4 (four) hours as needed for wheezing or shortness of breath.  Marland Kitchen aspirin EC 81 MG EC tablet Take 1 tablet (81 mg total) by mouth daily.  Marland Kitchen atorvastatin (LIPITOR) 40 MG tablet Take 40 mg by mouth daily.   . carvedilol (COREG) 3.125 MG tablet Take 1 tablet (3.125 mg total) by mouth 2 (two) times daily.  . cholecalciferol (VITAMIN D) 1000 units tablet Take 1,000 Units by mouth daily.  Marland Kitchen FLUoxetine (PROZAC) 40 MG capsule Take 40 mg by mouth daily.  . Fluticasone-Salmeterol (ADVAIR DISKUS) 250-50 MCG/DOSE AEPB Inhale 1 puff into the lungs 2 (two) times daily.  . furosemide (LASIX) 40 MG tablet Take 40 mg (1 tablet) twice a day.  . ibuprofen (ADVIL,MOTRIN) 800 MG tablet Take 800 mg by mouth every 8 (eight) hours as needed (  pain).  . lamoTRIgine (LAMICTAL) 25 MG CHEW chewable tablet Chew 50 mg by mouth at bedtime.   . nitroGLYCERIN (NITROSTAT) 0.4 MG SL tablet Place 1 tablet  (0.4 mg total) under the tongue every 5 (five) minutes as needed for chest pain.  Marland Kitchen omeprazole (PRILOSEC) 20 MG capsule Take 40 mg by mouth daily as needed (for heartburn). Reported on 05/03/2015  . PHENObarbital (LUMINAL) 100 MG tablet Take 200 mg by mouth at bedtime.   . potassium chloride SA (K-DUR,KLOR-CON) 20 MEQ tablet Take 1 tablet (20 mEq) twice a day for 1 week while taking increased dose of furosemide.    Allergies: Bee venom; Hornet venom; Peanuts [peanut oil]; Phenytoin; Valproic acid; Influenza vaccines; and Other  Social History   Socioeconomic History  . Marital status: Single    Spouse name: Not on file  . Number of children: Not on file  . Years of education: Not on file  . Highest education level: Not on file  Social Needs  . Financial resource strain: Not on file  . Food insecurity - worry: Not on file  . Food insecurity - inability: Not on file  . Transportation needs - medical: Not on file  . Transportation needs - non-medical: Not on file  Occupational History  . Not on file  Tobacco Use  . Smoking status: Current Every Day Smoker    Packs/day: 0.50    Years: 20.00    Pack years: 10.00    Types: Cigarettes  . Smokeless tobacco: Never Used  . Tobacco comment: 6 a day.  Substance and Sexual Activity  . Alcohol use: No  . Drug use: No  . Sexual activity: Not on file  Other Topics Concern  . Not on file  Social History Narrative  . Not on file    Family History  Problem Relation Age of Onset  . Hypertension Mother   . Emphysema Mother   . Diabetes Unknown        sibling  . Heart disease Unknown        sibling  . Hypertension Brother     Review of Systems: A 12-system review of systems was performed and was negative except as noted in the HPI.  --------------------------------------------------------------------------------------------------  Physical Exam: BP 130/80 (BP Location: Left Arm, Patient Position: Sitting, Cuff Size: Large)    Pulse 69   Ht 5\' 10"  (1.778 m)   Wt 260 lb 8 oz (118.2 kg)   BMI 37.38 kg/m   General:  Obese man, seated comfortably in the exam room. HEENT: No conjunctival pallor or scleral icterus. Moist mucous membranes.  OP clear. Neck: Supple without lymphadenopathy, thyromegaly, JVD, or HJR. No carotid bruit. Lungs: Normal work of breathing. Clear to auscultation bilaterally without wheezes or crackles. Heart: Distant heart sounds. Regular rate and rhythm without murmurs, rubs, or gallops. Unable to assess PMI due to body habitus. Abd: Bowel sounds present. Soft, NT/ND. Unable to assess HSM due to body habitus. Ext: 2+ pretibial edema to the knees. Skin: Warm and dry without rash.  EKG:  NSR without abnormalities.  Lab Results  Component Value Date   WBC 10.8 (H) 05/16/2015   HGB 13.3 05/16/2015   HCT 39.9 05/16/2015   MCV 87.3 05/16/2015   PLT 273 05/16/2015    Lab Results  Component Value Date   NA 141 12/05/2016   K 4.4 12/05/2016   CL 108 (H) 12/05/2016   CO2 18 (L) 12/05/2016   BUN 9 12/05/2016   CREATININE 0.81  12/05/2016   GLUCOSE 96 12/05/2016   ALT 17 05/04/2015    Lab Results  Component Value Date   CHOL 158 05/04/2015   HDL 51 05/04/2015   LDLCALC 98 05/04/2015   TRIG 47 05/04/2015   CHOLHDL 3.1 05/04/2015    --------------------------------------------------------------------------------------------------  ASSESSMENT AND PLAN: Leg edema This has been a chronic problem and is likely multifactorial, including a combination of venous insufficiency and diastolic/right heart failure. Seth Tucker has 2+ edema in both calves and has also gained ~10 pounds since his last visit in 11/2016. We have agreed to increase furosemide to 60 mg BID and check a BMP today and again in ~2 weeks. I have reiterated the importance of sodium restriction. I will also provide Seth Tucker with a prescription for compression stockings. He should continue to use CPAP on a regular  basis.  Chronic diastolic heart failure Increased edema and weight, as above, with stable DOE. We will increase furosemide, as above.  Sinus node dysfunction status post pacemaker EKG today demonstrates normal sinus rhythm. Seth Tucker has been lost to follow-up in our device clinic; we will get him reestablished today to ensure appropriate follow-up of his pacemaker.  Hypertension Blood pressure borderline elevated today. As above, we will increase furosemide to 60 mg BID. No other medication changes at this time.  Follow-up: Return to clinic in 3 months, sooner if edema does not improve.  Yvonne Kendallhristopher Michol Emory, MD 03/20/2017 11:47 AM

## 2017-03-20 NOTE — Telephone Encounter (Signed)
Error

## 2017-03-20 NOTE — Patient Instructions (Addendum)
Medication Instructions:  Your physician has recommended you make the following change in your medication:  1. INCREASE Furosemide to 60 mg twice daily   Labwork: Your physician recommends that you return for lab work in: 2 weeks for repeat labs. [facility] to [facility] and check in at the front desk to have done.   Follow-Up: Your physician recommends that you schedule a follow-up appointment in: 3 months with Dr. Okey DupreEnd.  PLEASE schedule Device Clinic appointment.   WEAR compression hose.   It was a pleasure seeing you today here in the office. Please do not hesitate to give us a call back if you have any further questions. [phone]  Granger CellarPamela A. RN, BSN

## 2017-03-21 ENCOUNTER — Telehealth: Payer: Self-pay | Admitting: Internal Medicine

## 2017-03-21 ENCOUNTER — Telehealth: Payer: Self-pay | Admitting: *Deleted

## 2017-03-21 ENCOUNTER — Other Ambulatory Visit: Payer: Self-pay | Admitting: *Deleted

## 2017-03-21 LAB — BASIC METABOLIC PANEL
BUN/Creatinine Ratio: 16 (ref 9–20)
BUN: 13 mg/dL (ref 6–24)
CALCIUM: 9.6 mg/dL (ref 8.7–10.2)
CHLORIDE: 108 mmol/L — AB (ref 96–106)
CO2: 18 mmol/L — ABNORMAL LOW (ref 20–29)
Creatinine, Ser: 0.81 mg/dL (ref 0.76–1.27)
GFR calc Af Amer: 117 mL/min/{1.73_m2} (ref >59)
GFR calc non Af Amer: 101 mL/min/{1.73_m2} (ref >59)
GLUCOSE: 98 mg/dL (ref 65–99)
POTASSIUM: 5 mmol/L (ref 3.5–5.2)
Sodium: 145 mmol/L — ABNORMAL HIGH (ref 134–144)

## 2017-03-21 MED ORDER — FUROSEMIDE 40 MG PO TABS: 60.0000 mg | ORAL_TABLET | Freq: Two times a day (BID) | ORAL | 3 refills | Status: DC

## 2017-03-21 MED ORDER — POTASSIUM CHLORIDE CRYS ER 20 MEQ PO TBCR: EXTENDED_RELEASE_TABLET | ORAL | 3 refills | Status: DC

## 2017-03-21 NOTE — Telephone Encounter (Signed)
Received request from patient's pharmacy for "new Rx for 40 mg, the 20 mg is 60 dollars for a 90 day supply and pt doesn't have insurance."  New Rx sent in for furosemide 40 mg tablets, Take 1.5 tablets (60 mg) by mouth two times a day.

## 2017-03-21 NOTE — Telephone Encounter (Signed)
[facility] to [facility] furosemide prescription States medication is too expensive Would like to know if there is a different medication he can be prescribed Please call

## 2017-03-21 NOTE — Telephone Encounter (Signed)
Spoke with patient and he states that when he went to pick up medication that it was going to cost over $40 and he can't pay that. Reviewed that I would call to find out what the problem may be. Spoke with pharmacy and then called patient back. Pharmacy processed with insurance and it will be $3.00 for his prescription. He verbalized understanding with no further questions at this time.

## 2017-03-26 ENCOUNTER — Encounter: Payer: Medicaid Other | Admitting: Internal Medicine
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
## 2017-05-07 ENCOUNTER — Encounter: Payer: Self-pay | Admitting: Internal Medicine

## 2017-05-07 ENCOUNTER — Ambulatory Visit (INDEPENDENT_AMBULATORY_CARE_PROVIDER_SITE_OTHER): Payer: Medicaid Other | Admitting: Internal Medicine

## 2017-05-07 VITALS — BP 116/80 | HR 63 | Ht 70.0 in | Wt 257.8 lb

## 2017-05-07 DIAGNOSIS — Z950 Presence of cardiac pacemaker: Secondary | ICD-10-CM | POA: Diagnosis not present

## 2017-05-07 DIAGNOSIS — I5032 Chronic diastolic (congestive) heart failure: Secondary | ICD-10-CM | POA: Diagnosis not present

## 2017-05-07 DIAGNOSIS — I495 Sick sinus syndrome: Secondary | ICD-10-CM | POA: Diagnosis not present
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
## 2017-05-07 NOTE — Patient Instructions (Addendum)
Medication Instructions: - Your physician recommends that you continue on your current medications as directed. Please refer to the Current Medication list given to you today.  Labwork: - none ordered  Procedures/Testing: - none ordered  Follow-Up: - Remote monitoring is used to monitor your Pacemaker of ICD from home. This monitoring reduces the number of office visits required to check your device to one time per year. It allows us to keep an eye on the functioning of your device to ensure it is working properly. You are scheduled for a device check from home on 08/06/17. You may send your transmission at any time that day. If you have a wireless device, the transmission will be sent automatically. After your physician reviews your transmission, you will receive a postcard with your next transmission date.   - Your physician wants you to follow-up in: 1 year with Dr. Graciela HusbandsKlein. You will receive a reminder letter in the mail two months in advance. If you don't receive a letter, please call our office to schedule the follow-up appointment.  It was a pleasure seeing you today here in the office. Please do not hesitate to give us a call back if you have any further questions. [phone]  Carnegie CellarPamela A. RN, BSN

## 2017-06-12 ENCOUNTER — Ambulatory Visit: Payer: Medicaid Other | Admitting: Internal Medicine

## 2017-06-12 NOTE — Progress Notes (Deleted)
Follow-up Outpatient Visit Date: 06/12/2017  Primary Care Provider: Lottie Rater, MD [address] [place] [address]  Chief Complaint: ***  HPI:  Seth Tucker is a 52 y.o. year-old male with history of sick sinus syndrome status post pacemaker, HTN, HLD, OSA, and depression, who presents for follow-up of edema and fatigue.  I last saw him in January, at which time he reported increased leg swelling over the preceding 2 weeks.  He had been compliant with furosemide 40 mg twice daily and a low-sodium diet but felt like his urine output had decreased.  He had also gained 3 pounds over the preceding 3 months..  --------------------------------------------------------------------------------------------------  Cardiovascular History & Procedures: Cardiovascular Problems:  Atypical chest pain  Sick sinus syndrome status post dual-chamber pacemaker  Chronic leg edema  Risk Factors:  Nonobstructive coronary artery disease, hypertension, hyperlipidemia, obesity, male gender, and sedentary lifestyle  Cath/PCI:  LHC (05/04/15): Minor luminal irregularities without evidence of obstructive coronary artery disease. Normal LV function. Mildly elevated left ventricular Makaylie Dedeaux diastolic pressure.  CV Surgery:  None  EP Procedures and Devices:  Dual-chamber pacemaker (05/04/15, Dr. Ladona Ridgel): [facility]  Non-Invasive Evaluation(s):  Exercise myocardial perfusion stress test (04/28/15): Intermediate risk study with moderate in size, moderate in severity, mid inferoseptal, mid inferior, and mid anterolateral segments consistent with ischemia in the RCA and diagonal distributions. LVEF 55-65%. Hypertensive response to exercise.  TTE (04/13/15): Normal LV size with mild focal basal hypertrophy of the septum. LVEF 60-65% with normal wall motion. Normal RV size and function. No significant valvular abnormalities.  Recent CV Pertinent Labs: Lab Results  Component Value Date     CHOL 158 05/04/2015   HDL 51 05/04/2015   LDLCALC 98 05/04/2015   TRIG 47 05/04/2015   CHOLHDL 3.1 05/04/2015   INR 1.06 05/03/2015   K 5.0 03/20/2017   BUN 13 03/20/2017   CREATININE 0.81 03/20/2017   CREATININE 0.68 (L) 05/16/2015    Past medical and surgical history were reviewed and updated in EPIC.  No outpatient medications have been marked as taking for the 06/12/17 encounter (Appointment) with Trinitey Roache, Cristal Deer, MD.    Allergies: Bee venom; Hornet venom; Peanuts [peanut oil]; Phenytoin; Valproic acid; Influenza vaccines; and Other  Social History   Tobacco Use  . Smoking status: Current Every Day Smoker    Packs/day: 0.50    Years: 20.00    Pack years: 10.00    Types: Cigarettes  . Smokeless tobacco: Never Used  . Tobacco comment: 6 a day.  Substance Use Topics  . Alcohol use: No  . Drug use: No    Family History  Problem Relation Age of Onset  . Hypertension Mother   . Emphysema Mother   . Diabetes Unknown        sibling  . Heart disease Unknown        sibling  . Hypertension Brother     Review of Systems: A 12-system review of systems was performed and was negative except as noted in the HPI.  --------------------------------------------------------------------------------------------------  Physical Exam: There were no vitals taken for this visit.  General:  *** HEENT: No conjunctival pallor or scleral icterus. Moist mucous membranes.  OP clear. Neck: Supple without lymphadenopathy, thyromegaly, JVD, or HJR. No carotid bruit. Lungs: Normal work of breathing. Clear to auscultation bilaterally without wheezes or crackles. Heart: Regular rate and rhythm without murmurs, rubs, or gallops. Non-displaced PMI. Abd: Bowel sounds present. Soft, NT/ND without hepatosplenomegaly Ext: No lower extremity edema. Radial,  PT, and DP pulses are 2+ bilaterally. Skin: Warm and dry without rash.  EKG:  ***  Lab Results  Component Value Date   WBC 10.8 (H)  05/16/2015   HGB 13.3 05/16/2015   HCT 39.9 05/16/2015   MCV 87.3 05/16/2015   PLT 273 05/16/2015    Lab Results  Component Value Date   NA 145 (H) 03/20/2017   K 5.0 03/20/2017   CL 108 (H) 03/20/2017   CO2 18 (L) 03/20/2017   BUN 13 03/20/2017   CREATININE 0.81 03/20/2017   GLUCOSE 98 03/20/2017   ALT 17 05/04/2015    Lab Results  Component Value Date   CHOL 158 05/04/2015   HDL 51 05/04/2015   LDLCALC 98 05/04/2015   TRIG 47 05/04/2015   CHOLHDL 3.1 05/04/2015    --------------------------------------------------------------------------------------------------  ASSESSMENT AND PLAN: Cristal Deer***  Seth Neidhardt, MD 06/12/2017 2:50 PM

## 2017-06-13 ENCOUNTER — Encounter: Payer: Self-pay | Admitting: Internal Medicine

## 2017-08-06 ENCOUNTER — Telehealth: Payer: Self-pay | Admitting: Cardiology

## 2017-08-06 ENCOUNTER — Encounter: Payer: Medicaid Other | Admitting: *Deleted

## 2017-08-06 NOTE — Telephone Encounter (Signed)
LMOVM reminding pt to send remote transmission.   

## 2017-08-07 ENCOUNTER — Encounter: Payer: Self-pay | Admitting: Cardiology

## 2017-09-13 ENCOUNTER — Encounter: Payer: Self-pay | Admitting: Cardiology

## 2017-12-11 ENCOUNTER — Encounter: Payer: Self-pay | Admitting: Cardiology

## 2018-03-02 ENCOUNTER — Emergency Department: Payer: Medicaid Other

## 2018-03-02 ENCOUNTER — Emergency Department
Admission: EM | Admit: 2018-03-02 | Discharge: 2018-03-02 | Disposition: A | Discharge status: Home / Self Care | Payer: Medicaid Other | Attending: Emergency Medicine | Admitting: Emergency Medicine

## 2018-03-02 ENCOUNTER — Encounter: Payer: Self-pay | Admitting: Emergency Medicine

## 2018-03-02 ENCOUNTER — Other Ambulatory Visit: Payer: Self-pay

## 2018-03-02 DIAGNOSIS — M1712 Unilateral primary osteoarthritis, left knee: Secondary | ICD-10-CM | POA: Diagnosis not present

## 2018-03-02 DIAGNOSIS — Z7982 Long term (current) use of aspirin: Secondary | ICD-10-CM | POA: Diagnosis not present

## 2018-03-02 DIAGNOSIS — I2510 Atherosclerotic heart disease of native coronary artery without angina pectoris: Secondary | ICD-10-CM | POA: Diagnosis not present

## 2018-03-02 DIAGNOSIS — Y999 Unspecified external cause status: Secondary | ICD-10-CM | POA: Diagnosis not present

## 2018-03-02 DIAGNOSIS — I110 Hypertensive heart disease with heart failure: Secondary | ICD-10-CM | POA: Insufficient documentation

## 2018-03-02 DIAGNOSIS — I5032 Chronic diastolic (congestive) heart failure: Secondary | ICD-10-CM | POA: Diagnosis not present

## 2018-03-02 DIAGNOSIS — Z950 Presence of cardiac pacemaker: Secondary | ICD-10-CM | POA: Insufficient documentation

## 2018-03-02 DIAGNOSIS — W0110XA Fall on same level from slipping, tripping and stumbling with subsequent striking against unspecified object, initial encounter: Secondary | ICD-10-CM | POA: Insufficient documentation

## 2018-03-02 DIAGNOSIS — F17210 Nicotine dependence, cigarettes, uncomplicated: Secondary | ICD-10-CM | POA: Insufficient documentation

## 2018-03-02 DIAGNOSIS — S93602A Unspecified sprain of left foot, initial encounter: Secondary | ICD-10-CM | POA: Diagnosis not present

## 2018-03-02 DIAGNOSIS — Y929 Unspecified place or not applicable: Secondary | ICD-10-CM | POA: Diagnosis not present

## 2018-03-02 DIAGNOSIS — S8990XA Unspecified injury of unspecified lower leg, initial encounter: Secondary | ICD-10-CM | POA: Insufficient documentation

## 2018-03-02 DIAGNOSIS — Y9301 Activity, walking, marching and hiking: Secondary | ICD-10-CM | POA: Diagnosis not present

## 2018-03-02 DIAGNOSIS — S93402A Sprain of unspecified ligament of left ankle, initial encounter: Secondary | ICD-10-CM | POA: Diagnosis not present

## 2018-03-02 MED ORDER — OXYCODONE HCL 5 MG PO TABS
5.0000 mg | ORAL_TABLET | Freq: Once | ORAL | Status: AC
  Administered 2018-03-02: 5 mg via ORAL
  Filled 2018-03-02: qty 1

## 2018-03-02 MED ORDER — HYDROCODONE-ACETAMINOPHEN 5-325 MG PO TABS: 1.0000 | ORAL_TABLET | Freq: Four times a day (QID) | ORAL | 0 refills | Status: AC | PRN

## 2018-03-02 NOTE — ED Notes (Signed)
See triage note  States slipped last pm  States landed on left foot  Having pain to left great toe ,ankle and knee
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
## 2018-03-02 NOTE — Discharge Instructions (Signed)
Follow up with orthopedics for symptoms that are not improving over the week.  Return to the ER for symptoms that change or worsen if unable to schedule an appointment with PCP or the specialist.

## 2018-03-02 NOTE — ED Triage Notes (Signed)
States fell last night, injuring left knee and top of foot.    Ambulatory on arrival.  NAD

## 2018-04-10 ENCOUNTER — Emergency Department
Admission: EM | Admit: 2018-04-10 | Discharge: 2018-04-10 | Disposition: A | Discharge status: Home / Self Care | Payer: Medicaid Other | Source: Home / Self Care | Attending: Student in an Organized Health Care Education/Training Program | Admitting: Student in an Organized Health Care Education/Training Program

## 2018-04-10 ENCOUNTER — Other Ambulatory Visit: Payer: Self-pay

## 2018-04-10 ENCOUNTER — Emergency Department: Payer: Medicaid Other

## 2018-04-10 ENCOUNTER — Inpatient Hospital Stay
Admission: EM | Admit: 2018-04-10 | Discharge: 2018-04-12 | DRG: 101 | Disposition: A | Discharge status: Home / Self Care | Payer: Medicaid Other | Attending: Internal Medicine | Admitting: Internal Medicine

## 2018-04-10 DIAGNOSIS — Z9981 Dependence on supplemental oxygen: Secondary | ICD-10-CM | POA: Diagnosis not present

## 2018-04-10 DIAGNOSIS — Z91010 Allergy to peanuts: Secondary | ICD-10-CM | POA: Insufficient documentation

## 2018-04-10 DIAGNOSIS — Y999 Unspecified external cause status: Secondary | ICD-10-CM

## 2018-04-10 DIAGNOSIS — Z833 Family history of diabetes mellitus: Secondary | ICD-10-CM

## 2018-04-10 DIAGNOSIS — Y929 Unspecified place or not applicable: Secondary | ICD-10-CM

## 2018-04-10 DIAGNOSIS — E559 Vitamin D deficiency, unspecified: Secondary | ICD-10-CM | POA: Diagnosis present

## 2018-04-10 DIAGNOSIS — Y939 Activity, unspecified: Secondary | ICD-10-CM

## 2018-04-10 DIAGNOSIS — Z8249 Family history of ischemic heart disease and other diseases of the circulatory system: Secondary | ICD-10-CM | POA: Diagnosis not present

## 2018-04-10 DIAGNOSIS — Z7982 Long term (current) use of aspirin: Secondary | ICD-10-CM | POA: Insufficient documentation

## 2018-04-10 DIAGNOSIS — G40909 Epilepsy, unspecified, not intractable, without status epilepticus: Principal | ICD-10-CM | POA: Diagnosis present

## 2018-04-10 DIAGNOSIS — S0990XA Unspecified injury of head, initial encounter: Secondary | ICD-10-CM

## 2018-04-10 DIAGNOSIS — S01511A Laceration without foreign body of lip, initial encounter: Secondary | ICD-10-CM | POA: Diagnosis present

## 2018-04-10 DIAGNOSIS — Z888 Allergy status to other drugs, medicaments and biological substances status: Secondary | ICD-10-CM

## 2018-04-10 DIAGNOSIS — I5032 Chronic diastolic (congestive) heart failure: Secondary | ICD-10-CM | POA: Insufficient documentation

## 2018-04-10 DIAGNOSIS — Z7951 Long term (current) use of inhaled steroids: Secondary | ICD-10-CM

## 2018-04-10 DIAGNOSIS — Z950 Presence of cardiac pacemaker: Secondary | ICD-10-CM

## 2018-04-10 DIAGNOSIS — E785 Hyperlipidemia, unspecified: Secondary | ICD-10-CM | POA: Diagnosis present

## 2018-04-10 DIAGNOSIS — Z825 Family history of asthma and other chronic lower respiratory diseases: Secondary | ICD-10-CM | POA: Diagnosis not present

## 2018-04-10 DIAGNOSIS — Z79899 Other long term (current) drug therapy: Secondary | ICD-10-CM

## 2018-04-10 DIAGNOSIS — W19XXXA Unspecified fall, initial encounter: Secondary | ICD-10-CM | POA: Diagnosis present

## 2018-04-10 DIAGNOSIS — W1789XA Other fall from one level to another, initial encounter: Secondary | ICD-10-CM

## 2018-04-10 DIAGNOSIS — G4730 Sleep apnea, unspecified: Secondary | ICD-10-CM | POA: Diagnosis present

## 2018-04-10 DIAGNOSIS — Z716 Tobacco abuse counseling: Secondary | ICD-10-CM

## 2018-04-10 DIAGNOSIS — S0081XA Abrasion of other part of head, initial encounter: Secondary | ICD-10-CM | POA: Insufficient documentation

## 2018-04-10 DIAGNOSIS — Z791 Long term (current) use of non-steroidal anti-inflammatories (NSAID): Secondary | ICD-10-CM | POA: Diagnosis not present

## 2018-04-10 DIAGNOSIS — F329 Major depressive disorder, single episode, unspecified: Secondary | ICD-10-CM | POA: Diagnosis present

## 2018-04-10 DIAGNOSIS — F17210 Nicotine dependence, cigarettes, uncomplicated: Secondary | ICD-10-CM | POA: Diagnosis present

## 2018-04-10 DIAGNOSIS — Y92481 Parking lot as the place of occurrence of the external cause: Secondary | ICD-10-CM

## 2018-04-10 DIAGNOSIS — I110 Hypertensive heart disease with heart failure: Secondary | ICD-10-CM | POA: Insufficient documentation

## 2018-04-10 DIAGNOSIS — Z887 Allergy status to serum and vaccine status: Secondary | ICD-10-CM | POA: Diagnosis not present

## 2018-04-10 DIAGNOSIS — R569 Unspecified convulsions: Secondary | ICD-10-CM

## 2018-04-10 DIAGNOSIS — Z23 Encounter for immunization: Secondary | ICD-10-CM

## 2018-04-10 DIAGNOSIS — Z91030 Bee allergy status: Secondary | ICD-10-CM | POA: Diagnosis not present

## 2018-04-10 DIAGNOSIS — I2510 Atherosclerotic heart disease of native coronary artery without angina pectoris: Secondary | ICD-10-CM | POA: Insufficient documentation

## 2018-04-10 LAB — CBC WITH DIFFERENTIAL/PLATELET
Abs Immature Granulocytes: 0.04 10*3/uL (ref 0.00–0.07)
Basophils Absolute: 0 10*3/uL (ref 0.0–0.1)
Basophils Relative: 0 %
EOS ABS: 0.1 10*3/uL (ref 0.0–0.5)
EOS PCT: 1 %
HCT: 45.4 % (ref 39.0–52.0)
Hemoglobin: 14.6 g/dL (ref 13.0–17.0)
Immature Granulocytes: 0 %
Lymphocytes Relative: 31 %
Lymphs Abs: 4.2 10*3/uL — ABNORMAL HIGH (ref 0.7–4.0)
MCH: 29.2 pg (ref 26.0–34.0)
MCHC: 32.2 g/dL (ref 30.0–36.0)
MCV: 90.8 fL (ref 80.0–100.0)
Monocytes Absolute: 1.1 10*3/uL — ABNORMAL HIGH (ref 0.1–1.0)
Monocytes Relative: 8 %
Neutro Abs: 8.2 10*3/uL — ABNORMAL HIGH (ref 1.7–7.7)
Neutrophils Relative %: 60 %
PLATELETS: 233 10*3/uL (ref 150–400)
RBC: 5 MIL/uL (ref 4.22–5.81)
RDW: 13.8 % (ref 11.5–15.5)
WBC: 13.7 10*3/uL — ABNORMAL HIGH (ref 4.0–10.5)
nRBC: 0 % (ref 0.0–0.2)

## 2018-04-10 LAB — BASIC METABOLIC PANEL
Anion gap: 11 (ref 5–15)
Anion gap: 18 — ABNORMAL HIGH (ref 5–15)
BUN: 11 mg/dL (ref 6–20)
BUN: 12 mg/dL (ref 6–20)
CALCIUM: 9.6 mg/dL (ref 8.9–10.3)
CO2: 23 mmol/L (ref 22–32)
CO2: 15 mmol/L — ABNORMAL LOW (ref 22–32)
Calcium: 9.3 mg/dL (ref 8.9–10.3)
Chloride: 106 mmol/L (ref 98–111)
Chloride: 108 mmol/L (ref 98–111)
Creatinine, Ser: 0.87 mg/dL (ref 0.61–1.24)
Creatinine, Ser: 0.96 mg/dL (ref 0.61–1.24)
GFR calc Af Amer: >60 mL/min (ref >60)
GFR calc Af Amer: >60 mL/min (ref >60)
GFR calc non Af Amer: >60 mL/min (ref >60)
GFR calc non Af Amer: >60 mL/min (ref >60)
Glucose, Bld: 131 mg/dL — ABNORMAL HIGH (ref 70–99)
Glucose, Bld: 133 mg/dL — ABNORMAL HIGH (ref 70–99)
Potassium: 3.8 mmol/L (ref 3.5–5.1)
Potassium: 3.7 mmol/L (ref 3.5–5.1)
Sodium: 140 mmol/L (ref 135–145)
Sodium: 141 mmol/L (ref 135–145)

## 2018-04-10 LAB — TROPONIN I

## 2018-04-10 MED ORDER — CARVEDILOL 3.125 MG PO TABS
3.1250 mg | ORAL_TABLET | Freq: Two times a day (BID) | ORAL | Status: DC
  Administered 2018-04-10 – 2018-04-12 (×4): 3.125 mg via ORAL
  Filled 2018-04-10 (×4): qty 1

## 2018-04-10 MED ORDER — ASPIRIN EC 81 MG PO TBEC
81.0000 mg | DELAYED_RELEASE_TABLET | Freq: Every day | ORAL | Status: DC
  Administered 2018-04-11 – 2018-04-12 (×2): 81 mg via ORAL
  Filled 2018-04-10 (×2): qty 1

## 2018-04-10 MED ORDER — HYDROCODONE-ACETAMINOPHEN 5-325 MG PO TABS
1.0000 | ORAL_TABLET | Freq: Once | ORAL | Status: AC
  Administered 2018-04-10: 1 via ORAL
  Filled 2018-04-10: qty 1

## 2018-04-10 MED ORDER — HYDROCODONE-ACETAMINOPHEN 5-325 MG PO TABS: 1.0000 | ORAL_TABLET | ORAL | Status: DC | PRN

## 2018-04-10 MED ORDER — LAMOTRIGINE 25 MG PO TABS
50.0000 mg | ORAL_TABLET | Freq: Every day | ORAL | Status: DC
  Administered 2018-04-10: 50 mg via ORAL
  Filled 2018-04-10: qty 2

## 2018-04-10 MED ORDER — SODIUM CHLORIDE 0.9 % IV SOLN: 250.0000 mL | INTRAVENOUS | Status: DC | PRN

## 2018-04-10 MED ORDER — ACETAMINOPHEN 325 MG PO TABS: 650.0000 mg | ORAL_TABLET | Freq: Four times a day (QID) | ORAL | Status: DC | PRN

## 2018-04-10 MED ORDER — FLUOXETINE HCL 20 MG PO CAPS
40.0000 mg | ORAL_CAPSULE | Freq: Every day | ORAL | Status: DC
  Administered 2018-04-11 – 2018-04-12 (×2): 40 mg via ORAL
  Filled 2018-04-10 (×2): qty 2

## 2018-04-10 MED ORDER — NITROGLYCERIN 0.4 MG SL SUBL: 0.4000 mg | SUBLINGUAL_TABLET | SUBLINGUAL | Status: DC | PRN

## 2018-04-10 MED ORDER — FUROSEMIDE 40 MG PO TABS
60.0000 mg | ORAL_TABLET | Freq: Two times a day (BID) | ORAL | Status: DC
  Administered 2018-04-11 – 2018-04-12 (×3): 60 mg via ORAL
  Filled 2018-04-10 (×3): qty 1

## 2018-04-10 MED ORDER — MOMETASONE FURO-FORMOTEROL FUM 200-5 MCG/ACT IN AERO
2.0000 | INHALATION_SPRAY | Freq: Two times a day (BID) | RESPIRATORY_TRACT | Status: DC
  Administered 2018-04-10 – 2018-04-12 (×4): 2 via RESPIRATORY_TRACT
  Filled 2018-04-10: qty 8.8

## 2018-04-10 MED ORDER — LEVETIRACETAM IN NACL 500 MG/100ML IV SOLN
500.0000 mg | Freq: Two times a day (BID) | INTRAVENOUS | Status: DC
  Administered 2018-04-11: 500 mg via INTRAVENOUS
  Filled 2018-04-10 (×2): qty 100

## 2018-04-10 MED ORDER — ACETAMINOPHEN 650 MG RE SUPP: 650.0000 mg | Freq: Four times a day (QID) | RECTAL | Status: DC | PRN

## 2018-04-10 MED ORDER — TETANUS-DIPHTH-ACELL PERTUSSIS 5-2.5-18.5 LF-MCG/0.5 IM SUSP
0.5000 mL | Freq: Once | INTRAMUSCULAR | Status: AC
  Administered 2018-04-10: 0.5 mL via INTRAMUSCULAR
  Filled 2018-04-10: qty 0.5

## 2018-04-10 MED ORDER — CLINDAMYCIN HCL 300 MG PO CAPS: 300.0000 mg | ORAL_CAPSULE | Freq: Three times a day (TID) | ORAL | 0 refills | Status: DC

## 2018-04-10 MED ORDER — ENOXAPARIN SODIUM 40 MG/0.4ML ~~LOC~~ SOLN
40.0000 mg | SUBCUTANEOUS | Status: DC
  Administered 2018-04-11: 40 mg via SUBCUTANEOUS
  Filled 2018-04-10: qty 0.4

## 2018-04-10 MED ORDER — HYDROCODONE-ACETAMINOPHEN 5-325 MG PO TABS: 1.0000 | ORAL_TABLET | Freq: Once | ORAL | Status: DC

## 2018-04-10 MED ORDER — HYDRALAZINE HCL 20 MG/ML IJ SOLN: 10.0000 mg | INTRAMUSCULAR | Status: DC | PRN

## 2018-04-10 MED ORDER — LEVETIRACETAM IN NACL 1500 MG/100ML IV SOLN
1500.0000 mg | Freq: Once | INTRAVENOUS | Status: AC
  Administered 2018-04-10: 1500 mg via INTRAVENOUS
  Filled 2018-04-10: qty 100

## 2018-04-10 MED ORDER — ALBUTEROL SULFATE (2.5 MG/3ML) 0.083% IN NEBU: 3.0000 mL | INHALATION_SOLUTION | RESPIRATORY_TRACT | Status: DC | PRN

## 2018-04-10 MED ORDER — VITAMIN D3 25 MCG (1000 UNIT) PO TABS
1000.0000 [IU] | ORAL_TABLET | Freq: Every day | ORAL | Status: DC
  Administered 2018-04-11 – 2018-04-12 (×2): 1000 [IU] via ORAL
  Filled 2018-04-10 (×2): qty 1

## 2018-04-10 MED ORDER — LORAZEPAM 2 MG/ML IJ SOLN
1.0000 mg | Freq: Once | INTRAMUSCULAR | Status: AC
  Administered 2018-04-10: 1 mg via INTRAVENOUS
  Filled 2018-04-10: qty 1

## 2018-04-10 MED ORDER — CLINDAMYCIN HCL 150 MG PO CAPS
300.0000 mg | ORAL_CAPSULE | Freq: Three times a day (TID) | ORAL | Status: DC
  Administered 2018-04-10 – 2018-04-12 (×5): 300 mg via ORAL
  Filled 2018-04-10 (×7): qty 2

## 2018-04-10 MED ORDER — LORAZEPAM 2 MG/ML IJ SOLN: 2.0000 mg | INTRAMUSCULAR | Status: DC | PRN

## 2018-04-10 MED ORDER — POTASSIUM CHLORIDE CRYS ER 20 MEQ PO TBCR
20.0000 meq | EXTENDED_RELEASE_TABLET | Freq: Every day | ORAL | Status: DC
  Administered 2018-04-11 – 2018-04-12 (×2): 20 meq via ORAL
  Filled 2018-04-10 (×2): qty 1

## 2018-04-10 MED ORDER — PHENOBARBITAL 32.4 MG PO TABS
194.4000 mg | ORAL_TABLET | Freq: Every day | ORAL | Status: DC
  Administered 2018-04-10 – 2018-04-11 (×2): 194.4 mg via ORAL
  Filled 2018-04-10 (×2): qty 6

## 2018-04-10 MED ORDER — ATORVASTATIN CALCIUM 20 MG PO TABS
40.0000 mg | ORAL_TABLET | Freq: Every day | ORAL | Status: DC
  Administered 2018-04-11 – 2018-04-12 (×2): 40 mg via ORAL
  Filled 2018-04-10 (×2): qty 2

## 2018-04-10 MED ORDER — PANTOPRAZOLE SODIUM 40 MG PO TBEC
40.0000 mg | DELAYED_RELEASE_TABLET | Freq: Every day | ORAL | Status: DC
  Administered 2018-04-11 – 2018-04-12 (×2): 40 mg via ORAL
  Filled 2018-04-10 (×2): qty 1

## 2018-04-10 MED ORDER — SODIUM CHLORIDE 0.9% FLUSH
3.0000 mL | Freq: Two times a day (BID) | INTRAVENOUS | Status: DC
  Administered 2018-04-10 – 2018-04-12 (×4): 3 mL via INTRAVENOUS

## 2018-04-10 MED ORDER — ONDANSETRON HCL 4 MG PO TABS: 4.0000 mg | ORAL_TABLET | Freq: Four times a day (QID) | ORAL | Status: DC | PRN

## 2018-04-10 MED ORDER — BACITRACIN ZINC 500 UNIT/GM EX OINT
TOPICAL_OINTMENT | Freq: Once | CUTANEOUS | Status: AC
  Administered 2018-04-10: 1 via TOPICAL
  Filled 2018-04-10: qty 0.9

## 2018-04-10 MED ORDER — PHENOBARBITAL 100 MG PO TABS: 200.0000 mg | ORAL_TABLET | Freq: Every day | ORAL | Status: DC

## 2018-04-10 MED ORDER — LEVETIRACETAM IN NACL 500 MG/100ML IV SOLN: 500.0000 mg | Freq: Two times a day (BID) | INTRAVENOUS | Status: DC

## 2018-04-10 MED ORDER — ONDANSETRON HCL 4 MG/2ML IJ SOLN: 4.0000 mg | Freq: Four times a day (QID) | INTRAMUSCULAR | Status: DC | PRN

## 2018-04-10 MED ORDER — HYDROCODONE-ACETAMINOPHEN 5-325 MG PO TABS: 1.0000 | ORAL_TABLET | ORAL | 0 refills | Status: DC | PRN

## 2018-04-10 MED ORDER — SENNOSIDES-DOCUSATE SODIUM 8.6-50 MG PO TABS: 1.0000 | ORAL_TABLET | Freq: Every evening | ORAL | Status: DC | PRN

## 2018-04-10 MED ORDER — SODIUM CHLORIDE 0.9% FLUSH: 3.0000 mL | INTRAVENOUS | Status: DC | PRN
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
## 2018-04-10 NOTE — ED Notes (Signed)
EKG completed

## 2018-04-10 NOTE — ED Notes (Signed)
Seizure pads in place. Bed in lowest position. Pt A&Ox4. Face is bloody but bleeding has stopped.

## 2018-04-10 NOTE — ED Notes (Signed)
ED TO INPATIENT HANDOFF REPORT  Name/Age/Gender Seth Tucker 53 y.o. male  Code Status Code Status History    Date Active Date Inactive Code Status Order ID Comments User Context   05/03/2015 2321 05/05/2015 1552 Full Code [id number]  Jacinta Shoe Inpatient   04/12/2015 1617 04/14/2015 2024 Full Code [id number]  Meredith Pel, NP ED      Home/SNF/Other Home  Chief Complaint seizure  Level of Care/Admitting Diagnosis ED Disposition    ED Disposition Condition Comment   Admit  Hospital Area: [facility] [[id number]]  Level of Care: Med-Surg [16]  Diagnosis: Seizure [facility]) [[id number]]  Admitting Physician: Ihor Austin [[id number]]  Attending Physician: Ihor Austin [[id number]]  Estimated length of stay: past midnight tomorrow  Certification:: I certify this patient will need inpatient services for at least 2 midnights  PT Class (Do Not Modify): Inpatient [101]  PT Acc Code (Do Not Modify): Private [1]       Medical History Past Medical History:  Diagnosis Date  . Depression   . Enlarged heart   . Hyperlipidemia   . Seizure (HCC)   . Sleep apnea   . Vitamin D deficiency     Allergies Allergies  Allergen Reactions  . Bee Venom Anaphylaxis and Swelling  . Hornet Venom Shortness Of Breath and Swelling  . Peanuts [Peanut Oil] Anaphylaxis  . Phenytoin Hives and Rash  . Valproic Acid Hives and Rash  . Eggs Or Egg-Derived Products   . Influenza Vaccines Other (See Comments)    Seizures Seizures Seizures  . Other Other (See Comments)    Seizures    IV Location/Drains/Wounds Patient Lines/Drains/Airways Status   Active Line/Drains/Airways    Name:   Placement date:   Placement time:   Site:   Days:   Peripheral IV 04/10/18 Right Antecubital   04/10/18    1908    Antecubital   less than 1   Incision (Closed) 05/04/15 Chest Left   05/04/15    1845     1072          Labs/Imaging Results for orders placed or performed during the  hospital encounter of 04/10/18 (from the past 48 hour(s))  CBC with Differential/Platelet     Status: Abnormal   Collection Time: 04/10/18  2:19 PM  Result Value Ref Range   WBC 13.7 (H) 4.0 - 10.5 K/uL   RBC 5.00 4.22 - 5.81 MIL/uL   Hemoglobin 14.6 13.0 - 17.0 g/dL   HCT 37.1 06.2 - 69.4 %   MCV 90.8 80.0 - 100.0 fL   MCH 29.2 26.0 - 34.0 pg   MCHC 32.2 30.0 - 36.0 g/dL   RDW 85.4 62.7 - 03.5 %   Platelets 233 150 - 400 K/uL   nRBC 0.0 0.0 - 0.2 %   Neutrophils Relative % 60 %   Neutro Abs 8.2 (H) 1.7 - 7.7 K/uL   Lymphocytes Relative 31 %   Lymphs Abs 4.2 (H) 0.7 - 4.0 K/uL   Monocytes Relative 8 %   Monocytes Absolute 1.1 (H) 0.1 - 1.0 K/uL   Eosinophils Relative 1 %   Eosinophils Absolute 0.1 0.0 - 0.5 K/uL   Basophils Relative 0 %   Basophils Absolute 0.0 0.0 - 0.1 K/uL   Immature Granulocytes 0 %   Abs Immature Granulocytes 0.04 0.00 - 0.07 K/uL    Comment: Performed at [facility], [address] [place], [place] [address]  Basic metabolic panel  Status: Abnormal   Collection Time: 04/10/18  2:19 PM  Result Value Ref Range   Sodium 140 135 - 145 mmol/L   Potassium 3.8 3.5 - 5.1 mmol/L   Chloride 106 98 - 111 mmol/L   CO2 23 22 - 32 mmol/L   Glucose, Bld 131 (H) 70 - 99 mg/dL   BUN 11 6 - 20 mg/dL   Creatinine, Ser 1.610.87 0.61 - 1.24 mg/dL   Calcium 9.3 8.9 - 09.610.3 mg/dL   GFR calc non Af Amer >60 >60 mL/min   GFR calc Af Amer >60 >60 mL/min   Anion gap 11 5 - 15    Comment: Performed at [facility], [address] [place], [place] [id number]   Ct Head Wo Contrast  Result Date: 04/10/2018 CLINICAL DATA:  Seizure with witnessed fall. EXAM: CT HEAD WITHOUT CONTRAST TECHNIQUE: Contiguous axial images were obtained from the base of the skull through the vertex without intravenous contrast. COMPARISON:  04/12/2015 FINDINGS: Brain: No evidence of acute infarction, hemorrhage, hydrocephalus, extra-axial collection or mass lesion/mass effect. Focal 7  mm low-attenuation over the right side of the pons likely chronic ischemic change. Vascular: No hyperdense vessel or unexpected calcification. Skull: Normal. Negative for fracture or focal lesion. Sinuses/Orbits: Orbits are normal. Opacification over the superior aspect of the right maxillary sinus. Other: None. IMPRESSION: No acute findings. Focal low-attenuation over the right side of the pons likely chronic ischemic change. Chronic inflammatory change of the right maxillary sinus. Electronically Signed   By: Elberta Fortisaniel  Boyle M.D.   On: 04/10/2018 15:04   Ct Maxillofacial Wo Contrast  Result Date: 04/10/2018 CLINICAL DATA:  Witnessed fall today/seizure. EXAM: CT MAXILLOFACIAL WITHOUT CONTRAST TECHNIQUE: Multidetector CT imaging of the maxillofacial structures was performed. Multiplanar CT image reconstructions were also generated. COMPARISON:  CT head/cervical spine 04/12/2015. FINDINGS: Osseous: Possible subtle nondisplaced fracture versus chronic changes involving the lateral wall the right maxillary sinus. Old nasal bone fracture unchanged from 2017. Orbits: Normal and symmetric. Sinuses: Mild mucosal membrane thickening over the left frontoethmoidal recess and minimal opacification over the ethmoid air cells. Complete opacification of the right maxillary sinus and ostiomeatal complex. Minimal mucosal membrane thickening over the floor the left maxillary sinus. Mastoid air cells are clear. Soft tissues: Unremarkable. Limited intracranial: Unremarkable. IMPRESSION: Possible subtle nondisplaced fracture along the lateral wall of the right maxillary sinus although these findings may be chronic. Old nasal bone fracture. Chronic sinus inflammatory change with complete opacification of the right maxillary sinus. Electronically Signed   By: Elberta Fortisaniel  Boyle M.D.   On: 04/10/2018 18:20    Pending Labs Unresulted Labs (From admission, onward)    Start     Ordered   04/10/18 1923  Basic metabolic panel  ONCE - STAT,    STAT     04/10/18 1922   04/10/18 1923  Troponin I - ONCE - STAT  ONCE - STAT,   STAT     04/10/18 1922   04/10/18 1913  Lamotrigine level  Once,   STAT     04/10/18 1912   Signed and Held  HIV antibody (Routine Testing)  Once,   R     Signed and Held   Signed and Held  CBC  (enoxaparin (LOVENOX)    CrCl >/= 30 ml/min)  Once,   R    Comments:  Baseline for enoxaparin therapy IF NOT ALREADY DRAWN.  Notify MD if PLT < 100 K.    Signed and Held   Signed and Held  Creatinine, serum  (enoxaparin (LOVENOX)    CrCl >/= 30 ml/min)  Once,   R    Comments:  Baseline for enoxaparin therapy IF NOT ALREADY DRAWN.    Signed and Held   Signed and Held  Creatinine, serum  (enoxaparin (LOVENOX)    CrCl >/= 30 ml/min)  Weekly,   R    Comments:  while on enoxaparin therapy    Signed and Held   Signed and Held  Basic metabolic panel  Tomorrow morning,   R     Signed and Held   Signed and Held  CBC  Tomorrow morning,   R     Signed and Held          Vitals/Pain Today's Vitals   04/10/18 1907 04/10/18 1912 04/10/18 1916 04/10/18 1928  BP: (!) 209/94   (!) 158/60  Pulse: (!) 109   94  Resp: (!) 24   18  Temp: 97.8 F (36.6 C)     TempSrc: Axillary     SpO2: 98%   97%  Weight:  108.9 kg    Height:  5\' 10"  (1.778 m)    PainSc:  0-No pain 0-No pain     Isolation Precautions No active isolations  Medications Medications  LORazepam (ATIVAN) injection 2 mg (has no administration in time range)  levETIRAcetam (KEPPRA) IVPB 500 mg/100 mL premix (has no administration in time range)  hydrALAZINE (APRESOLINE) injection 10 mg (has no administration in time range)  LORazepam (ATIVAN) injection 1 mg (1 mg Intravenous Given 04/10/18 1925)  levETIRAcetam (KEPPRA) IVPB 1500 mg/ 100 mL premix (0 mg Intravenous Stopped 04/10/18 1955)    Mobility walks

## 2018-04-10 NOTE — ED Triage Notes (Signed)
Pt arrives to ED via POV immediately following d/c. Pt was found unresponsive with seizure-like activity in a car at the front entrance. Pt with bleeding from the mouth and snoring respirations. Dr Sharma Covert and Dr Roxan Hockey at bedside upon pt's arrival to ED Rm 5.

## 2018-04-10 NOTE — ED Triage Notes (Signed)
Pt in via EMS for seizure and then fall witnessed by wife. Pt history of seizures but hasn't had one in 4 years per pt. L ac Iv started by EMS. VSS. HTN per EMS.

## 2018-04-10 NOTE — ED Notes (Signed)
Pt snoring

## 2018-04-10 NOTE — H&P (Signed)
[facility] [place] [facility]   PATIENT NAME: Seth Tucker    MR#:  [id number]  DATE OF BIRTH:  [birth year]  DATE OF ADMISSION:  04/10/2018  PRIMARY CARE PHYSICIAN: Patient, No Pcp Per   REQUESTING/REFERRING PHYSICIAN:   CHIEF COMPLAINT:   Chief Complaint  Patient presents with  . Seizures    HISTORY OF PRESENT ILLNESS: Seth Tucker  is a 53 y.o. male with a known history of seizure disorder, hyperlipidemia, sleep apnea, vitamin D deficiency presented to the emergency room for seizure.  Patient had 3 seizures prior to arrival.  He was evaluated in the emergency room and was loaded with IV Keppra.  Plan was to send him home but patient again had a seizure in the parking lot when he was trying to get into his vehicle.  Patient was brought back to the emergency room.  He is lethargic and sleepy secondary to IV Ativan.  Patient also has some abrasion near the nose and in the on the face secondary to first seizure at home.  Neysa Bonito was available at bedside who said patient has been compliant with meds.  PAST MEDICAL HISTORY:   Past Medical History:  Diagnosis Date  . Depression   . Enlarged heart   . Hyperlipidemia   . Seizure (HCC)   . Sleep apnea   . Vitamin D deficiency     PAST SURGICAL HISTORY:  Past Surgical History:  Procedure Laterality Date  . CARDIAC CATHETERIZATION N/A 05/04/2015   Procedure: Left Heart Cath and Coronary Angiography;  Surgeon: Iran Ouch, MD;  Location: [facility] INVASIVE CV LAB;  Service: Cardiovascular;  Laterality: N/A;  . EP IMPLANTABLE DEVICE N/A 05/04/2015   Procedure: Pacemaker Implant;  Surgeon: Marinus Maw, MD;  Location: [facility] INVASIVE CV LAB;  Service: Cardiovascular;  Laterality: N/A;    SOCIAL HISTORY:  Social History   Tobacco Use  . Smoking status: Current Every Day Smoker    Packs/day: 0.50    Years: 20.00    Pack years: 10.00    Types: Cigarettes  . Smokeless tobacco: Never Used  . Tobacco comment: 6 a day.   Substance Use Topics  . Alcohol use: No    FAMILY HISTORY:  Family History  Problem Relation Age of Onset  . Hypertension Mother   . Emphysema Mother   . Diabetes Other        sibling  . Heart disease Other        sibling  . Hypertension Brother     DRUG ALLERGIES:  Allergies  Allergen Reactions  . Bee Venom Anaphylaxis and Swelling  . Hornet Venom Shortness Of Breath and Swelling  . Peanuts [Peanut Oil] Anaphylaxis  . Phenytoin Hives and Rash  . Valproic Acid Hives and Rash  . Eggs Or Egg-Derived Products   . Influenza Vaccines Other (See Comments)    Seizures Seizures Seizures  . Other Other (See Comments)    Seizures    REVIEW OF SYSTEMS:   CONSTITUTIONAL: No fever, fatigue or weakness.  EYES: No blurred or double vision.  EARS, NOSE, AND THROAT: No tinnitus or ear pain.  RESPIRATORY: No cough, shortness of breath, wheezing or hemoptysis.  CARDIOVASCULAR: No chest pain, orthopnea, edema.  GASTROINTESTINAL: No nausea, vomiting, diarrhea or abdominal pain.  GENITOURINARY: No dysuria, hematuria.  ENDOCRINE: No polyuria, nocturia,  HEMATOLOGY: No anemia, easy bruising or bleeding SKIN: Has abrasion over the upper lip area MUSCULOSKELETAL: No joint pain or arthritis.   NEUROLOGIC:  No tingling, numbness, weakness.  PSYCHIATRY: No anxiety or depression.   MEDICATIONS AT HOME:  Prior to Admission medications   Medication Sig Start Date End Date Taking? Authorizing Provider  albuterol (PROVENTIL HFA;VENTOLIN HFA) 108 (90 Base) MCG/ACT inhaler Inhale 2 puffs into the lungs every 4 (four) hours as needed for wheezing or shortness of breath.    [provider]  aspirin EC 81 MG EC tablet Take 1 tablet (81 mg total) by mouth daily. 05/05/15   Sheilah Pigeon, PA-C  atorvastatin (LIPITOR) 40 MG tablet Take 40 mg by mouth daily.  02/25/15 05/07/17  [provider]  carvedilol (COREG) 3.125 MG tablet Take 1 tablet (3.125 mg total) by mouth 2 (two) times  daily. 06/10/15   Marinus Maw, MD  cholecalciferol (VITAMIN D) 1000 units tablet Take 1,000 Units by mouth daily.    [provider]  clindamycin (CLEOCIN) 300 MG capsule Take 1 capsule (300 mg total) by mouth 3 (three) times daily for 7 days. 04/10/18 04/17/18  Willy Eddy, MD  FLUoxetine (PROZAC) 40 MG capsule Take 40 mg by mouth daily.    [provider]  Fluticasone-Salmeterol (ADVAIR DISKUS) 250-50 MCG/DOSE AEPB Inhale 1 puff into the lungs 2 (two) times daily.    [provider]  furosemide (LASIX) 40 MG tablet Take 1.5 tablets (60 mg total) by mouth 2 (two) times daily. 03/21/17 06/19/17  End, Cristal Deer, MD  HYDROcodone-acetaminophen (NORCO) 5-325 MG tablet Take 1 tablet by mouth every 4 (four) hours as needed for moderate pain. 04/10/18   Willy Eddy, MD  ibuprofen (ADVIL,MOTRIN) 800 MG tablet Take 800 mg by mouth every 8 (eight) hours as needed (pain).    [provider]  lamoTRIgine (LAMICTAL) 25 MG CHEW chewable tablet Chew 50 mg by mouth at bedtime.     [provider]  nitroGLYCERIN (NITROSTAT) 0.4 MG SL tablet Place 1 tablet (0.4 mg total) under the tongue every 5 (five) minutes as needed for chest pain. 04/28/15   Almond Lint, MD  omeprazole (PRILOSEC) 20 MG capsule Take 40 mg by mouth daily as needed (for heartburn). Reported on 05/03/2015 02/25/15 03/20/17  [provider]  PHENObarbital (LUMINAL) 100 MG tablet Take 200 mg by mouth at bedtime.     [provider]  potassium chloride SA (K-DUR,KLOR-CON) 20 MEQ tablet Take 1 tablet (20 mEq) by mouth once a day. 03/21/17   End, Cristal Deer, MD      PHYSICAL EXAMINATION:   VITAL SIGNS: Blood pressure (!) 158/60, pulse 94, temperature 97.8 F (36.6 C), temperature source Axillary, resp. rate 18, height 5\' 10"  (1.778 m), weight 108.9 kg, SpO2 97 %.  GENERAL:  53 y.o.-year-old patient lying in the bed with no acute distress.  EYES: Pupils equal, round, reactive to  light and accommodation. No scleral icterus. Extraocular muscles intact.  HEENT: Head normocephalic. Oropharynx dry and nasopharynx clear.  NECK:  Supple, no jugular venous distention. No thyroid enlargement, no tenderness.  LUNGS: Normal breath sounds bilaterally, no wheezing, rales,rhonchi or crepitation. No use of accessory muscles of respiration.  CARDIOVASCULAR: S1, S2 normal. No murmurs, rubs, or gallops.  ABDOMEN: Soft, nontender, nondistended. Bowel sounds present. No organomegaly or mass.  EXTREMITIES: No pedal edema, cyanosis, or clubbing.  NEUROLOGIC: Cranial nerves II through XII are intact. Muscle strength 5/5 in all extremities. Sensation intact. Gait not checked.  PSYCHIATRIC: The patient is alert and oriented x 2  SKIN: Abrasion noted over the upper lip  LABORATORY PANEL:   CBC  Recent Labs  Lab 04/10/18 1419  WBC 13.7*  HGB 14.6  HCT 45.4  PLT 233  MCV 90.8  MCH 29.2  MCHC 32.2  RDW 13.8  LYMPHSABS 4.2*  MONOABS 1.1*  EOSABS 0.1  BASOSABS 0.0   ------------------------------------------------------------------------------------------------------------------  Chemistries  Recent Labs  Lab 04/10/18 1419  NA 140  K 3.8  CL 106  CO2 23  GLUCOSE 131*  BUN 11  CREATININE 0.87  CALCIUM 9.3   ------------------------------------------------------------------------------------------------------------------ estimated creatinine clearance is 120 mL/min (by C-G formula based on SCr of 0.87 mg/dL). ------------------------------------------------------------------------------------------------------------------ No results for input(s): TSH, T4TOTAL, T3FREE, THYROIDAB in the last 72 hours.  Invalid input(s): FREET3   Coagulation profile No results for input(s): INR, PROTIME in the last 168 hours. ------------------------------------------------------------------------------------------------------------------- No results for input(s): DDIMER in the last 72  hours. -------------------------------------------------------------------------------------------------------------------  Cardiac Enzymes No results for input(s): CKMB, TROPONINI, MYOGLOBIN in the last 168 hours.  Invalid input(s): CK ------------------------------------------------------------------------------------------------------------------ Invalid input(s): POCBNP  ---------------------------------------------------------------------------------------------------------------  Urinalysis No results found for: COLORURINE, APPEARANCEUR, LABSPEC, PHURINE, GLUCOSEU, HGBUR, BILIRUBINUR, KETONESUR, PROTEINUR, UROBILINOGEN, NITRITE, LEUKOCYTESUR   RADIOLOGY: Ct Head Wo Contrast  Result Date: 04/10/2018 CLINICAL DATA:  Seizure with witnessed fall. EXAM: CT HEAD WITHOUT CONTRAST TECHNIQUE: Contiguous axial images were obtained from the base of the skull through the vertex without intravenous contrast. COMPARISON:  04/12/2015 FINDINGS: Brain: No evidence of acute infarction, hemorrhage, hydrocephalus, extra-axial collection or mass lesion/mass effect. Focal 7 mm low-attenuation over the right side of the pons likely chronic ischemic change. Vascular: No hyperdense vessel or unexpected calcification. Skull: Normal. Negative for fracture or focal lesion. Sinuses/Orbits: Orbits are normal. Opacification over the superior aspect of the right maxillary sinus. Other: None. IMPRESSION: No acute findings. Focal low-attenuation over the right side of the pons likely chronic ischemic change. Chronic inflammatory change of the right maxillary sinus. Electronically Signed   By: Elberta Fortisaniel  Boyle M.D.   On: 04/10/2018 15:04   Ct Maxillofacial Wo Contrast  Result Date: 04/10/2018 CLINICAL DATA:  Witnessed fall today/seizure. EXAM: CT MAXILLOFACIAL WITHOUT CONTRAST TECHNIQUE: Multidetector CT imaging of the maxillofacial structures was performed. Multiplanar CT image reconstructions were also generated.  COMPARISON:  CT head/cervical spine 04/12/2015. FINDINGS: Osseous: Possible subtle nondisplaced fracture versus chronic changes involving the lateral wall the right maxillary sinus. Old nasal bone fracture unchanged from 2017. Orbits: Normal and symmetric. Sinuses: Mild mucosal membrane thickening over the left frontoethmoidal recess and minimal opacification over the ethmoid air cells. Complete opacification of the right maxillary sinus and ostiomeatal complex. Minimal mucosal membrane thickening over the floor the left maxillary sinus. Mastoid air cells are clear. Soft tissues: Unremarkable. Limited intracranial: Unremarkable. IMPRESSION: Possible subtle nondisplaced fracture along the lateral wall of the right maxillary sinus although these findings may be chronic. Old nasal bone fracture. Chronic sinus inflammatory change with complete opacification of the right maxillary sinus. Electronically Signed   By: Elberta Fortisaniel  Boyle M.D.   On: 04/10/2018 18:20    EKG: Orders placed or performed during the hospital encounter of 04/10/18  . ED EKG  . ED EKG    IMPRESSION AND PLAN: 53 year old male patient with a known history of seizure disorder, hyperlipidemia, sleep apnea, vitamin D deficiency presented to the emergency room for seizure.    -Breakthrough seizure Admit patient to medical floor IV Keppra 500 every 12 hourly Neurology consultation IV Ativan as needed for seizures Resume phenobarbital and Lamictal Check EEG  -Sleep apnea CPAP at bedtime once stable  -DVT prophylaxis subcu Lovenox daily  -Hyperlipidemia Continue  statin medication  -Tobacco abuse Tobacco cessation counseled to the patient for 6 minutes Nicotine patch offered  All the records are reviewed and case discussed with ED provider. Management plans discussed with the patient, family and they are in agreement.  CODE STATUS:Full code Code Status History    Date Active Date Inactive Code Status Order ID Comments User  Context   05/03/2015 2321 05/05/2015 1552 Full Code [id number]  Jacinta Shoe Inpatient   04/12/2015 1617 04/14/2015 2024 Full Code [id number]  Meredith Pel, NP ED      TOTAL TIME TAKING CARE OF THIS PATIENT: 53 minutes.    Ihor Austin M.D on 04/10/2018 at 8:21 PM  Between 7am to 6pm - Pager - [phone]  After 6pm go to [email or web address] - password [email or web address]  [facility]  Office  [phone]  CC: Primary care physician; Patient, No Pcp Per

## 2018-04-10 NOTE — ED Notes (Signed)
Pt's lip swollen. Pt's face cleansed with sterile NS & gauze; bacitracin applied.
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
## 2018-04-10 NOTE — ED Notes (Signed)
Pt walked hallways with this RN. Steady. C/o knees being sore from fall. Denies dizziness, SOB, or any other symptoms. Pt swished water around in mouth to clean it out. Unremarkable outside of lip swelling.

## 2018-04-10 NOTE — ED Notes (Signed)
Pt to CT now

## 2018-04-11 DIAGNOSIS — R569 Unspecified convulsions: Secondary | ICD-10-CM

## 2018-04-11 LAB — CBC
HCT: 42.4 % (ref 39.0–52.0)
Hemoglobin: 13.6 g/dL (ref 13.0–17.0)
MCH: 28.9 pg (ref 26.0–34.0)
MCHC: 32.1 g/dL (ref 30.0–36.0)
MCV: 90.2 fL (ref 80.0–100.0)
Platelets: 218 10*3/uL (ref 150–400)
RBC: 4.7 MIL/uL (ref 4.22–5.81)
RDW: 13.9 % (ref 11.5–15.5)
WBC: 12.8 10*3/uL — ABNORMAL HIGH (ref 4.0–10.5)
nRBC: 0 % (ref 0.0–0.2)

## 2018-04-11 LAB — URINE DRUG SCREEN, QUALITATIVE (ARMC ONLY)
Amphetamines, Ur Screen: NOT DETECTED
BARBITURATES, UR SCREEN: POSITIVE — AB
Benzodiazepine, Ur Scrn: NOT DETECTED
Cannabinoid 50 Ng, Ur ~~LOC~~: NOT DETECTED
Cocaine Metabolite,Ur ~~LOC~~: NOT DETECTED
MDMA (Ecstasy)Ur Screen: NOT DETECTED
Methadone Scn, Ur: NOT DETECTED
Opiate, Ur Screen: NOT DETECTED
Phencyclidine (PCP) Ur S: NOT DETECTED
Tricyclic, Ur Screen: NOT DETECTED

## 2018-04-11 LAB — BASIC METABOLIC PANEL
Anion gap: 7 (ref 5–15)
BUN: 10 mg/dL (ref 6–20)
CO2: 23 mmol/L (ref 22–32)
Calcium: 9 mg/dL (ref 8.9–10.3)
Chloride: 109 mmol/L (ref 98–111)
Creatinine, Ser: 0.7 mg/dL (ref 0.61–1.24)
GFR calc Af Amer: >60 mL/min (ref >60)
GFR calc non Af Amer: >60 mL/min (ref >60)
GLUCOSE: 99 mg/dL (ref 70–99)
Potassium: 4.2 mmol/L (ref 3.5–5.1)
Sodium: 139 mmol/L (ref 135–145)

## 2018-04-11 LAB — GLUCOSE, CAPILLARY: Glucose-Capillary: 124 mg/dL — ABNORMAL HIGH (ref 70–99)

## 2018-04-11 MED ORDER — LAMOTRIGINE 100 MG PO TABS
50.0000 mg | ORAL_TABLET | Freq: Two times a day (BID) | ORAL | Status: DC
  Administered 2018-04-11 – 2018-04-12 (×3): 50 mg via ORAL
  Filled 2018-04-11 (×3): qty 1

## 2018-04-11 MED ORDER — LEVETIRACETAM 500 MG PO TABS
500.0000 mg | ORAL_TABLET | Freq: Two times a day (BID) | ORAL | Status: DC
  Administered 2018-04-11: 500 mg via ORAL
  Filled 2018-04-11: qty 1

## 2018-04-11 NOTE — Progress Notes (Signed)
    [employer] rep interrogated the device which showed sinus rhythm with heart rates in the 80s, rare pacing. No arrhythmias or events. Leads are functioning normally. No etiology of symptoms noted on device.

## 2018-04-11 NOTE — Progress Notes (Signed)
Patient ID: Seth Tucker, male   DOB: [birth year], 53 y.o.   MRN: [id number]  [employer] PROGRESS NOTE  Seth Tucker AVW:[id number] DOB: [birth year] DOA: 04/10/2018 PCP: Patient, No Pcp Per  HPI/Subjective: Patient was sleeping hard when I went in the room.  As per fianc he had a rough night.  He was able to wake up but answered a few questions and went back to sleep.  Having a lot of facial pain.  Family asking to interrogate the pacer.  Objective: Vitals:   04/11/18 0319 04/11/18 0816  BP: (!) 159/87 (!) 144/79  Pulse: 72 74  Resp:    Temp:  98.4 F (36.9 C)  SpO2: 97% 96%    Filed Weights   04/10/18 1912 04/10/18 2229  Weight: 108.9 kg 112.5 kg    ROS: Review of Systems  Unable to perform ROS: Acuity of condition  Respiratory: Negative for shortness of breath.   Cardiovascular: Negative for chest pain.  Gastrointestinal: Negative for abdominal pain.  Musculoskeletal: Positive for falls.   Exam: Physical Exam  Constitutional: He appears lethargic.  Cardiovascular: Regular rhythm, S1 normal, S2 normal and normal heart sounds.  Pulses:      Dorsalis pedis pulses are 1+ on the right side and 1+ on the left side.  Respiratory: He has decreased breath sounds in the right lower field. He has no wheezes. He has no rhonchi. He has no rales.  GI: Soft. Bowel sounds are normal. There is no abdominal tenderness.  Musculoskeletal:     Right ankle: He exhibits no swelling.     Left ankle: He exhibits no swelling.  Lymphadenopathy:    He has no cervical adenopathy.  Neurological: He appears lethargic.  Was able to be woken up and followed some simple commands.  Went back to sleep.  Power 5 out of 5 upper and lower extremities.  Skin: Skin is warm. No rash noted. Nails show no clubbing.  Psychiatric:  Lethargic today      Data Reviewed: Basic Metabolic Panel: Recent Labs  Lab 04/10/18 1419 04/10/18 1909 04/11/18 0614  NA 140 141 139  K 3.8 3.7 4.2  CL 106  108 109  CO2 23 15* 23  GLUCOSE 131* 133* 99  BUN 11 12 10   CREATININE 0.87 0.96 0.70  CALCIUM 9.3 9.6 9.0   Liver Function Tests: No results for input(s): AST, ALT, ALKPHOS, BILITOT, PROT, ALBUMIN in the last 168 hours. No results for input(s): LIPASE, AMYLASE in the last 168 hours. No results for input(s): AMMONIA in the last 168 hours. CBC: Recent Labs  Lab 04/10/18 1419 04/11/18 0614  WBC 13.7* 12.8*  NEUTROABS 8.2*  --   HGB 14.6 13.6  HCT 45.4 42.4  MCV 90.8 90.2  PLT 233 218   Cardiac Enzymes: Recent Labs  Lab 04/10/18 1909  TROPONINI <0.03   BNP (last 3 results) No results for input(s): BNP in the last 8760 hours.  ProBNP (last 3 results) No results for input(s): PROBNP in the last 8760 hours.  CBG: Recent Labs  Lab 04/10/18 1903  GLUCAP 124*    No results found for this or any previous visit (from the past 240 hour(s)).   Studies: Ct Head Wo Contrast  Result Date: 04/10/2018 CLINICAL DATA:  Seizure with witnessed fall. EXAM: CT HEAD WITHOUT CONTRAST TECHNIQUE: Contiguous axial images were obtained from the base of the skull through the vertex without intravenous contrast. COMPARISON:  04/12/2015 FINDINGS: Brain: No evidence of acute infarction, hemorrhage,  hydrocephalus, extra-axial collection or mass lesion/mass effect. Focal 7 mm low-attenuation over the right side of the pons likely chronic ischemic change. Vascular: No hyperdense vessel or unexpected calcification. Skull: Normal. Negative for fracture or focal lesion. Sinuses/Orbits: Orbits are normal. Opacification over the superior aspect of the right maxillary sinus. Other: None. IMPRESSION: No acute findings. Focal low-attenuation over the right side of the pons likely chronic ischemic change. Chronic inflammatory change of the right maxillary sinus. Electronically Signed   By: Elberta Fortis M.D.   On: 04/10/2018 15:04   Ct Maxillofacial Wo Contrast  Result Date: 04/10/2018 CLINICAL DATA:  Witnessed  fall today/seizure. EXAM: CT MAXILLOFACIAL WITHOUT CONTRAST TECHNIQUE: Multidetector CT imaging of the maxillofacial structures was performed. Multiplanar CT image reconstructions were also generated. COMPARISON:  CT head/cervical spine 04/12/2015. FINDINGS: Osseous: Possible subtle nondisplaced fracture versus chronic changes involving the lateral wall the right maxillary sinus. Old nasal bone fracture unchanged from 2017. Orbits: Normal and symmetric. Sinuses: Mild mucosal membrane thickening over the left frontoethmoidal recess and minimal opacification over the ethmoid air cells. Complete opacification of the right maxillary sinus and ostiomeatal complex. Minimal mucosal membrane thickening over the floor the left maxillary sinus. Mastoid air cells are clear. Soft tissues: Unremarkable. Limited intracranial: Unremarkable. IMPRESSION: Possible subtle nondisplaced fracture along the lateral wall of the right maxillary sinus although these findings may be chronic. Old nasal bone fracture. Chronic sinus inflammatory change with complete opacification of the right maxillary sinus. Electronically Signed   By: Elberta Fortis M.D.   On: 04/10/2018 18:20    Scheduled Meds: . aspirin EC  81 mg Oral Daily  . atorvastatin  40 mg Oral Daily  . carvedilol  3.125 mg Oral BID  . cholecalciferol  1,000 Units Oral Daily  . clindamycin  300 mg Oral TID  . enoxaparin (LOVENOX) injection  40 mg Subcutaneous Q24H  . FLUoxetine  40 mg Oral Daily  . furosemide  60 mg Oral BID  . lamoTRIgine  50 mg Oral BID  . levETIRAcetam  500 mg Oral BID  . mometasone-formoterol  2 puff Inhalation BID  . pantoprazole  40 mg Oral Daily  . PHENobarbital  194.4 mg Oral QHS  . potassium chloride SA  20 mEq Oral Daily  . sodium chloride flush  3 mL Intravenous Q12H   Continuous Infusions: . sodium chloride      Assessment/Plan:  1. Seizure.  Patient has a pacemaker and unable to get an MRI.  Since the patient is little lethargic I  will watch overnight.  Seen by neurology and they increased Lamictal.  Was given Keppra when he came in.  Awaiting phenobarbital level.  EEG pending 2. Facial trauma with right maxillary sinus bone fracture.  PRN pain control. 3. Sleep apnea supposed to wear CPAP but does not.  Check overnight oximetry 4. Depression on fluoxetine 5. Hyperlipidemia unspecified on atorvastatin 6. Pacer interrogated and showed no arrhythmias  Code Status:     Code Status Orders  (From admission, onward)         Start     Ordered   04/10/18 2231  Full code  Continuous     04/10/18 2230        Code Status History    Date Active Date Inactive Code Status Order ID Comments User Context   05/03/2015 2321 05/05/2015 1552 Full Code [id number]  Jacinta Shoe Inpatient   04/12/2015 1617 04/14/2015 2024 Full Code [id number]  Meredith Pel, NP ED  Family Communication: Spoke with fianc at bedside Disposition Plan: Potentially home tomorrow  Consultants:  Neurology  Time spent: 28 minutes  Seraphina Mitchner [employer]  [employer]

## 2018-04-11 NOTE — Progress Notes (Signed)
    Spoke with [employer] and rep will come out to interrogate the device.

## 2018-04-11 NOTE — Consult Note (Addendum)
Reason for Consult: Seizure Referring Physician: Seizures  CC: Seizures  HPI: Seth Tucker is an 53 y.o. male with past medical history of seizure disorder, intractable chronic migraine without aura, sleep apnea, hyperlipidemia, depression, heart block status post pacemaker placement, and tobacco abuse resenting to the ED on 04/10/2018 with witnessed seizures and subsequent fall.  Patient reports that he has been [facility] neurolog[facility] has not had a seizure in almost 4 years.  Denies recent head trauma, infection, starting new medication, seeing his seizure medications or use of illicit drugs.  Patient does not recall the events prior to or following the seizure episode. On arrival to the ED, he was afebrile with blood pressure 224/114 mm Hg and pulse rate 81 beats/min. There were no focal neurological deficits; he was alert and oriented x4 however he was noted to have abrasion to his face with a laceration of the upper lip. A non-contrast head CT showed no acute intracranial abnormality.  CT maxillary of face showed a probable nondisplaced fracture therefore he was started on antibiotics.  Patient was treated and discharged however he came back to the ED immediately following DC after being found unresponsive with seizure-like activity in a car at the front entrance.  Patient was bleeding from the mouth and noted to have snoring respiration.  Repeat neuro exam was nonfocal therefore patient was given IV Ativan and loaded with Keppra 1 g IV.  Initial labs unremarkable other than slightly elevated white count of 13.7.  Patient was therefore admitted for further evaluation and management.  Past Medical History:  Diagnosis Date  . Depression   . Enlarged heart   . Hyperlipidemia   . Seizure (HCC)   . Sleep apnea   . Vitamin D deficiency     Past Surgical History:  Procedure Laterality Date  . CARDIAC CATHETERIZATION N/A 05/04/2015   Procedure: Left Heart Cath and Coronary Angiography;   Surgeon: Iran OuchMuhammad A Arida, MD;  Location: [facility] INVASIVE CV LAB;  Service: Cardiovascular;  Laterality: N/A;  . EP IMPLANTABLE DEVICE N/A 05/04/2015   Procedure: Pacemaker Implant;  Surgeon: Marinus MawGregg W Taylor, MD;  Location: [facility] INVASIVE CV LAB;  Service: Cardiovascular;  Laterality: N/A;    Family History  Problem Relation Age of Onset  . Hypertension Mother   . Emphysema Mother   . Diabetes Other        sibling  . Heart disease Other        sibling  . Hypertension Brother     Social History:  reports that he has been smoking cigarettes. He has a 10.00 pack-year smoking history. He has never used smokeless tobacco. He reports that he does not drink alcohol or use drugs.  Allergies  Allergen Reactions  . Bee Venom Anaphylaxis and Swelling  . Hornet Venom Shortness Of Breath and Swelling  . Peanuts [Peanut Oil] Anaphylaxis  . Phenytoin Hives and Rash  . Valproic Acid Hives and Rash  . Influenza Vaccines Other (See Comments)    Seizures Seizures Seizures  . Other Other (See Comments)    Seizures    Medications:  I have reviewed the patient's current medications. Prior to Admission:  Medications Prior to Admission  Medication Sig Dispense Refill Last Dose  . albuterol (PROVENTIL HFA;VENTOLIN HFA) 108 (90 Base) MCG/ACT inhaler Inhale 2 puffs into the lungs every 4 (four) hours as needed for wheezing or shortness of breath.   04/10/2018 at prn  . aspirin EC 81 MG EC tablet Take 1 tablet (81  mg total) by mouth daily.   04/10/2018 at 0700  . cholecalciferol (VITAMIN D) 1000 units tablet Take 1,000 Units by mouth daily.   04/10/2018 at 0900  . FLUoxetine (PROZAC) 40 MG capsule Take 40 mg by mouth daily.   04/10/2018 at Unknown time  . furosemide (LASIX) 40 MG tablet Take 1.5 tablets (60 mg total) by mouth 2 (two) times daily. 270 tablet 3 04/10/2018 at 0700  . lamoTRIgine (LAMICTAL) 25 MG CHEW chewable tablet Chew 50 mg by mouth at bedtime.    04/10/2018 at 2100  . PHENObarbital (LUMINAL) 100  MG tablet Take 200 mg by mouth at bedtime.    04/09/2018 at 2100  . potassium chloride SA (K-DUR,KLOR-CON) 20 MEQ tablet Take 1 tablet (20 mEq) by mouth once a day. 90 tablet 3 04/10/2018 at Unknown time  . atorvastatin (LIPITOR) 40 MG tablet Take 40 mg by mouth daily.    Taking  . carvedilol (COREG) 3.125 MG tablet Take 1 tablet (3.125 mg total) by mouth 2 (two) times daily. 180 tablet 3 04/10/2018 at 0900  . clindamycin (CLEOCIN) 300 MG capsule Take 1 capsule (300 mg total) by mouth 3 (three) times daily for 7 days. 21 capsule 0   . Fluticasone-Salmeterol (ADVAIR DISKUS) 250-50 MCG/DOSE AEPB Inhale 1 puff into the lungs 2 (two) times daily.   Taking  . HYDROcodone-acetaminophen (NORCO) 5-325 MG tablet Take 1 tablet by mouth every 4 (four) hours as needed for moderate pain. 6 tablet 0 prn at prn  . ibuprofen (ADVIL,MOTRIN) 800 MG tablet Take 800 mg by mouth every 8 (eight) hours as needed (pain).   Taking  . nitroGLYCERIN (NITROSTAT) 0.4 MG SL tablet Place 1 tablet (0.4 mg total) under the tongue every 5 (five) minutes as needed for chest pain. 25 tablet 3 prn at prn  . omeprazole (PRILOSEC) 20 MG capsule Take 40 mg by mouth daily as needed (for heartburn). Reported on 05/03/2015   Taking   Scheduled: . aspirin EC  81 mg Oral Daily  . atorvastatin  40 mg Oral Daily  . carvedilol  3.125 mg Oral BID  . cholecalciferol  1,000 Units Oral Daily  . clindamycin  300 mg Oral TID  . enoxaparin (LOVENOX) injection  40 mg Subcutaneous Q24H  . FLUoxetine  40 mg Oral Daily  . furosemide  60 mg Oral BID  . lamoTRIgine  50 mg Oral BID  . mometasone-formoterol  2 puff Inhalation BID  . pantoprazole  40 mg Oral Daily  . PHENobarbital  194.4 mg Oral QHS  . potassium chloride SA  20 mEq Oral Daily  . sodium chloride flush  3 mL Intravenous Q12H    ROS: History obtained from the patient   General ROS: negative for - chills, fatigue, fever, night sweats, weight gain or weight loss Psychological ROS: negative  for - behavioral disorder, hallucinations, memory difficulties, mood swings or suicidal ideation Ophthalmic ROS: negative for - blurry vision, double vision, eye pain or loss of vision ENT ROS: negative for - epistaxis, nasal discharge, oral lesions, sore throat, tinnitus or vertigo Allergy and Immunology ROS: negative for - hives or itchy/watery eyes Hematological and Lymphatic ROS: negative for - bleeding problems, bruising or swollen lymph nodes Endocrine ROS: negative for - galactorrhea, hair pattern changes, polydipsia/polyuria or temperature intolerance Respiratory ROS: negative for - cough, hemoptysis, shortness of breath or wheezing Cardiovascular ROS: negative for - chest pain, dyspnea on exertion, edema or irregular heartbeat Gastrointestinal ROS: negative for - abdominal pain, diarrhea,  hematemesis, nausea/vomiting or stool incontinence Genito-Urinary ROS: negative for - dysuria, hematuria, incontinence or urinary frequency/urgency Musculoskeletal ROS: negative for - joint swelling or muscular weakness Neurological ROS: as noted in HPI Dermatological ROS: negative for rash and skin lesion changes  Physical Examination: Blood pressure (!) 144/79, pulse 74, temperature 98.4 F (36.9 C), temperature source Oral, resp. rate 16, height 5\' 10"  (1.778 m), weight 112.5 kg, SpO2 96 %.  HEENT-  Normocephalic, facial bruising with right lip laceration.  Normal external eye and conjunctiva.  Normal TM's bilaterally.  Normal auditory canals and external ears. Normal external nose, mucus membranes and septum.  Normal pharynx. Cardiovascular- S1, S2 normal, pulses palpable throughout   Lungs- chest clear, no wheezing, rales, normal symmetric air entry Abdomen- soft, non-tender; bowel sounds normal; no masses,  no organomegaly Extremities- no edema Lymph-no adenopathy palpable Musculoskeletal-no joint tenderness, deformity or swelling Skin-warm and dry, no hyperpigmentation, vitiligo, or  suspicious lesions  Neurological Exam   Mental Status: Alert, oriented, thought content appropriate.  Speech fluent without evidence of aphasia.  Able to follow 3 step commands without difficulty. Attention span and concentration seemed appropriate  Cranial Nerves: II: Discs flat bilaterally; Visual fields grossly normal, pupils equal, round, reactive to light and accommodation III,IV, VI: ptosis not present, extra-ocular motions intact bilaterally V,VII: smile symmetric, facial light touch sensation intact VIII: hearing normal bilaterally IX,X: gag reflex present XI: bilateral shoulder shrug XII: midline tongue extension Motor: Right :  Upper extremity   5/5 Without pronator drift      Left: Upper extremity   5/5 without pronator drift Right:   Lower extremity   5/5                                          Left: Lower extremity   5/5 Tone and bulk:normal tone throughout; no atrophy noted Sensory: Pinprick and light touch intact bilaterally Deep Tendon Reflexes: 2+ and symmetric throughout Plantars: Right: downgoing                              Left: downgoing Cerebellar: Finger-to-nose testing intact bilaterally. Heel to shin testing normal bilaterally Gait: not tested due to safety concerns  Data Reviewed  Laboratory Studies:   Basic Metabolic Panel: Recent Labs  Lab 04/10/18 1419 04/10/18 1909 04/11/18 0614  NA 140 141 139  K 3.8 3.7 4.2  CL 106 108 109  CO2 23 15* 23  GLUCOSE 131* 133* 99  BUN 11 12 10   CREATININE 0.87 0.96 0.70  CALCIUM 9.3 9.6 9.0    Liver Function Tests: No results for input(s): AST, ALT, ALKPHOS, BILITOT, PROT, ALBUMIN in the last 168 hours. No results for input(s): LIPASE, AMYLASE in the last 168 hours. No results for input(s): AMMONIA in the last 168 hours.  CBC: Recent Labs  Lab 04/10/18 1419 04/11/18 0614  WBC 13.7* 12.8*  NEUTROABS 8.2*  --   HGB 14.6 13.6  HCT 45.4 42.4  MCV 90.8 90.2  PLT 233 218    Cardiac  Enzymes: Recent Labs  Lab 04/10/18 1909  TROPONINI <0.03    BNP: Invalid input(s): POCBNP  CBG: Recent Labs  Lab 04/10/18 1903  GLUCAP 124*    Microbiology: No results found for this or any previous visit.  Coagulation Studies: No results for input(s): LABPROT, INR in the last 72  hours.  Urinalysis: No results for input(s): COLORURINE, LABSPEC, PHURINE, GLUCOSEU, HGBUR, BILIRUBINUR, KETONESUR, PROTEINUR, UROBILINOGEN, NITRITE, LEUKOCYTESUR in the last 168 hours.  Invalid input(s): APPERANCEUR  Lipid Panel:     Component Value Date/Time   CHOL 158 05/04/2015 0240   TRIG 47 05/04/2015 0240   HDL 51 05/04/2015 0240   CHOLHDL 3.1 05/04/2015 0240   VLDL 9 05/04/2015 0240   LDLCALC 98 05/04/2015 0240    HgbA1C: No results found for: HGBA1C  Urine Drug Screen:      Component Value Date/Time   LABOPIA NONE DETECTED 04/12/2015 1949   COCAINSCRNUR NONE DETECTED 04/12/2015 1949   LABBENZ NONE DETECTED 04/12/2015 1949   AMPHETMU NONE DETECTED 04/12/2015 1949   THCU NONE DETECTED 04/12/2015 1949   LABBARB POSITIVE (A) 04/12/2015 1949    Alcohol Level: No results for input(s): ETH in the last 168 hours.  Other results: EKG: normal EKG, normal sinus rhythm, unchanged from previous tracings. Vent. rate 96 BPM PR interval * ms QRS duration 91 ms QT/QTc 339/429 ms P-R-T axes 77 58 42  Imaging: Ct Head Wo Contrast  Result Date: 04/10/2018 CLINICAL DATA:  Seizure with witnessed fall. EXAM: CT HEAD WITHOUT CONTRAST TECHNIQUE: Contiguous axial images were obtained from the base of the skull through the vertex without intravenous contrast. COMPARISON:  04/12/2015 FINDINGS: Brain: No evidence of acute infarction, hemorrhage, hydrocephalus, extra-axial collection or mass lesion/mass effect. Focal 7 mm low-attenuation over the right side of the pons likely chronic ischemic change. Vascular: No hyperdense vessel or unexpected calcification. Skull: Normal. Negative for fracture or  focal lesion. Sinuses/Orbits: Orbits are normal. Opacification over the superior aspect of the right maxillary sinus. Other: None. IMPRESSION: No acute findings. Focal low-attenuation over the right side of the pons likely chronic ischemic change. Chronic inflammatory change of the right maxillary sinus. Electronically Signed   By: Elberta Fortis M.D.   On: 04/10/2018 15:04   Ct Maxillofacial Wo Contrast  Result Date: 04/10/2018 CLINICAL DATA:  Witnessed fall today/seizure. EXAM: CT MAXILLOFACIAL WITHOUT CONTRAST TECHNIQUE: Multidetector CT imaging of the maxillofacial structures was performed. Multiplanar CT image reconstructions were also generated. COMPARISON:  CT head/cervical spine 04/12/2015. FINDINGS: Osseous: Possible subtle nondisplaced fracture versus chronic changes involving the lateral wall the right maxillary sinus. Old nasal bone fracture unchanged from 2017. Orbits: Normal and symmetric. Sinuses: Mild mucosal membrane thickening over the left frontoethmoidal recess and minimal opacification over the ethmoid air cells. Complete opacification of the right maxillary sinus and ostiomeatal complex. Minimal mucosal membrane thickening over the floor the left maxillary sinus. Mastoid air cells are clear. Soft tissues: Unremarkable. Limited intracranial: Unremarkable. IMPRESSION: Possible subtle nondisplaced fracture along the lateral wall of the right maxillary sinus although these findings may be chronic. Old nasal bone fracture. Chronic sinus inflammatory change with complete opacification of the right maxillary sinus. Electronically Signed   By: Elberta Fortis M.D.   On: 04/10/2018 18:20   Patient seen and examined.  Clinical course and management discussed.  Necessary edits performed.  I agree with the above.  Assessment and plan of care developed and discussed below.    Assessment: 53 year old male with past medical history of seizure disorder since age 21, intractable chronic migraine without  aura, sleep apnea, hyperlipidemia, depression, heart block, and tobacco abuse resenting to the ED on 04/10/2018 with witnessed seizures and subsequent fall. Unclear cause of breakthrough seizures. Patient odes report though that he was on Lamictal twice a day but his seizures were doing better  and he went to once a day.  CT head reviewed and shows no acute intracranial abnormality.  Patient reports he is on phenobarbital and Lamictal as prescribed and has not missed any doses.  Labs unremarkable.  Unable to obtain MRI of the brain due to pacemaker  Plan: 1. Seizure precautions 3. Ativan prn seizure activity 4. Check UDS 5. Phenobarbital levels pending 6. Continue phenobarbital  194.4 mg once daily.  Increase Lamictal 50 mg twice daily.  Will not continue Keppra.  Would discontinue in AM. 7.  Recommend follow-up with outpatient neurology 8. Patient unable to drive, operate heavy machinery, perform activities at heights and participate in water activities until release by outpatient physician.  This patient was staffed with Dr. Verlon AuLeslie, Thad Rangereynolds who personally evaluated patient, reviewed documentation and agreed with assessment and plan of care as above.  Seth SilversmithElizabeth Ouma, DNP, FNP-BC Board certified Nurse Practitioner Neurology Department  04/11/2018, 9:41 AM   Thana FarrLeslie Finnley [place], MD Neurology [phone]  04/11/2018  1:23 PM

## 2018-04-11 NOTE — Plan of Care (Signed)
  Problem: Spiritual Needs Goal: Ability to function at adequate level Outcome: Progressing   Problem: Education: Goal: Knowledge of General Education information will improve Description Including pain rating scale, medication(s)/side effects and non-pharmacologic comfort measures Outcome: Progressing   Problem: Nutrition: Goal: Adequate nutrition will be maintained Outcome: Progressing

## 2018-04-11 NOTE — Progress Notes (Signed)
   04/11/18 1500  Clinical Encounter Type  Visited With Patient and family together  Visit Type Initial  Referral From Nurse  Spiritual Encounters  Spiritual Needs Prayer;Emotional  Ch responded to an OR requesting an AD education. Pt was with a friend and a friend's mother. Pt was going in and out of sleep. Ch talked to the friend and the friend's mother as they were shocked from what happened to the pt. Pt has had seizures since he was 11. Ch prayed with them and educated them on the AD. If pt were to proceed with it, pt will reach out to the ch.

## 2018-04-12 LAB — HIV ANTIBODY (ROUTINE TESTING W REFLEX): HIV SCREEN 4TH GENERATION: NONREACTIVE

## 2018-04-12 MED ORDER — LAMOTRIGINE 25 MG PO CHEW: 50.0000 mg | CHEWABLE_TABLET | Freq: Two times a day (BID) | ORAL | 0 refills | Status: DC
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
## 2018-04-12 NOTE — Progress Notes (Signed)
Pt placed on overnight pulse ox per MD order

## 2018-04-12 NOTE — Progress Notes (Signed)
Subjective: Patient with no seizures overnight.  Stable on current medications.    Objective: Current vital signs: BP 127/60 (BP Location: Right Arm)   Pulse (!) 59   Temp 98 F (36.7 C) (Oral)   Resp 19   Ht 5\' 10"  (1.778 m)   Wt 112.5 kg   SpO2 96%   BMI 35.58 kg/m  Vital signs in last 24 hours: Temp:  [98 F (36.7 C)-98.5 F (36.9 C)] 98 F (36.7 C) (02/22 0812) Pulse Rate:  [59-64] 59 (02/22 0812) Resp:  [18-20] 19 (02/22 0812) BP: (104-138)/(60-76) 127/60 (02/22 0812) SpO2:  [96 %-98 %] 96 % (02/22 0812)  Intake/Output from previous day: 02/21 0701 - 02/22 0700 In: 476.8 [P.O.:390; IV Piggyback:86.8] Out: 2250 [Urine:2250] Intake/Output this shift: No intake/output data recorded. Nutritional status:  Diet Order            Diet Heart Room service appropriate? Yes; Fluid consistency: Thin  Diet effective now              Neurologic Exam: Mental Status: Alert, oriented, thought content appropriate.  Speech fluent without evidence of aphasia.  Able to follow 3 step commands without difficulty. Cranial Nerves: II: Discs flat bilaterally; Visual fields grossly normal, pupils equal, round, reactive to light and accommodation III,IV, VI: ptosis not present, extra-ocular motions intact bilaterally V,VII: smile symmetric, facial light touch sensation normal bilaterally VIII: hearing normal bilaterally IX,X: gag reflex present XI: bilateral shoulder shrug XII: midline tongue extension Motor: Right : Upper extremity   5/5    Left:     Upper extremity   5/5  Lower extremity   5/5     Lower extremity   5/5 Tone and bulk:normal tone throughout; no atrophy noted Sensory: Pinprick and light touch intact throughout, bilaterally  Lab Results: Basic Metabolic Panel: Recent Labs  Lab 04/10/18 1419 04/10/18 1909 04/11/18 0614  NA 140 141 139  K 3.8 3.7 4.2  CL 106 108 109  CO2 23 15* 23  GLUCOSE 131* 133* 99  BUN 11 12 10   CREATININE 0.87 0.96 0.70  CALCIUM 9.3  9.6 9.0    Liver Function Tests: No results for input(s): AST, ALT, ALKPHOS, BILITOT, PROT, ALBUMIN in the last 168 hours. No results for input(s): LIPASE, AMYLASE in the last 168 hours. No results for input(s): AMMONIA in the last 168 hours.  CBC: Recent Labs  Lab 04/10/18 1419 04/11/18 0614  WBC 13.7* 12.8*  NEUTROABS 8.2*  --   HGB 14.6 13.6  HCT 45.4 42.4  MCV 90.8 90.2  PLT 233 218    Cardiac Enzymes: Recent Labs  Lab 04/10/18 1909  TROPONINI <0.03    Lipid Panel: No results for input(s): CHOL, TRIG, HDL, CHOLHDL, VLDL, LDLCALC in the last 168 hours.  CBG: Recent Labs  Lab 04/10/18 1903  GLUCAP 124*    Microbiology: No results found for this or any previous visit.  Coagulation Studies: No results for input(s): LABPROT, INR in the last 72 hours.  Imaging: Ct Head Wo Contrast  Result Date: 04/10/2018 CLINICAL DATA:  Seizure with witnessed fall. EXAM: CT HEAD WITHOUT CONTRAST TECHNIQUE: Contiguous axial images were obtained from the base of the skull through the vertex without intravenous contrast. COMPARISON:  04/12/2015 FINDINGS: Brain: No evidence of acute infarction, hemorrhage, hydrocephalus, extra-axial collection or mass lesion/mass effect. Focal 7 mm low-attenuation over the right side of the pons likely chronic ischemic change. Vascular: No hyperdense vessel or unexpected calcification. Skull: Normal. Negative for fracture or  focal lesion. Sinuses/Orbits: Orbits are normal. Opacification over the superior aspect of the right maxillary sinus. Other: None. IMPRESSION: No acute findings. Focal low-attenuation over the right side of the pons likely chronic ischemic change. Chronic inflammatory change of the right maxillary sinus. Electronically Signed   By: Elberta Fortis M.D.   On: 04/10/2018 15:04   Ct Maxillofacial Wo Contrast  Result Date: 04/10/2018 CLINICAL DATA:  Witnessed fall today/seizure. EXAM: CT MAXILLOFACIAL WITHOUT CONTRAST TECHNIQUE:  Multidetector CT imaging of the maxillofacial structures was performed. Multiplanar CT image reconstructions were also generated. COMPARISON:  CT head/cervical spine 04/12/2015. FINDINGS: Osseous: Possible subtle nondisplaced fracture versus chronic changes involving the lateral wall the right maxillary sinus. Old nasal bone fracture unchanged from 2017. Orbits: Normal and symmetric. Sinuses: Mild mucosal membrane thickening over the left frontoethmoidal recess and minimal opacification over the ethmoid air cells. Complete opacification of the right maxillary sinus and ostiomeatal complex. Minimal mucosal membrane thickening over the floor the left maxillary sinus. Mastoid air cells are clear. Soft tissues: Unremarkable. Limited intracranial: Unremarkable. IMPRESSION: Possible subtle nondisplaced fracture along the lateral wall of the right maxillary sinus although these findings may be chronic. Old nasal bone fracture. Chronic sinus inflammatory change with complete opacification of the right maxillary sinus. Electronically Signed   By: Elberta Fortis M.D.   On: 04/10/2018 18:20    Medications:  I have reviewed the patient's current medications. Scheduled: . aspirin EC  81 mg Oral Daily  . atorvastatin  40 mg Oral Daily  . carvedilol  3.125 mg Oral BID  . cholecalciferol  1,000 Units Oral Daily  . clindamycin  300 mg Oral TID  . enoxaparin (LOVENOX) injection  40 mg Subcutaneous Q24H  . FLUoxetine  40 mg Oral Daily  . furosemide  60 mg Oral BID  . lamoTRIgine  50 mg Oral BID  . levETIRAcetam  500 mg Oral BID  . mometasone-formoterol  2 puff Inhalation BID  . pantoprazole  40 mg Oral Daily  . PHENobarbital  194.4 mg Oral QHS  . potassium chloride SA  20 mEq Oral Daily  . sodium chloride flush  3 mL Intravenous Q12H    Assessment/Plan: Patient seizure free since admission.  Now back on Lamictal.  Keppra to be discontinued.  Patient to remain on Lamictal and Phenobarbital.     Recommendations: 1.  Continue Lamictal at 50mg  BID 2.  Continue Phenobarbital at home dose 3.  D/C Keppra 4.  Seizure precautions 5.  Patient unable to drive, operate heavy machinery, perform activities at heights and participate in water activities until release by outpatient physician.   LOS: 2 days   Thana Farr, MD Neurology [phone] 04/12/2018  9:37 AM

## 2018-04-12 NOTE — Progress Notes (Signed)
Patient given discharge instructions with brother at bedside. IV taken out and tele monitor off. Patient verbalized understanding with no questions or concerns. Patient going home via family vehicle.

## 2018-04-12 NOTE — Progress Notes (Signed)
Advanced care plan. Purpose of the Encounter: CODE STATUS Parties in Attendance: Patient Patient's Decision Capacity: Good Subjective/Patient's story: Presented to emergency room for seizure Objective/Medical story Needs evaluation for breakthrough seizure Adjustment of meds and neurology consult Goals of care determination:  Advance care directives goals of care and treatment plan discussed Patient wants everything done which includes CPR, intubation ventilator the need arises CODE STATUS: Full code Time spent discussing advanced care planning: 16 minutes

## 2018-04-12 NOTE — Plan of Care (Signed)
  Problem: Spiritual Needs Goal: Ability to function at adequate level Outcome: Adequate for Discharge   Problem: Education: Goal: Knowledge of General Education information will improve Description Including pain rating scale, medication(s)/side effects and non-pharmacologic comfort measures Outcome: Adequate for Discharge   Problem: Health Behavior/Discharge Planning: Goal: Ability to manage health-related needs will improve Outcome: Adequate for Discharge   Problem: Clinical Measurements: Goal: Ability to maintain clinical measurements within normal limits will improve Outcome: Adequate for Discharge Goal: Will remain free from infection Outcome: Adequate for Discharge Goal: Diagnostic test results will improve Outcome: Adequate for Discharge Goal: Respiratory complications will improve Outcome: Adequate for Discharge Goal: Cardiovascular complication will be avoided Outcome: Adequate for Discharge   Problem: Activity: Goal: Risk for activity intolerance will decrease Outcome: Adequate for Discharge   Problem: Nutrition: Goal: Adequate nutrition will be maintained Outcome: Adequate for Discharge   Problem: Coping: Goal: Level of anxiety will decrease Outcome: Adequate for Discharge   Problem: Elimination: Goal: Will not experience complications related to bowel motility Outcome: Adequate for Discharge Goal: Will not experience complications related to urinary retention Outcome: Adequate for Discharge   Problem: Pain Managment: Goal: General experience of comfort will improve Outcome: Adequate for Discharge   Problem: Safety: Goal: Ability to remain free from injury will improve Outcome: Adequate for Discharge   Problem: Skin Integrity: Goal: Risk for impaired skin integrity will decrease Outcome: Adequate for Discharge   

## 2018-04-14 LAB — LAMOTRIGINE LEVEL: Lamotrigine Lvl: NOT DETECTED ug/mL (ref 2.0–20.0)

## 2018-06-25 ENCOUNTER — Other Ambulatory Visit: Payer: Self-pay

## 2018-06-25 ENCOUNTER — Other Ambulatory Visit: Payer: Self-pay | Admitting: Family Medicine

## 2018-06-25 ENCOUNTER — Ambulatory Visit
Admission: RE | Admit: 2018-06-25 | Discharge: 2018-06-25 | Disposition: A | Discharge status: Home / Self Care | Payer: Medicaid Other | Attending: Family Medicine | Admitting: Family Medicine

## 2018-06-25 ENCOUNTER — Ambulatory Visit
Admission: RE | Admit: 2018-06-25 | Discharge: 2018-06-25 | Disposition: A | Discharge status: Home / Self Care | Payer: Medicaid Other | Source: Ambulatory Visit | Attending: Family Medicine | Admitting: Family Medicine

## 2018-06-25 DIAGNOSIS — M25562 Pain in left knee: Secondary | ICD-10-CM

## 2018-06-25 DIAGNOSIS — M25561 Pain in right knee: Secondary | ICD-10-CM

## 2019-01-14 ENCOUNTER — Other Ambulatory Visit: Payer: Self-pay

## 2019-01-14 DIAGNOSIS — Z20822 Contact with and (suspected) exposure to covid-19: Secondary | ICD-10-CM

## 2019-01-15 LAB — NOVEL CORONAVIRUS, NAA: SARS-CoV-2, NAA: NOT DETECTED

## 2019-01-17 ENCOUNTER — Emergency Department (HOSPITAL_COMMUNITY): Payer: Medicaid Other

## 2019-01-17 ENCOUNTER — Emergency Department (HOSPITAL_COMMUNITY)
Admission: EM | Admit: 2019-01-17 | Discharge: 2019-01-17 | Disposition: A | Discharge status: Home / Self Care | Payer: Medicaid Other | Attending: Emergency Medicine | Admitting: Emergency Medicine

## 2019-01-17 ENCOUNTER — Encounter (HOSPITAL_COMMUNITY): Payer: Self-pay

## 2019-01-17 ENCOUNTER — Other Ambulatory Visit: Payer: Self-pay

## 2019-01-17 DIAGNOSIS — S161XXA Strain of muscle, fascia and tendon at neck level, initial encounter: Secondary | ICD-10-CM | POA: Diagnosis not present

## 2019-01-17 DIAGNOSIS — Y92410 Unspecified street and highway as the place of occurrence of the external cause: Secondary | ICD-10-CM | POA: Diagnosis not present

## 2019-01-17 DIAGNOSIS — Z91010 Allergy to peanuts: Secondary | ICD-10-CM | POA: Insufficient documentation

## 2019-01-17 DIAGNOSIS — F17210 Nicotine dependence, cigarettes, uncomplicated: Secondary | ICD-10-CM | POA: Diagnosis not present

## 2019-01-17 DIAGNOSIS — M79602 Pain in left arm: Secondary | ICD-10-CM | POA: Diagnosis not present

## 2019-01-17 DIAGNOSIS — Z950 Presence of cardiac pacemaker: Secondary | ICD-10-CM | POA: Insufficient documentation

## 2019-01-17 DIAGNOSIS — S069X1A Unspecified intracranial injury with loss of consciousness of 30 minutes or less, initial encounter: Secondary | ICD-10-CM | POA: Insufficient documentation

## 2019-01-17 DIAGNOSIS — I5032 Chronic diastolic (congestive) heart failure: Secondary | ICD-10-CM | POA: Diagnosis not present

## 2019-01-17 DIAGNOSIS — Y939 Activity, unspecified: Secondary | ICD-10-CM | POA: Diagnosis not present

## 2019-01-17 DIAGNOSIS — Z79899 Other long term (current) drug therapy: Secondary | ICD-10-CM | POA: Diagnosis not present

## 2019-01-17 DIAGNOSIS — I110 Hypertensive heart disease with heart failure: Secondary | ICD-10-CM | POA: Diagnosis not present

## 2019-01-17 DIAGNOSIS — I2510 Atherosclerotic heart disease of native coronary artery without angina pectoris: Secondary | ICD-10-CM | POA: Insufficient documentation

## 2019-01-17 DIAGNOSIS — Y999 Unspecified external cause status: Secondary | ICD-10-CM | POA: Insufficient documentation

## 2019-01-17 DIAGNOSIS — Z7982 Long term (current) use of aspirin: Secondary | ICD-10-CM | POA: Diagnosis not present

## 2019-01-17 DIAGNOSIS — S301XXA Contusion of abdominal wall, initial encounter: Secondary | ICD-10-CM | POA: Insufficient documentation

## 2019-01-17 DIAGNOSIS — S199XXA Unspecified injury of neck, initial encounter: Secondary | ICD-10-CM | POA: Diagnosis present

## 2019-01-17 LAB — BASIC METABOLIC PANEL
Anion gap: 8 (ref 5–15)
BUN: 16 mg/dL (ref 6–20)
CO2: 25 mmol/L (ref 22–32)
Calcium: 9.4 mg/dL (ref 8.9–10.3)
Chloride: 105 mmol/L (ref 98–111)
Creatinine, Ser: 0.7 mg/dL (ref 0.61–1.24)
GFR calc Af Amer: >60 mL/min (ref >60)
GFR calc non Af Amer: >60 mL/min (ref >60)
Glucose, Bld: 103 mg/dL — ABNORMAL HIGH (ref 70–99)
Potassium: 4.2 mmol/L (ref 3.5–5.1)
Sodium: 138 mmol/L (ref 135–145)

## 2019-01-17 LAB — CBC WITH DIFFERENTIAL/PLATELET
Abs Immature Granulocytes: 0.03 10*3/uL (ref 0.00–0.07)
Basophils Absolute: 0 10*3/uL (ref 0.0–0.1)
Basophils Relative: 0 %
Eosinophils Absolute: 0.2 10*3/uL (ref 0.0–0.5)
Eosinophils Relative: 2 %
HCT: 46.8 % (ref 39.0–52.0)
Hemoglobin: 14.8 g/dL (ref 13.0–17.0)
Immature Granulocytes: 0 %
Lymphocytes Relative: 31 %
Lymphs Abs: 3.4 10*3/uL (ref 0.7–4.0)
MCH: 29 pg (ref 26.0–34.0)
MCHC: 31.6 g/dL (ref 30.0–36.0)
MCV: 91.8 fL (ref 80.0–100.0)
Monocytes Absolute: 0.8 10*3/uL (ref 0.1–1.0)
Monocytes Relative: 8 %
Neutro Abs: 6.5 10*3/uL (ref 1.7–7.7)
Neutrophils Relative %: 59 %
Platelets: 286 10*3/uL (ref 150–400)
RBC: 5.1 MIL/uL (ref 4.22–5.81)
RDW: 14.1 % (ref 11.5–15.5)
WBC: 11 10*3/uL — ABNORMAL HIGH (ref 4.0–10.5)
nRBC: 0 % (ref 0.0–0.2)

## 2019-01-17 LAB — ABO/RH: ABO/RH(D): AB POS

## 2019-01-17 LAB — TYPE AND SCREEN
ABO/RH(D): AB POS
Antibody Screen: NEGATIVE

## 2019-01-17 MED ORDER — SODIUM CHLORIDE (PF) 0.9 % IJ SOLN
INTRAMUSCULAR | Status: AC
  Administered 2019-01-17: 16:00:00
  Filled 2019-01-17: qty 50

## 2019-01-17 MED ORDER — TIZANIDINE HCL 4 MG PO TABS: 4.0000 mg | ORAL_TABLET | Freq: Four times a day (QID) | ORAL | 0 refills | Status: DC | PRN

## 2019-01-17 MED ORDER — MORPHINE SULFATE (PF) 4 MG/ML IV SOLN
6.0000 mg | Freq: Once | INTRAVENOUS | Status: AC
  Administered 2019-01-17: 6 mg via INTRAVENOUS
  Filled 2019-01-17: qty 2

## 2019-01-17 MED ORDER — IOHEXOL 300 MG/ML  SOLN
100.0000 mL | Freq: Once | INTRAMUSCULAR | Status: AC | PRN
  Administered 2019-01-17: 16:00:00 100 mL via INTRAVENOUS

## 2019-01-17 MED ORDER — HYDROCODONE-ACETAMINOPHEN 5-325 MG PO TABS: 2.0000 | ORAL_TABLET | ORAL | 0 refills | Status: DC | PRN

## 2019-01-17 MED ORDER — METHOCARBAMOL 500 MG PO TABS: 500.0000 mg | ORAL_TABLET | Freq: Once | ORAL | Status: DC

## 2019-01-17 MED ORDER — SODIUM CHLORIDE 0.9 % IV SOLN
INTRAVENOUS | Status: DC
  Administered 2019-01-17: 14:00:00 via INTRAVENOUS

## 2019-01-17 MED ORDER — HYDROCODONE-ACETAMINOPHEN 5-325 MG PO TABS
1.0000 | ORAL_TABLET | Freq: Once | ORAL | Status: AC
  Administered 2019-01-17: 1 via ORAL
  Filled 2019-01-17: qty 1

## 2019-01-17 NOTE — ED Notes (Signed)
Patient transported to X-ray 
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
## 2019-01-17 NOTE — ED Notes (Signed)
Patient transported to CT 
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
## 2019-01-17 NOTE — Discharge Instructions (Signed)
You can wear the cervical collar for the next 1 to 2 weeks while you are up and moving around.  You can remove it to shower.  If you start developing any numbness or weakness in your arms or legs you need to return to the emergency room immediately.  For the next 2 to 3 days you are going to be extremely sore so do hot showers, muscle rubs and take the pain medicine and muscle relaxer as needed.

## 2019-01-30 ENCOUNTER — Other Ambulatory Visit: Payer: Self-pay | Admitting: Neurological Surgery

## 2019-01-30 ENCOUNTER — Telehealth: Payer: Self-pay

## 2019-01-30 DIAGNOSIS — M542 Cervicalgia: Secondary | ICD-10-CM

## 2019-01-30 NOTE — Telephone Encounter (Signed)
Phone call to patient to verify medication list and allergies for myelogram procedure. Pt instructed to hold Prozac for 48hrs prior to myelogram appointment time. Pt verbalized understanding. Pt also advised he would be with [place] around 2 hours, to have a driver the day of the procedure, and he would need to lay flat for 24 hours post procedure. Pt verbalized understanding.

## 2019-02-02 ENCOUNTER — Telehealth: Payer: Self-pay | Admitting: *Deleted

## 2019-02-02 NOTE — Telephone Encounter (Signed)
Pt's wife called in and spoke with Douglass Rivers, [place], to ask if his PPM is MRI-conditional. Confirmed based on Paceart data and implant note that pt's device is MRI-conditional. Kem made pt's wife aware. No further questions at this time.

## 2019-02-05 ENCOUNTER — Emergency Department (HOSPITAL_COMMUNITY): Payer: Medicaid Other

## 2019-02-05 ENCOUNTER — Emergency Department (HOSPITAL_COMMUNITY)
Admission: EM | Admit: 2019-02-05 | Discharge: 2019-02-05 | Disposition: A | Discharge status: Home / Self Care | Payer: Medicaid Other | Attending: Emergency Medicine | Admitting: Emergency Medicine

## 2019-02-05 ENCOUNTER — Ambulatory Visit
Admission: RE | Admit: 2019-02-05 | Discharge: 2019-02-05 | Disposition: A | Discharge status: Home / Self Care | Payer: Medicaid Other | Source: Ambulatory Visit | Attending: Neurological Surgery | Admitting: Neurological Surgery

## 2019-02-05 DIAGNOSIS — I2510 Atherosclerotic heart disease of native coronary artery without angina pectoris: Secondary | ICD-10-CM | POA: Diagnosis not present

## 2019-02-05 DIAGNOSIS — I110 Hypertensive heart disease with heart failure: Secondary | ICD-10-CM | POA: Diagnosis not present

## 2019-02-05 DIAGNOSIS — Z79899 Other long term (current) drug therapy: Secondary | ICD-10-CM | POA: Insufficient documentation

## 2019-02-05 DIAGNOSIS — I5032 Chronic diastolic (congestive) heart failure: Secondary | ICD-10-CM | POA: Insufficient documentation

## 2019-02-05 DIAGNOSIS — M542 Cervicalgia: Secondary | ICD-10-CM | POA: Diagnosis not present

## 2019-02-05 DIAGNOSIS — Z7982 Long term (current) use of aspirin: Secondary | ICD-10-CM | POA: Insufficient documentation

## 2019-02-05 DIAGNOSIS — R569 Unspecified convulsions: Secondary | ICD-10-CM

## 2019-02-05 DIAGNOSIS — F17210 Nicotine dependence, cigarettes, uncomplicated: Secondary | ICD-10-CM | POA: Insufficient documentation

## 2019-02-05 LAB — COMPREHENSIVE METABOLIC PANEL
ALT: 22 U/L (ref 0–44)
AST: 21 U/L (ref 15–41)
Albumin: 4.5 g/dL (ref 3.5–5.0)
Alkaline Phosphatase: 79 U/L (ref 38–126)
Anion gap: 20 — ABNORMAL HIGH (ref 5–15)
BUN: 8 mg/dL (ref 6–20)
CO2: 17 mmol/L — ABNORMAL LOW (ref 22–32)
Calcium: 9.6 mg/dL (ref 8.9–10.3)
Chloride: 106 mmol/L (ref 98–111)
Creatinine, Ser: 1.01 mg/dL (ref 0.61–1.24)
GFR calc Af Amer: >60 mL/min (ref >60)
GFR calc non Af Amer: >60 mL/min (ref >60)
Glucose, Bld: 120 mg/dL — ABNORMAL HIGH (ref 70–99)
Potassium: 4.2 mmol/L (ref 3.5–5.1)
Sodium: 143 mmol/L (ref 135–145)
Total Bilirubin: 0.4 mg/dL (ref 0.3–1.2)
Total Protein: 8.2 g/dL — ABNORMAL HIGH (ref 6.5–8.1)

## 2019-02-05 LAB — CBC WITH DIFFERENTIAL/PLATELET
Abs Immature Granulocytes: 0.07 10*3/uL (ref 0.00–0.07)
Basophils Absolute: 0.1 10*3/uL (ref 0.0–0.1)
Basophils Relative: 0 %
Eosinophils Absolute: 0.2 10*3/uL (ref 0.0–0.5)
Eosinophils Relative: 1 %
HCT: 50.2 % (ref 39.0–52.0)
Hemoglobin: 15.7 g/dL (ref 13.0–17.0)
Immature Granulocytes: 0 %
Lymphocytes Relative: 39 %
Lymphs Abs: 6 10*3/uL — ABNORMAL HIGH (ref 0.7–4.0)
MCH: 29.4 pg (ref 26.0–34.0)
MCHC: 31.3 g/dL (ref 30.0–36.0)
MCV: 94 fL (ref 80.0–100.0)
Monocytes Absolute: 1.3 10*3/uL — ABNORMAL HIGH (ref 0.1–1.0)
Monocytes Relative: 9 %
Neutro Abs: 7.9 10*3/uL — ABNORMAL HIGH (ref 1.7–7.7)
Neutrophils Relative %: 51 %
Platelets: 278 10*3/uL (ref 150–400)
RBC: 5.34 MIL/uL (ref 4.22–5.81)
RDW: 14.3 % (ref 11.5–15.5)
WBC: 15.6 10*3/uL — ABNORMAL HIGH (ref 4.0–10.5)
nRBC: 0 % (ref 0.0–0.2)

## 2019-02-05 LAB — CBG MONITORING, ED: Glucose-Capillary: 130 mg/dL — ABNORMAL HIGH (ref 70–99)

## 2019-02-05 LAB — PHENOBARBITAL LEVEL: Phenobarbital: 35.5 ug/mL — ABNORMAL HIGH (ref 15.0–30.0)

## 2019-02-05 MED ORDER — LAMOTRIGINE ER 100 MG PO TB24: 150.0000 mg | ORAL_TABLET | Freq: Every day | ORAL | 0 refills | Status: DC

## 2019-02-05 MED ORDER — DIAZEPAM 5 MG PO TABS
10.0000 mg | ORAL_TABLET | Freq: Once | ORAL | Status: AC
  Administered 2019-02-05: 10 mg via ORAL

## 2019-02-05 MED ORDER — LEVETIRACETAM IN NACL 1000 MG/100ML IV SOLN
1000.0000 mg | Freq: Once | INTRAVENOUS | Status: AC
  Administered 2019-02-05: 18:00:00 1000 mg via INTRAVENOUS
  Filled 2019-02-05: qty 100

## 2019-02-05 MED ORDER — IOPAMIDOL (ISOVUE-M 300) INJECTION 61%
10.0000 mL | Freq: Once | INTRAMUSCULAR | Status: AC
  Administered 2019-02-05: 10 mL via INTRATHECAL

## 2019-02-05 MED ORDER — SODIUM CHLORIDE 0.9 % IV SOLN: INTRAVENOUS | Status: DC
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
## 2019-02-05 NOTE — ED Notes (Signed)
Dawn(friend#[phone]) called/going to work.  Please call, if ready to pickup.

## 2019-02-05 NOTE — ED Notes (Signed)
[employer] [phone]

## 2019-02-05 NOTE — Discharge Instructions (Signed)

## 2019-02-05 NOTE — Progress Notes (Signed)
Patient states he has been off Prozac for at least the past two days. 

## 2019-02-05 NOTE — ED Notes (Signed)
Pt dc'd home w/all belongings, a/o x4, ambulatory on dc, driven home by spouse

## 2019-02-05 NOTE — Discharge Instructions (Signed)
Increase Lamictal to 150 mg daily.  Keep Phenobarbital the same.

## 2019-03-24 ENCOUNTER — Telehealth: Payer: Self-pay | Admitting: Internal Medicine

## 2019-03-24 NOTE — Telephone Encounter (Signed)
Patient's fiance, Dawn, is calling requesting a letter from Dr. Ladona Ridgel stating that the patient's pacemaker is MRI compatible in order for patient to receive neck and knee therapy at [facility]. Dawn states that she would like to come into the office to pick up the letter when it is available. Please call.

## 2019-03-24 NOTE — Telephone Encounter (Signed)
No DPR on file for release of info to [facility].  Attempted to call pt cell number on record.  No answer, left VM advising that pt will need an office visit/ interrogation to re-establish care in order for MD to write letter or sign forms for MRI compatibility.  Provided DC phone to return call.

## 2019-03-25 ENCOUNTER — Other Ambulatory Visit (HOSPITAL_COMMUNITY): Payer: Self-pay | Admitting: Anesthesiology

## 2019-03-25 ENCOUNTER — Other Ambulatory Visit: Payer: Self-pay | Admitting: Anesthesiology

## 2019-03-27 ENCOUNTER — Telehealth: Payer: Self-pay

## 2019-03-27 NOTE — Telephone Encounter (Signed)
I let the Seth Tucker know we have not seen him since 05-09-2017 and we have not received a transmission from his home remote monitor since 2018. I told him if he wants a letter to state he can have a MRI he has to be seen by his physician to get his ppm interrogated. I asked Virgina Evener and she helped me schedule the Seth Tucker with Dr. Ladona Ridgel on 04-03-2019 at 10:45 am. The Seth Tucker agreed to come to this appointment. I told him if he miss the appointment he will not be able to get his letter. The Seth Tucker states he will be at the appointment.

## 2019-04-03 ENCOUNTER — Ambulatory Visit: Payer: Medicaid Other | Admitting: Internal Medicine

## 2019-04-03 ENCOUNTER — Encounter: Payer: Self-pay | Admitting: Internal Medicine

## 2019-04-03 ENCOUNTER — Other Ambulatory Visit: Payer: Self-pay

## 2019-04-03 VITALS — BP 154/88 | HR 64 | Ht 70.0 in | Wt 268.2 lb

## 2019-04-03 DIAGNOSIS — I495 Sick sinus syndrome: Secondary | ICD-10-CM

## 2019-04-03 DIAGNOSIS — Z950 Presence of cardiac pacemaker: Secondary | ICD-10-CM | POA: Diagnosis not present

## 2019-04-03 LAB — CUP PACEART INCLINIC DEVICE CHECK
Brady Statistic RA Percent Paced: 1 %
Brady Statistic RV Percent Paced: 1 %
Date Time Interrogation Session: 20210212171553
Implantable Lead Implant Date: 20170315
Implantable Lead Implant Date: 20170315
Implantable Lead Location: 753859
Implantable Lead Location: 753860
Implantable Lead Model: 7741
Implantable Lead Model: 7742
Implantable Lead Serial Number: 707950
Implantable Lead Serial Number: 749257
Implantable Pulse Generator Implant Date: 20170315
Lead Channel Impedance Value: 576 Ohm
Lead Channel Impedance Value: 697 Ohm
Lead Channel Pacing Threshold Amplitude: 0.7 V
Lead Channel Pacing Threshold Amplitude: 1 V
Lead Channel Pacing Threshold Pulse Width: 0.4 ms
Lead Channel Pacing Threshold Pulse Width: 0.4 ms
Lead Channel Sensing Intrinsic Amplitude: 25 mV
Lead Channel Sensing Intrinsic Amplitude: 8.1 mV
Lead Channel Setting Pacing Amplitude: 2 V
Lead Channel Setting Pacing Amplitude: 2.4 V
Lead Channel Setting Pacing Pulse Width: 0.4 ms
Lead Channel Setting Sensing Sensitivity: 0.6 mV
Pulse Gen Serial Number: 718057

## 2019-04-03 NOTE — Patient Instructions (Signed)
Medication Instructions:  Your physician recommends that you continue on your current medications as directed. Please refer to the Current Medication list given to you today.  Labwork: None ordered.  Testing/Procedures: None ordered.  Follow-Up: Your physician wants you to follow-up in: one year with Dr. Ladona Ridgel.   You will receive a reminder letter in the mail two months in advance. If you don't receive a letter, please call our office to schedule the follow-up appointment.  Remote monitoring is used to monitor your Pacemaker from home. This monitoring reduces the number of office visits required to check your device to one time per year. It allows [place] to keep an eye on the functioning of your device to ensure it is working properly. You are scheduled for a device check from home on Jul 06, 2019. You may send your transmission at any time that day. If you have a wireless device, the transmission will be sent automatically.   Any Other Special Instructions Will Be Listed Below (If Applicable).  If you need a refill on your cardiac medications before your next appointment, please call your pharmacy.

## 2019-04-03 NOTE — Progress Notes (Signed)
HPI Seth Tucker returns today after a long absence from the EP clinic. He is a pleasant 54 yo man with a h/o syncope and long pauses on cardiac monitori due to sinus node dysfunction s/p PPM insertion. The patient denies chest pain or sob. He is pending MRI scan. He has developed seizures and is pending MRI. 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   Allergies  Allergen Reactions  . Bee Venom Anaphylaxis and Swelling  . Hornet Venom Shortness Of Breath and Swelling  . Influenza Vaccines Other (See Comments)    Seizures   . Peanuts [Peanut Oil] Anaphylaxis  . Phenytoin Hives and Rash  . Valproic Acid Hives and Rash  . Lamotrigine Rash     Current Outpatient Medications  Medication Sig Dispense Refill  . albuterol (PROVENTIL HFA;VENTOLIN HFA) 108 (90 Base) MCG/ACT inhaler Inhale 2 puffs into the lungs every 4 (four) hours as needed for wheezing or shortness of breath.    Marland Kitchen aspirin EC 81 MG EC tablet Take 1 tablet (81 mg total) by mouth daily.    Marland Kitchen atorvastatin (LIPITOR) 40 MG tablet Take 40 mg by mouth daily.     . cholecalciferol (VITAMIN D) 1000 units tablet Take 1,000 Units by mouth daily.    Marland Kitchen FLUoxetine (PROZAC) 40 MG capsule Take 40 mg by mouth daily.    . Fluticasone-Salmeterol (ADVAIR DISKUS) 250-50 MCG/DOSE AEPB Inhale 1 puff into the lungs 2 (two) times daily.    . furosemide (LASIX) 40 MG tablet Take 1.5 tablets (60 mg total) by mouth 2 (two) times daily. 270 tablet 3  . HYDROcodone-acetaminophen (NORCO/VICODIN) 5-325 MG tablet Take 2 tablets by mouth every 4 (four) hours as needed. 10 tablet 0  . ibuprofen (ADVIL,MOTRIN) 800 MG tablet Take 800 mg by mouth every 8 (eight) hours as needed (pain).    . LamoTRIgine 50 MG TB24 24 hour tablet Take 4 tablets by mouth daily.    . nitroGLYCERIN (NITROSTAT) 0.4 MG SL tablet Place 1 tablet (0.4 mg total) under the tongue every 5 (five) minutes as needed for chest pain. 25 tablet 3  . omeprazole (PRILOSEC) 20 MG capsule Take 40 mg by mouth  daily as needed (for heartburn). Reported on 05/03/2015    . PHENObarbital (LUMINAL) 100 MG tablet Take 200 mg by mouth at bedtime.     . potassium chloride SA (K-DUR,KLOR-CON) 20 MEQ tablet Take 1 tablet (20 mEq) by mouth once a day. 90 tablet 3  . tiZANidine (ZANAFLEX) 4 MG tablet Take 1 tablet (4 mg total) by mouth every 6 (six) hours as needed for muscle spasms. 20 tablet 0   No current facility-administered medications for this visit.     Past Medical History:  Diagnosis Date  . Depression   . Enlarged heart   . Hyperlipidemia   . Seizure (HCC)   . Sleep apnea   . Vitamin D deficiency     ROS:   All systems reviewed and negative except as noted in the HPI.   Past Surgical History:  Procedure Laterality Date  . CARDIAC CATHETERIZATION N/A 05/04/2015   Procedure: Left Heart Cath and Coronary Angiography;  Surgeon: Iran Ouch, MD;  Location: [facility] INVASIVE CV LAB;  Service: Cardiovascular;  Laterality: N/A;  . EP IMPLANTABLE DEVICE N/A 05/04/2015   Procedure: Pacemaker Implant;  Surgeon: Marinus Maw, MD;  Location: [facility] INVASIVE CV LAB;  Service: Cardiovascular;  Laterality: N/A;     Family History  Problem Relation Age  of Onset  . Hypertension Mother   . Emphysema Mother   . Diabetes Other        sibling  . Heart disease Other        sibling  . Hypertension Brother      Social History   Socioeconomic History  . Marital status: Single    Spouse name: Not on file  . Number of children: Not on file  . Years of education: Not on file  . Highest education level: Not on file  Occupational History  . Not on file  Tobacco Use  . Smoking status: Current Every Day Smoker    Packs/day: 0.50    Years: 20.00    Pack years: 10.00    Types: Cigarettes  . Smokeless tobacco: Never Used  . Tobacco comment: 6 a day.  Substance and Sexual Activity  . Alcohol use: No  . Drug use: No  . Sexual activity: Not Currently  Other Topics Concern  . Not on file  Social  History Narrative  . Not on file   Social Determinants of Health   Financial Resource Strain:   . Difficulty of Paying Living Expenses: Not on file  Food Insecurity:   . Worried About Charity fundraiser in the Last Year: Not on file  . Ran Out of Food in the Last Year: Not on file  Transportation Needs:   . Lack of Transportation (Medical): Not on file  . Lack of Transportation (Non-Medical): Not on file  Physical Activity:   . Days of Exercise per Week: Not on file  . Minutes of Exercise per Session: Not on file  Stress:   . Feeling of Stress : Not on file  Social Connections:   . Frequency of Communication with Friends and Family: Not on file  . Frequency of Social Gatherings with Friends and Family: Not on file  . Attends Religious Services: Not on file  . Active Member of Clubs or Organizations: Not on file  . Attends Archivist Meetings: Not on file  . Marital Status: Not on file  Intimate Partner Violence:   . Fear of Current or Ex-Partner: Not on file  . Emotionally Abused: Not on file  . Physically Abused: Not on file  . Sexually Abused: Not on file     BP (!) 154/88   Pulse 64   Ht 5\' 10"  (1.778 m)   Wt 268 lb 3.2 oz (121.7 kg)   SpO2 97%   BMI 38.48 kg/m   Physical Exam:  Well appearing NAD HEENT: Unremarkable Neck:  No JVD, no thyromegally Lymphatics:  No adenopathy Back:  No CVA tenderness Lungs:  Clear with no wheezes HEART:  Regular rate rhythm, no murmurs, no rubs, no clicks Abd:  soft, positive bowel sounds, no organomegally, no rebound, no guarding Ext:  2 plus pulses, no edema, no cyanosis, no clubbing Skin:  No rashes no nodules Neuro:  CN II through XII intact, motor grossly intact  EKG - nsr  DEVICE  Normal device function.  See PaceArt for details.   Assess/Plan: 1. Sinus node dysfunction - he is asymptomaitc s/p PPM insertion. 2. PPM - his [employer] DDD PM is working normally.  3. Seizures - he is pending MRI. His PPM  is MR compatible. We will follow.  4. Diastolic heart failure - he has class 2 symptoms and is encouraged to avoid salty foods.  Seth Tucker.D.

## 2019-04-09 ENCOUNTER — Encounter: Payer: Self-pay | Admitting: *Deleted

## 2019-04-10 ENCOUNTER — Ambulatory Visit (INDEPENDENT_AMBULATORY_CARE_PROVIDER_SITE_OTHER): Payer: Medicaid Other | Admitting: *Deleted

## 2019-04-10 ENCOUNTER — Telehealth: Payer: Self-pay | Admitting: *Deleted

## 2019-04-10 DIAGNOSIS — I495 Sick sinus syndrome: Secondary | ICD-10-CM

## 2019-04-10 LAB — CUP PACEART REMOTE DEVICE CHECK
Battery Remaining Longevity: 138 mo
Battery Remaining Percentage: 100 %
Brady Statistic RA Percent Paced: 0 %
Brady Statistic RV Percent Paced: 0 %
Date Time Interrogation Session: 20210219132600
Implantable Lead Implant Date: 20170315
Implantable Lead Implant Date: 20170315
Implantable Lead Location: 753859
Implantable Lead Location: 753860
Implantable Lead Model: 7741
Implantable Lead Model: 7742
Implantable Lead Serial Number: 707950
Implantable Lead Serial Number: 749257
Implantable Pulse Generator Implant Date: 20170315
Lead Channel Impedance Value: 567 Ohm
Lead Channel Impedance Value: 697 Ohm
Lead Channel Pacing Threshold Amplitude: 0.6 V
Lead Channel Pacing Threshold Amplitude: 1 V
Lead Channel Pacing Threshold Pulse Width: 0.4 ms
Lead Channel Pacing Threshold Pulse Width: 0.4 ms
Lead Channel Setting Pacing Amplitude: 2 V
Lead Channel Setting Pacing Amplitude: 2.4 V
Lead Channel Setting Pacing Pulse Width: 0.4 ms
Lead Channel Setting Sensing Sensitivity: 0.6 mV
Pulse Gen Serial Number: 718057

## 2019-04-10 NOTE — Telephone Encounter (Signed)
Spoke with patient, attempted to assist with Latitude monitor transmission. Transmission not successful, [employer] Patient Services phone number provided. Pt agrees to call for assistance with monitor. Will check back next week. No additional questions at this time.

## 2019-04-10 NOTE — Progress Notes (Signed)
PPM Remote  

## 2019-04-13 NOTE — Telephone Encounter (Signed)
LMOVM (DPR) advising that transmission from 04/10/19 was received. Next transmission scheduled for 07/06/19. Direct DC phone number provided for any questions/concerns.

## 2019-05-20 ENCOUNTER — Emergency Department
Admission: EM | Admit: 2019-05-20 | Discharge: 2019-05-20 | Disposition: A | Discharge status: Home / Self Care | Payer: Medicaid Other | Attending: Emergency Medicine | Admitting: Emergency Medicine

## 2019-05-20 ENCOUNTER — Other Ambulatory Visit: Payer: Self-pay

## 2019-05-20 DIAGNOSIS — Z91010 Allergy to peanuts: Secondary | ICD-10-CM | POA: Insufficient documentation

## 2019-05-20 DIAGNOSIS — F17210 Nicotine dependence, cigarettes, uncomplicated: Secondary | ICD-10-CM | POA: Diagnosis not present

## 2019-05-20 DIAGNOSIS — Z79899 Other long term (current) drug therapy: Secondary | ICD-10-CM | POA: Insufficient documentation

## 2019-05-20 DIAGNOSIS — I2510 Atherosclerotic heart disease of native coronary artery without angina pectoris: Secondary | ICD-10-CM | POA: Diagnosis not present

## 2019-05-20 DIAGNOSIS — Z7982 Long term (current) use of aspirin: Secondary | ICD-10-CM | POA: Diagnosis not present

## 2019-05-20 DIAGNOSIS — I10 Essential (primary) hypertension: Secondary | ICD-10-CM | POA: Diagnosis not present

## 2019-05-20 DIAGNOSIS — H9202 Otalgia, left ear: Secondary | ICD-10-CM | POA: Diagnosis present

## 2019-05-20 MED ORDER — CEPHALEXIN 500 MG PO CAPS: 500.0000 mg | ORAL_CAPSULE | Freq: Three times a day (TID) | ORAL | 0 refills | Status: DC

## 2019-05-20 NOTE — ED Triage Notes (Signed)
Pt states he woke up to left ear pain and swelling this morning, states it felt like something bit me

## 2019-05-20 NOTE — Discharge Instructions (Signed)
Follow-up with your primary care provider if any continued problems.  It appears that you have an infection of the skin on the outside portion of your left ear.  Begin taking antibiotics every day until completely finished.  You may use warm compresses to your ear 2-3 times a day.  You may take Tylenol or ibuprofen if needed for pain.  Return to the emergency department if any severe worsening of your symptoms.

## 2019-05-20 NOTE — ED Notes (Signed)
See triage note  States he woke up with severe left ear pain this morning    Then he noticed some swelling to left ear  Unsure if he was bitten by something
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
## 2019-07-06 ENCOUNTER — Ambulatory Visit (INDEPENDENT_AMBULATORY_CARE_PROVIDER_SITE_OTHER): Payer: Medicaid Other | Admitting: *Deleted

## 2019-07-06 DIAGNOSIS — I495 Sick sinus syndrome: Secondary | ICD-10-CM | POA: Diagnosis not present

## 2019-07-06 DIAGNOSIS — I5032 Chronic diastolic (congestive) heart failure: Secondary | ICD-10-CM

## 2019-07-06 LAB — CUP PACEART REMOTE DEVICE CHECK
Battery Remaining Longevity: 126 mo
Battery Remaining Percentage: 100 %
Brady Statistic RA Percent Paced: 1 %
Brady Statistic RV Percent Paced: 0 %
Date Time Interrogation Session: 20210517023100
Implantable Lead Implant Date: 20170315
Implantable Lead Implant Date: 20170315
Implantable Lead Location: 753859
Implantable Lead Location: 753860
Implantable Lead Model: 7741
Implantable Lead Model: 7742
Implantable Lead Serial Number: 707950
Implantable Lead Serial Number: 749257
Implantable Pulse Generator Implant Date: 20170315
Lead Channel Impedance Value: 560 Ohm
Lead Channel Impedance Value: 678 Ohm
Lead Channel Pacing Threshold Amplitude: 0.7 V
Lead Channel Pacing Threshold Amplitude: 1 V
Lead Channel Pacing Threshold Pulse Width: 0.4 ms
Lead Channel Pacing Threshold Pulse Width: 0.4 ms
Lead Channel Setting Pacing Amplitude: 2 V
Lead Channel Setting Pacing Amplitude: 2.4 V
Lead Channel Setting Pacing Pulse Width: 0.4 ms
Lead Channel Setting Sensing Sensitivity: 0.6 mV
Pulse Gen Serial Number: 718057

## 2019-07-07 NOTE — Progress Notes (Signed)
Remote pacemaker transmission.   

## 2019-10-08 ENCOUNTER — Ambulatory Visit (INDEPENDENT_AMBULATORY_CARE_PROVIDER_SITE_OTHER): Payer: Medicaid Other | Admitting: *Deleted

## 2019-10-08 DIAGNOSIS — I495 Sick sinus syndrome: Secondary | ICD-10-CM | POA: Diagnosis not present

## 2019-10-09 LAB — CUP PACEART REMOTE DEVICE CHECK
Battery Remaining Longevity: 108 mo
Battery Remaining Percentage: 100 %
Brady Statistic RA Percent Paced: 1 %
Brady Statistic RV Percent Paced: 0 %
Date Time Interrogation Session: 20210819023000
Implantable Lead Implant Date: 20170315
Implantable Lead Implant Date: 20170315
Implantable Lead Location: 753859
Implantable Lead Location: 753860
Implantable Lead Model: 7741
Implantable Lead Model: 7742
Implantable Lead Serial Number: 707950
Implantable Lead Serial Number: 749257
Implantable Pulse Generator Implant Date: 20170315
Lead Channel Impedance Value: 530 Ohm
Lead Channel Impedance Value: 654 Ohm
Lead Channel Pacing Threshold Amplitude: 0.7 V
Lead Channel Pacing Threshold Amplitude: 1.2 V
Lead Channel Pacing Threshold Pulse Width: 0.4 ms
Lead Channel Pacing Threshold Pulse Width: 0.4 ms
Lead Channel Setting Pacing Amplitude: 2 V
Lead Channel Setting Pacing Amplitude: 2.4 V
Lead Channel Setting Pacing Pulse Width: 0.4 ms
Lead Channel Setting Sensing Sensitivity: 0.6 mV
Pulse Gen Serial Number: 718057

## 2019-10-12 NOTE — Progress Notes (Signed)
Remote pacemaker transmission.   
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
## 2020-09-20 ENCOUNTER — Other Ambulatory Visit: Payer: Self-pay | Admitting: Family Medicine

## 2020-09-20 ENCOUNTER — Ambulatory Visit
Admission: RE | Admit: 2020-09-20 | Discharge: 2020-09-20 | Disposition: A | Discharge status: Home / Self Care | Payer: Medicaid Other | Source: Ambulatory Visit | Attending: Family Medicine | Admitting: Family Medicine

## 2020-09-20 ENCOUNTER — Other Ambulatory Visit
Admission: RE | Admit: 2020-09-20 | Discharge: 2020-09-20 | Disposition: A | Discharge status: Home / Self Care | Payer: Medicaid Other | Source: Intra-hospital | Attending: Family Medicine | Admitting: Family Medicine

## 2020-09-20 DIAGNOSIS — R52 Pain, unspecified: Secondary | ICD-10-CM | POA: Diagnosis not present
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
## 2021-03-22 ENCOUNTER — Other Ambulatory Visit: Payer: Self-pay

## 2021-03-22 ENCOUNTER — Ambulatory Visit (INDEPENDENT_AMBULATORY_CARE_PROVIDER_SITE_OTHER): Payer: Medicaid Other | Admitting: Internal Medicine

## 2021-03-22 ENCOUNTER — Encounter: Payer: Self-pay | Admitting: Internal Medicine

## 2021-03-22 VITALS — BP 150/90 | HR 76 | Ht 70.0 in

## 2021-03-22 DIAGNOSIS — I2510 Atherosclerotic heart disease of native coronary artery without angina pectoris: Secondary | ICD-10-CM

## 2021-03-22 DIAGNOSIS — I10 Essential (primary) hypertension: Secondary | ICD-10-CM | POA: Diagnosis not present

## 2021-03-22 DIAGNOSIS — M7989 Other specified soft tissue disorders: Secondary | ICD-10-CM

## 2021-03-22 DIAGNOSIS — R0602 Shortness of breath: Secondary | ICD-10-CM

## 2021-03-22 MED ORDER — FUROSEMIDE 40 MG PO TABS: 60.0000 mg | ORAL_TABLET | Freq: Two times a day (BID) | ORAL | 0 refills | Status: DC

## 2021-03-22 MED ORDER — POTASSIUM CHLORIDE CRYS ER 20 MEQ PO TBCR: 20.0000 meq | EXTENDED_RELEASE_TABLET | Freq: Two times a day (BID) | ORAL | 2 refills | Status: DC

## 2021-03-22 NOTE — Patient Instructions (Addendum)
Medication Instructions:   Your physician has recommended you make the following change in your medication:   STOP over-the-counter Potassium  START Potassium 20 mEq TWICE daily - A new Rx has been sent to your pharmacy  INCREASE Lasix 60 mg TWICE daily - A new Rx has been sent to your pharmacy   *If you need a refill on your cardiac medications before your next appointment, please call your pharmacy*   Lab Work:  Your physician recommends that you return for lab work in: 1-2 WEEKS (BMET)    - This lab does not require fasting  Testing/Procedures:  Your physician has requested that you have an echocardiogram. Echocardiography is a painless test that uses sound waves to create images of your heart. It provides your doctor with information about the size and shape of your heart and how well your hearts chambers and valves are working. This procedure takes approximately one hour. There are no restrictions for this procedure.   Follow-Up: At [facility], you and your health needs are our priority.  As part of our continuing mission to provide you with exceptional heart care, we have created designated Provider Care Teams.  These Care Teams include your primary Cardiologist (physician) and Advanced Practice Providers (APPs -  Physician Assistants and Nurse Practitioners) who all work together to provide you with the care you need, when you need it.  We recommend signing up for the patient portal called "MyChart".  Sign up information is provided on this After Visit Summary.  MyChart is used to connect with patients for Virtual Visits (Telemedicine).  Patients are able to view lab/test results, encounter notes, upcoming appointments, etc.  Non-urgent messages can be sent to your provider as well.   To learn more about what you can do with MyChart, go to [email or web address].    Your next appointment:    1) Establish EP for device clinic in our office  2) 4-6 weeks AFTER echo with  Dr. Okey Dupre or APP  The format for your next appointment:   In Person  Provider:   You may see Dr. Cristal Deer End or one of the following Advanced Practice Providers on your designated Care Team:   Nicolasa Ducking, NP Eula Listen, PA-C Cadence Fransico Michael, [place]

## 2021-03-29 ENCOUNTER — Other Ambulatory Visit: Payer: Medicaid Other

## 2021-04-05 ENCOUNTER — Encounter (INDEPENDENT_AMBULATORY_CARE_PROVIDER_SITE_OTHER): Payer: Self-pay | Admitting: Nurse Practitioner

## 2021-04-05 ENCOUNTER — Encounter (INDEPENDENT_AMBULATORY_CARE_PROVIDER_SITE_OTHER): Payer: Self-pay

## 2021-04-05 ENCOUNTER — Other Ambulatory Visit (INDEPENDENT_AMBULATORY_CARE_PROVIDER_SITE_OTHER): Payer: Self-pay | Admitting: Nurse Practitioner

## 2021-04-05 ENCOUNTER — Other Ambulatory Visit (INDEPENDENT_AMBULATORY_CARE_PROVIDER_SITE_OTHER): Payer: Self-pay | Admitting: Family Medicine

## 2021-04-05 DIAGNOSIS — M79605 Pain in left leg: Secondary | ICD-10-CM

## 2021-04-05 DIAGNOSIS — M79604 Pain in right leg: Secondary | ICD-10-CM

## 2021-04-14 ENCOUNTER — Ambulatory Visit (INDEPENDENT_AMBULATORY_CARE_PROVIDER_SITE_OTHER): Payer: Medicaid Other

## 2021-04-14 ENCOUNTER — Other Ambulatory Visit: Payer: Self-pay

## 2021-04-14 DIAGNOSIS — M7989 Other specified soft tissue disorders: Secondary | ICD-10-CM | POA: Diagnosis not present

## 2021-04-14 DIAGNOSIS — R0602 Shortness of breath: Secondary | ICD-10-CM

## 2021-04-14 LAB — ECHOCARDIOGRAM COMPLETE
AR max vel: 4.76 cm2
AV Area VTI: 4.89 cm2
AV Area mean vel: 4.97 cm2
AV Mean grad: 4 mmHg
AV Peak grad: 9.1 mmHg
Ao pk vel: 1.51 m/s
Area-P 1/2: 4.06 cm2
S' Lateral: 3.1 cm

## 2021-04-14 MED ORDER — PERFLUTREN LIPID MICROSPHERE
1.0000 mL | INTRAVENOUS | Status: AC | PRN
  Administered 2021-04-14: 2 mL via INTRAVENOUS

## 2021-04-26 ENCOUNTER — Ambulatory Visit: Payer: Medicaid Other | Admitting: Nurse Practitioner

## 2021-04-26 ENCOUNTER — Encounter: Payer: Medicaid Other | Admitting: Cardiology

## 2021-04-27 ENCOUNTER — Ambulatory Visit: Payer: Medicaid Other | Admitting: Nurse Practitioner

## 2021-05-19 ENCOUNTER — Encounter: Payer: Self-pay | Admitting: Nurse Practitioner

## 2021-05-19 ENCOUNTER — Ambulatory Visit: Payer: Medicaid Other | Admitting: Nurse Practitioner
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
## 2021-05-22 ENCOUNTER — Encounter: Payer: Self-pay | Admitting: Nurse Practitioner

## 2021-06-07 ENCOUNTER — Encounter: Payer: Medicaid Other | Admitting: Cardiology

## 2021-06-07 NOTE — Progress Notes (Deleted)
Electrophysiology Office Note:    Date:  06/07/2021   ID:  Seth Tucker, DOB Jul 12, 1965, MRN [id number]  PCP:  Center, [facility] Cardiologist:  None  [facility] Electrophysiologist:  Lanier Prude, MD   Referring MD: Center, [facility]*   Chief Complaint: PPM  History of Present Illness:    Seth Tucker is a 56 y.o. male who presents for an evaluation of PPM at the request of Dr End. They were previously seen by Dr Graciela Husbands and Ladona Ridgel. Saw Graciela Husbands last 05/07/2017. 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   He was seen 03/22/2021 for dyspnea by Dr End. He had swelling at that appointment. His diuretic was increased and he presents today to reestablish PPM monitoring.   Past Medical History:  Diagnosis Date   CAD (coronary artery disease)    a. 04/2015 Cath: LM nl, LAD 10ost/p, RI nl, LCX 10p, OM1/2/3 nl, RCA nl. Nl LV fxn.   Chronic heart failure with preserved ejection fraction (HFpEF) (HCC)    a 03/2021 Echo: EF 60-65%, no rwma, mild LVH, nl RV fxn, mild MR, Ao sclerosis.   Depression    Hyperlipidemia    LVH (left ventricular hypertrophy)    Seizure ([facility]CC)    Sleep apnea    SSS (sick sinus syndrome) (HCC)    a. 04/2015 s/p BSX DC PPM (ser #[id number]).   Vitamin D deficiency     Past Surgical History:  Procedure Laterality Date   CARDIAC CATHETERIZATION N/A 05/04/2015   Procedure: Left Heart Cath and Coronary Angiography;  Surgeon: Iran Ouch, MD;  [facility]: [facility];  Service: Cardiovascular;  Laterality: N/A;   EP IMPLANTABLE DEVICE N/A 05/04/2015   Procedure: Pacemaker Implant;  Surgeon: Marinus Maw, MD;  Location: [facility] INVASIVE CV LAB;  Service: Cardiovascular;  Laterality: N/A;    Current Medications: No outpatient medications have been marked as taking for the 06/07/21 encounter  (Appointment) with Lanier Prude, MD.     Allergies:   Bee venom, Hornet venom, Influenza vaccines, Peanuts [peanut oil], Phenytoin, Valproic acid, and Lamotrigine   Social History   Socioeconomic History   Marital status: Single    Spouse name: Not on file   Number of children: Not on file   Years of education: Not on file   Highest education level: Not on file  Occupational History   Not on file  Tobacco Use   Smoking status: Every Day    Packs/day: 0.50    Years: 20.00    Pack years: 10.00    Types: Cigarettes   Smokeless tobacco: Never   Tobacco comments:    6 a day.  Vaping Use   Vaping Use: Never used  Substance and Sexual Activity   Alcohol use: No   Drug use: No   Sexual activity: Not Currently  Other Topics Concern   Not on file  Social History Narrative   Not on file   Social Determinants of Health   Financial Resource Strain: Not on file  Food Insecurity: Not on file  Transportation Needs: Not on file  Physical Activity: Not on file  Stress: Not on file  Social Connections: Not on file     Family History: The patient's family history includes Diabetes in an other family member; Emphysema in his mother; Heart disease in an other family member; Hypertension in  his brother and mother.  ROS:   Please see the history of present illness.    All other systems reviewed and are negative.  EKGs/Labs/Other Studies Reviewed:    The following studies were reviewed today:  04/14/2021 Echo LVEF 60% RV normal Mild MR  02/05/2019 CXR Personally reviewed 2 lead ppm in appropriate position  06/07/2021 in clinic device interrogation ***  EKG:  The ekg ordered today demonstrates ***   Recent Labs: No results found for requested labs within last 8760 hours.  Recent Lipid Panel    Component Value Date/Time   CHOL 158 05/04/2015 0240   TRIG 47 05/04/2015 0240   HDL 51 05/04/2015 0240   CHOLHDL 3.1 05/04/2015 0240   VLDL 9 05/04/2015 0240    LDLCALC 98 05/04/2015 0240    Physical Exam:    VS:  There were no vitals taken for this visit.    Wt Readings from Last 3 Encounters:  05/20/19 240 lb (108.9 kg)  04/03/19 268 lb 3.2 oz (121.7 kg)  01/17/19 240 lb (108.9 kg)     GEN: *** Well nourished, well developed in no acute distress HEENT: Normal NECK: No JVD; No carotid bruits LYMPHATICS: No lymphadenopathy CARDIAC: ***RRR, no murmurs, rubs, gallops RESPIRATORY:  Clear to auscultation without rales, wheezing or rhonchi  ABDOMEN: Soft, non-tender, non-distended MUSCULOSKELETAL:  No edema; No deformity  SKIN: Warm and dry NEUROLOGIC:  Alert and oriented x 3 PSYCHIATRIC:  Normal affect       ASSESSMENT:    1. Sick sinus syndrome (HCC)   2. Cardiac pacemaker in situ   3. Chronic diastolic heart failure (HCC)    PLAN:    In order of problems listed above:  #SSS s/p PPM Device functioning appropriately Reestablish for remotes  #chronic diastolic HF    F/u 1 year with EP APP       Total time spent with patient today *** minutes. This includes reviewing records, evaluating the patient and coordinating care.  Medication Adjustments/Labs and Tests Ordered: Current medicines are reviewed at length with the patient today.  Concerns regarding medicines are outlined above.  No orders of the defined types were placed in this encounter.  No orders of the defined types were placed in this encounter.    Signed, Rossie Muskrat. Lalla Brothers, MD, [facility], [facility] 06/07/2021 7:02 AM    Electrophysiology [facility]

## 2021-06-08 ENCOUNTER — Encounter: Payer: Self-pay | Admitting: Cardiology

## 2021-08-03 ENCOUNTER — Encounter: Payer: Self-pay | Admitting: Surgery

## 2021-08-03 ENCOUNTER — Ambulatory Visit (INDEPENDENT_AMBULATORY_CARE_PROVIDER_SITE_OTHER): Payer: Medicaid Other | Admitting: Surgery

## 2021-08-03 VITALS — BP 164/77 | HR 60 | Temp 98.0°F | Ht 70.0 in | Wt 279.2 lb

## 2021-08-03 DIAGNOSIS — L720 Epidermal cyst: Secondary | ICD-10-CM

## 2021-08-03 NOTE — Patient Instructions (Signed)
If you have any concerns or questions, please feel free to call our office.    Epidermoid Cyst  An epidermoid cyst, also called an epidermal cyst, is a small lump under your skin. The cyst contains a substance called keratin. Do not try to pop or open the cyst yourself. What are the causes? A blocked hair follicle. A hair that curls and re-enters the skin instead of growing straight out of the skin. A blocked pore. Irritated skin. An injury to the skin. Certain conditions that are passed along from parent to child. Human papillomavirus (HPV). This happens rarely when cysts occur on the bottom of the feet. Long-term sun damage to the skin. What increases the risk? Having acne. Being male. Having an injury to the skin. Being past puberty. Having certain conditions caused by genes (genetic disorder) What are the signs or symptoms? These cysts are usually harmless, but they can get infected. Symptoms of infection may include: Redness. Inflammation. Tenderness. Warmth. Fever. A bad-smelling substance that drains from the cyst. Pus that drains from the cyst. How is this treated? In many cases, epidermoid cysts go away on their own without treatment. If a cyst becomes infected, treatment may include: Opening and draining the cyst, done by a doctor. After draining, you may need minor surgery to remove the rest of the cyst. Antibiotic medicine. Shots of medicines (steroids) that help to reduce inflammation. Surgery to remove the cyst. Surgery may be done if the cyst: Becomes large. Bothers you. Has a chance of turning into cancer. Do not try to open a cyst yourself. Follow these instructions at home: Medicines Take over-the-counter and prescription medicines as told by your doctor. If you were prescribed an antibiotic medicine, take it as told by your doctor. Do not stop taking it even if you start to feel better. General instructions Keep the area around your cyst clean and  dry. Wear loose, dry clothing. Avoid touching your cyst. Check your cyst every day for signs of infection. Check for: Redness, swelling, or pain. Fluid or blood. Warmth. Pus or a bad smell. Keep all follow-up visits. How is this prevented? Wear clean, dry, clothing. Avoid wearing tight clothing. Keep your skin clean and dry. Take showers or baths every day. Contact a doctor if: Your cyst has symptoms of infection. Your condition does not improve or gets worse. You have a cyst that looks different from other cysts you have had. You have a fever. Get help right away if: Redness spreads from the cyst into the area close by. Summary An epidermoid cyst is a small lump under your skin. If a cyst becomes infected, treatment may include surgery to open and drain the cyst, or to remove it. Take over-the-counter and prescription medicines only as told by your doctor. Contact a doctor if your condition is not improving or is getting worse. Keep all follow-up visits. This information is not intended to replace advice given to you by your health care provider. Make sure you discuss any questions you have with your health care provider. Document Revised: 05/13/2019 Document Reviewed: 05/13/2019 Elsevier Patient Education  2023 Elsevier Inc.  
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
## 2021-08-16 ENCOUNTER — Ambulatory Visit (INDEPENDENT_AMBULATORY_CARE_PROVIDER_SITE_OTHER): Payer: Self-pay

## 2021-08-16 ENCOUNTER — Encounter (INDEPENDENT_AMBULATORY_CARE_PROVIDER_SITE_OTHER): Payer: Self-pay | Admitting: Nurse Practitioner

## 2021-08-16 ENCOUNTER — Ambulatory Visit (INDEPENDENT_AMBULATORY_CARE_PROVIDER_SITE_OTHER): Payer: Self-pay | Admitting: Nurse Practitioner

## 2021-08-16 VITALS — BP 177/82 | HR 73 | Resp 16 | Wt 280.0 lb

## 2021-08-16 DIAGNOSIS — M79604 Pain in right leg: Secondary | ICD-10-CM

## 2021-08-16 DIAGNOSIS — M79605 Pain in left leg: Secondary | ICD-10-CM

## 2021-08-16 DIAGNOSIS — R600 Localized edema: Secondary | ICD-10-CM

## 2021-09-02 ENCOUNTER — Encounter (INDEPENDENT_AMBULATORY_CARE_PROVIDER_SITE_OTHER): Payer: Self-pay | Admitting: Nurse Practitioner
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
## 2021-10-03 ENCOUNTER — Encounter (INDEPENDENT_AMBULATORY_CARE_PROVIDER_SITE_OTHER): Payer: Medicaid Other

## 2021-10-03 ENCOUNTER — Ambulatory Visit (INDEPENDENT_AMBULATORY_CARE_PROVIDER_SITE_OTHER): Payer: Medicaid Other | Admitting: Nurse Practitioner

## 2021-10-10 ENCOUNTER — Ambulatory Visit (INDEPENDENT_AMBULATORY_CARE_PROVIDER_SITE_OTHER): Payer: Medicaid Other | Admitting: Nurse Practitioner

## 2021-10-10 ENCOUNTER — Encounter (INDEPENDENT_AMBULATORY_CARE_PROVIDER_SITE_OTHER): Payer: Medicaid Other

## 2021-12-18 ENCOUNTER — Encounter (INDEPENDENT_AMBULATORY_CARE_PROVIDER_SITE_OTHER): Payer: Self-pay

## 2022-03-28 ENCOUNTER — Observation Stay
Admission: EM | Admit: 2022-03-28 | Discharge: 2022-03-29 | Disposition: A | Discharge status: Home / Self Care | Payer: Medicaid Other | Attending: Internal Medicine | Admitting: Internal Medicine

## 2022-03-28 ENCOUNTER — Encounter: Payer: Self-pay | Admitting: Internal Medicine

## 2022-03-28 ENCOUNTER — Other Ambulatory Visit (HOSPITAL_COMMUNITY): Payer: Self-pay

## 2022-03-28 ENCOUNTER — Emergency Department: Payer: Medicaid Other

## 2022-03-28 ENCOUNTER — Other Ambulatory Visit: Payer: Self-pay

## 2022-03-28 DIAGNOSIS — F17210 Nicotine dependence, cigarettes, uncomplicated: Secondary | ICD-10-CM | POA: Diagnosis not present

## 2022-03-28 DIAGNOSIS — R590 Localized enlarged lymph nodes: Secondary | ICD-10-CM | POA: Diagnosis present

## 2022-03-28 DIAGNOSIS — I10 Essential (primary) hypertension: Secondary | ICD-10-CM

## 2022-03-28 DIAGNOSIS — Z7982 Long term (current) use of aspirin: Secondary | ICD-10-CM | POA: Diagnosis not present

## 2022-03-28 DIAGNOSIS — L03317 Cellulitis of buttock: Secondary | ICD-10-CM | POA: Diagnosis not present

## 2022-03-28 DIAGNOSIS — Z720 Tobacco use: Secondary | ICD-10-CM

## 2022-03-28 DIAGNOSIS — Z950 Presence of cardiac pacemaker: Secondary | ICD-10-CM | POA: Insufficient documentation

## 2022-03-28 DIAGNOSIS — I5033 Acute on chronic diastolic (congestive) heart failure: Secondary | ICD-10-CM | POA: Insufficient documentation

## 2022-03-28 DIAGNOSIS — R2240 Localized swelling, mass and lump, unspecified lower limb: Secondary | ICD-10-CM | POA: Insufficient documentation

## 2022-03-28 DIAGNOSIS — Z79899 Other long term (current) drug therapy: Secondary | ICD-10-CM | POA: Diagnosis not present

## 2022-03-28 DIAGNOSIS — I2510 Atherosclerotic heart disease of native coronary artery without angina pectoris: Secondary | ICD-10-CM | POA: Insufficient documentation

## 2022-03-28 DIAGNOSIS — R0602 Shortness of breath: Secondary | ICD-10-CM | POA: Diagnosis present

## 2022-03-28 DIAGNOSIS — R569 Unspecified convulsions: Secondary | ICD-10-CM

## 2022-03-28 DIAGNOSIS — I5032 Chronic diastolic (congestive) heart failure: Secondary | ICD-10-CM

## 2022-03-28 DIAGNOSIS — Z6841 Body Mass Index (BMI) 40.0 and over, adult: Secondary | ICD-10-CM

## 2022-03-28 DIAGNOSIS — L0231 Cutaneous abscess of buttock: Secondary | ICD-10-CM

## 2022-03-28 DIAGNOSIS — I2699 Other pulmonary embolism without acute cor pulmonale: Principal | ICD-10-CM | POA: Diagnosis present

## 2022-03-28 DIAGNOSIS — E785 Hyperlipidemia, unspecified: Secondary | ICD-10-CM | POA: Diagnosis present

## 2022-03-28 DIAGNOSIS — R600 Localized edema: Secondary | ICD-10-CM

## 2022-03-28 LAB — CBC WITH DIFFERENTIAL/PLATELET
Abs Immature Granulocytes: 0.03 10*3/uL (ref 0.00–0.07)
Basophils Absolute: 0 10*3/uL (ref 0.0–0.1)
Basophils Relative: 0 %
Eosinophils Absolute: 0.3 10*3/uL (ref 0.0–0.5)
Eosinophils Relative: 3 %
HCT: 42.4 % (ref 39.0–52.0)
Hemoglobin: 13.6 g/dL (ref 13.0–17.0)
Immature Granulocytes: 0 %
Lymphocytes Relative: 27 %
Lymphs Abs: 3.4 10*3/uL (ref 0.7–4.0)
MCH: 28.8 pg (ref 26.0–34.0)
MCHC: 32.1 g/dL (ref 30.0–36.0)
MCV: 89.6 fL (ref 80.0–100.0)
Monocytes Absolute: 1.2 10*3/uL — ABNORMAL HIGH (ref 0.1–1.0)
Monocytes Relative: 9 %
Neutro Abs: 7.7 10*3/uL (ref 1.7–7.7)
Neutrophils Relative %: 61 %
Platelets: 271 10*3/uL (ref 150–400)
RBC: 4.73 MIL/uL (ref 4.22–5.81)
RDW: 13.4 % (ref 11.5–15.5)
WBC: 12.7 10*3/uL — ABNORMAL HIGH (ref 4.0–10.5)
nRBC: 0 % (ref 0.0–0.2)

## 2022-03-28 LAB — COMPREHENSIVE METABOLIC PANEL
ALT: 17 U/L (ref 0–44)
AST: 18 U/L (ref 15–41)
Albumin: 3.7 g/dL (ref 3.5–5.0)
Alkaline Phosphatase: 71 U/L (ref 38–126)
Anion gap: 9 (ref 5–15)
BUN: 11 mg/dL (ref 6–20)
CO2: 23 mmol/L (ref 22–32)
Calcium: 9.1 mg/dL (ref 8.9–10.3)
Chloride: 106 mmol/L (ref 98–111)
Creatinine, Ser: 0.68 mg/dL (ref 0.61–1.24)
GFR, Estimated: >60 mL/min (ref >60)
Glucose, Bld: 117 mg/dL — ABNORMAL HIGH (ref 70–99)
Potassium: 4.1 mmol/L (ref 3.5–5.1)
Sodium: 138 mmol/L (ref 135–145)
Total Bilirubin: 0.5 mg/dL (ref 0.3–1.2)
Total Protein: 8 g/dL (ref 6.5–8.1)

## 2022-03-28 LAB — LACTIC ACID, PLASMA: Lactic Acid, Venous: 1.1 mmol/L (ref 0.5–1.9)

## 2022-03-28 LAB — PROTIME-INR
INR: 1.1 (ref 0.8–1.2)
Prothrombin Time: 13.9 seconds (ref 11.4–15.2)

## 2022-03-28 LAB — SEDIMENTATION RATE: Sed Rate: 35 mm/hr — ABNORMAL HIGH (ref 0–20)

## 2022-03-28 LAB — HIV ANTIBODY (ROUTINE TESTING W REFLEX): HIV Screen 4th Generation wRfx: NONREACTIVE

## 2022-03-28 LAB — BRAIN NATRIURETIC PEPTIDE: B Natriuretic Peptide: 35.3 pg/mL (ref 0.0–100.0)

## 2022-03-28 LAB — C-REACTIVE PROTEIN: CRP: 4.8 mg/dL — ABNORMAL HIGH (ref <1.0)

## 2022-03-28 LAB — HEPARIN LEVEL (UNFRACTIONATED): Heparin Unfractionated: 0.25 IU/mL — ABNORMAL LOW (ref 0.30–0.70)

## 2022-03-28 LAB — APTT: aPTT: 35 seconds (ref 24–36)

## 2022-03-28 LAB — TROPONIN I (HIGH SENSITIVITY): Troponin I (High Sensitivity): 14 ng/L (ref <18)

## 2022-03-28 MED ORDER — NITROGLYCERIN 0.4 MG SL SUBL: 0.4000 mg | SUBLINGUAL_TABLET | SUBLINGUAL | Status: DC | PRN

## 2022-03-28 MED ORDER — VITAMIN D 25 MCG (1000 UNIT) PO TABS
1000.0000 [IU] | ORAL_TABLET | Freq: Every day | ORAL | Status: DC
  Administered 2022-03-28 – 2022-03-29 (×2): 1000 [IU] via ORAL
  Filled 2022-03-28 (×2): qty 1

## 2022-03-28 MED ORDER — MORPHINE SULFATE (PF) 2 MG/ML IV SOLN: 2.0000 mg | INTRAVENOUS | Status: DC | PRN

## 2022-03-28 MED ORDER — VANCOMYCIN HCL 2000 MG/400ML IV SOLN
2000.0000 mg | Freq: Once | INTRAVENOUS | Status: AC
  Administered 2022-03-28: 2000 mg via INTRAVENOUS
  Filled 2022-03-28: qty 400

## 2022-03-28 MED ORDER — HEPARIN BOLUS VIA INFUSION
1500.0000 [IU] | Freq: Once | INTRAVENOUS | Status: AC
  Administered 2022-03-28: 1500 [IU] via INTRAVENOUS
  Filled 2022-03-28: qty 1500

## 2022-03-28 MED ORDER — DM-GUAIFENESIN ER 30-600 MG PO TB12: 1.0000 | ORAL_TABLET | Freq: Two times a day (BID) | ORAL | Status: DC | PRN

## 2022-03-28 MED ORDER — NICOTINE 21 MG/24HR TD PT24
21.0000 mg | MEDICATED_PATCH | Freq: Every day | TRANSDERMAL | Status: DC
  Filled 2022-03-28: qty 1

## 2022-03-28 MED ORDER — SODIUM CHLORIDE 0.9 % IV SOLN: 2.0000 g | INTRAVENOUS | Status: DC

## 2022-03-28 MED ORDER — SODIUM CHLORIDE 0.9 % IV SOLN
2.0000 g | INTRAVENOUS | Status: DC
  Administered 2022-03-28: 2 g via INTRAVENOUS
  Filled 2022-03-28 (×2): qty 20

## 2022-03-28 MED ORDER — ATORVASTATIN CALCIUM 20 MG PO TABS
40.0000 mg | ORAL_TABLET | Freq: Every day | ORAL | Status: DC
  Administered 2022-03-28 – 2022-03-29 (×2): 40 mg via ORAL
  Filled 2022-03-28 (×2): qty 2

## 2022-03-28 MED ORDER — HEPARIN BOLUS VIA INFUSION
6000.0000 [IU] | Freq: Once | INTRAVENOUS | Status: AC
  Administered 2022-03-28: 6000 [IU] via INTRAVENOUS
  Filled 2022-03-28: qty 6000

## 2022-03-28 MED ORDER — FUROSEMIDE 10 MG/ML IJ SOLN
40.0000 mg | Freq: Once | INTRAMUSCULAR | Status: AC
  Administered 2022-03-28: 40 mg via INTRAVENOUS
  Filled 2022-03-28: qty 4

## 2022-03-28 MED ORDER — PHENOBARBITAL 97.2 MG PO TABS
194.4000 mg | ORAL_TABLET | Freq: Every day | ORAL | Status: DC
  Administered 2022-03-28: 194.4 mg via ORAL
  Filled 2022-03-28: qty 2

## 2022-03-28 MED ORDER — IOHEXOL 350 MG/ML SOLN
75.0000 mL | Freq: Once | INTRAVENOUS | Status: AC | PRN
  Administered 2022-03-28: 75 mL via INTRAVENOUS

## 2022-03-28 MED ORDER — MOMETASONE FURO-FORMOTEROL FUM 200-5 MCG/ACT IN AERO
2.0000 | INHALATION_SPRAY | Freq: Two times a day (BID) | RESPIRATORY_TRACT | Status: DC
  Administered 2022-03-28 – 2022-03-29 (×2): 2 via RESPIRATORY_TRACT
  Filled 2022-03-28: qty 8.8

## 2022-03-28 MED ORDER — OXYCODONE-ACETAMINOPHEN 5-325 MG PO TABS
1.0000 | ORAL_TABLET | ORAL | Status: DC | PRN
  Administered 2022-03-29: 1 via ORAL
  Filled 2022-03-28: qty 1

## 2022-03-28 MED ORDER — HEPARIN (PORCINE) 25000 UT/250ML-% IV SOLN
1900.0000 [IU]/h | INTRAVENOUS | Status: DC
  Administered 2022-03-28: 1700 [IU]/h via INTRAVENOUS
  Administered 2022-03-29: 1900 [IU]/h via INTRAVENOUS
  Filled 2022-03-28 (×2): qty 250

## 2022-03-28 MED ORDER — ASPIRIN 81 MG PO TBEC
81.0000 mg | DELAYED_RELEASE_TABLET | Freq: Every day | ORAL | Status: DC
  Administered 2022-03-28 – 2022-03-29 (×2): 81 mg via ORAL
  Filled 2022-03-28 (×2): qty 1

## 2022-03-28 MED ORDER — VANCOMYCIN HCL 1500 MG/300ML IV SOLN
1500.0000 mg | Freq: Two times a day (BID) | INTRAVENOUS | Status: DC
  Administered 2022-03-28 – 2022-03-29 (×2): 1500 mg via INTRAVENOUS
  Filled 2022-03-28 (×2): qty 300

## 2022-03-28 MED ORDER — LORAZEPAM 2 MG/ML IJ SOLN: 2.0000 mg | INTRAMUSCULAR | Status: DC | PRN

## 2022-03-28 MED ORDER — FUROSEMIDE 40 MG PO TABS: 60.0000 mg | ORAL_TABLET | ORAL | Status: DC | PRN

## 2022-03-28 MED ORDER — ALBUTEROL SULFATE (2.5 MG/3ML) 0.083% IN NEBU: 3.0000 mL | INHALATION_SOLUTION | RESPIRATORY_TRACT | Status: DC | PRN

## 2022-03-28 MED ORDER — PHENOBARBITAL 100 MG PO TABS: 200.0000 mg | ORAL_TABLET | Freq: Every day | ORAL | Status: DC

## 2022-03-28 NOTE — ED Notes (Signed)
Informed RN bed assigned 

## 2022-03-28 NOTE — Consult Note (Signed)
PHARMACY -  BRIEF ANTIBIOTIC NOTE   Pharmacy has received consult(s) for vancomycin from an ED provider.  The patient's profile has been reviewed for ht/wt/allergies/indication/available labs.    One time order(s) placed for  --Vancomycin 2 g IV  Further antibiotics/pharmacy consults should be ordered by admitting physician if indicated.                       Thank you, Benita Gutter 03/28/2022  10:45 AM

## 2022-03-28 NOTE — ED Provider Notes (Addendum)
[facility] Provider Note    Event Date/Time   First MD Initiated Contact with Patient 03/28/22 0830     (approximate)   History   Leg Swelling   HPI  Seth Tucker is a 57 y.o. male   Past medical history of CHF with preserved ejection fraction, CAD, hyperlipidemia, seizures, sick sinus syndrome with pacemaker placement, OSA, presents to the emergency department with leg swelling and shortness of breath.  Right leg swelling is greater than left, has been ongoing for around 6 months.  He states he was referred to the emergency department to rule out blood clot.  He has dyspnea at baseline but worst lately, exertionally to the point of difficulty getting across the room.   He states that he was at his doctor's office to be assessed for a lesion that had appeared on his left buttock, but the doctor referred him to the emergency department because of the profound right greater than left leg swelling to rule out DVT.  And his partner who is at bedside state that the leg swelling has been ongoing for nearly 1 year and exertional dyspnea for nearly 1 year as well, not acutely worsened, and not associated with any new cough, and has not experienced fever, chest pain or any other acute medical problems. Exertional dyspnea worsening and now severe.   Independent Historian contributed to assessment above: Patient's partner at bedside  External Medical Documents Reviewed: Vascular surgery note dated June 2023 that notes bilateral lower extremity edema thought likely to be component of venous insufficiency or reflux, negative for peripheral artery disease      Physical Exam   Triage Vital Signs: ED Triage Vitals  Enc Vitals Group     BP 03/28/22 0825 (!) 182/79     Pulse Rate 03/28/22 0825 79     Resp 03/28/22 0825 20     Temp 03/28/22 0825 97.6 F (36.4 C)     Temp src --      SpO2 03/28/22 0825 100 %     Weight 03/28/22 0830 279 lb 15.8 oz (127 kg)      Height --      Head Circumference --      Peak Flow --      Pain Score 03/28/22 0830 0     Pain Loc --      Pain Edu? --      Excl. in [place]? --     Most recent vital signs: Vitals:   03/28/22 0825 03/28/22 0930  BP: (!) 182/79 (!) 157/90  Pulse: 79 69  Resp: 20 15  Temp: 97.6 F (36.4 C)   SpO2: 100% 99%    General: Awake, no distress.  CV:  Good peripheral perfusion.  Lateral lower extremity edema, much greater on the right side up to the thigh, firm, nontender.  Pitting edema to the left.  Neurovascular intact to both. Resp:  Normal effort.  Comfortable unlabored normal respiratory rate, no hypoxemia, no wheezing rales or focality to auscultation Abd:  No distention.  Nontender Other:  Comfortable nontoxic appearance speaking in full sentences  There is indurated lesion to the left buttock with some scant active oozing of pus and some scant cellulitic changes surrounding.   ED Results / Procedures / Treatments   Labs (all labs ordered are listed, but only abnormal results are displayed) Labs Reviewed  COMPREHENSIVE METABOLIC PANEL - Abnormal; Notable for the following components:      Result Value  Glucose, Bld 117 (*)    All other components within normal limits  CBC WITH DIFFERENTIAL/PLATELET - Abnormal; Notable for the following components:   WBC 12.7 (*)    Monocytes Absolute 1.2 (*)    All other components within normal limits  BRAIN NATRIURETIC PEPTIDE  LACTIC ACID, PLASMA  LACTIC ACID, PLASMA  TROPONIN I (HIGH SENSITIVITY)  TROPONIN I (HIGH SENSITIVITY)     I ordered and reviewed the above labs they are notable for WBC 12.7 and normal Cr.   EKG  ED ECG REPORT I, Lucillie Garfinkel, the attending physician, personally viewed and interpreted this ECG.   Date: 03/28/2022  EKG Time: 0836  Rate: 78  Rhythm: nsr  Axis: nl  Intervals:none  ST&T Change: no acute ischemic changes    RADIOLOGY I independently reviewed and interpreted chest x-ray and see no  focal opacities or pneumothorax   PROCEDURES:  Critical Care performed: No  Procedures   MEDICATIONS ORDERED IN ED: Medications  iohexol (OMNIPAQUE) 350 MG/ML injection 75 mL (75 mLs Intravenous Contrast Given 03/28/22 0947)    External physician / consultants:  I spoke with hospitalist for admission & regarding care plan for this patient.   IMPRESSION / MDM / ASSESSMENT AND PLAN / ED COURSE  I reviewed the triage vital signs and the nursing notes.                                Patient's presentation is most consistent with acute presentation with potential threat to life or bodily function.  Differential diagnosis includes, but is not limited to, CHF exacerbation, venous insufficiency, DVT, PE, ACS, abscess/cellulitis   The patient is on the cardiac monitor to evaluate for evidence of arrhythmia and/or significant heart rate changes.  MDM: Patient with chronic symptoms right greater than left lower extremity edema as well as exertional dyspnea sent by PMD for rule out of DVT.  No acute symptoms per patient or partner.  Will check EKG, troponins, proBNP, chest x-ray, DVT ultrasound, CTA r/o dvt and pe.   Pulmonary embolism noted on CT angiogram, curiously DVT scan of the right lower extremity is negative.  No right heart strain.  Hemodynamics appropriate and reassuring however the patient will be admitted given profound dyspnea on exertion, started on heparin drip as well as vancomycin IV for purulent cellulitis of the buttock.         FINAL CLINICAL IMPRESSION(S) / ED DIAGNOSES   Final diagnoses:  Leg edema  Cellulitis and abscess of buttock  Acute pulmonary embolism, unspecified pulmonary embolism type, unspecified whether acute cor pulmonale present ([place])     Rx / DC Orders   ED Discharge Orders     None        Note:  This document was prepared using Dragon voice recognition software and may include unintentional dictation errors.    Lucillie Garfinkel,  MD 03/28/22 1038    Lucillie Garfinkel, MD 03/28/22 [phone]

## 2022-03-28 NOTE — Consult Note (Signed)
[place] for IV Heparin Indication: pulmonary embolus  Patient Measurements: Height: 5\' 10"  (177.8 cm) Weight: 127 kg (279 lb 15.8 oz) IBW/kg (Calculated) : 73 Heparin Dosing Weight: 102 kg  Labs: Recent Labs    03/28/22 0834 03/28/22 1137 03/28/22 1731  HGB 13.6  --   --   HCT 42.4  --   --   PLT 271  --   --   APTT  --  35  --   LABPROT  --  13.9  --   INR  --  1.1  --   HEPARINUNFRC  --   --  0.25*  CREATININE 0.68  --   --   TROPONINIHS 14  --   --     Estimated Creatinine Clearance: 134.7 mL/min (by C-G formula based on SCr of 0.68 mg/dL).  Medical History: Past Medical History:  Diagnosis Date   CAD (coronary artery disease)    a. 04/2015 Cath: LM nl, LAD 10ost/p, RI nl, LCX 10p, OM1/2/3 nl, RCA nl. Nl LV fxn.   Chronic heart failure with preserved ejection fraction (HFpEF) ([place])    a 03/2021 Echo: EF 60-65%, no rwma, mild LVH, nl RV fxn, mild MR, Ao sclerosis.   Depression    Hyperlipidemia    LVH (left ventricular hypertrophy)    Seizure (HCC)    Sleep apnea    SSS (sick sinus syndrome) ([place])    a. 04/2015 s/p BSX DC PPM (ser #[id number]).   Vitamin D deficiency     Medications:  No anticoagulation prior to admission per my chart review. Medication reconciliation is pending  Assessment: 57 y/o M with medical history as above presenting to the ED 2/7 with right leg swelling. Saw PCP day prior to presentation for spider bide to left buttock and referred to ED for possible blood clot. Lower extremity ultrasound negative for DVT. CTA positive for acute PE. Pharmacy consulted to initiate and manage heparin infusion for acute PE.   Baseline aPTT and PT-INR are pending. Baseline CBC within normal limits.  0207 1731 HL 0.25, subthera; 1700 un/hr  Goal of Therapy:  Heparin level 0.3-0.7 units/ml Monitor platelets by anticoagulation protocol: Yes   Plan:  --Heparin level is subtherapeutic --Heparin 1500 unit IV bolus and  increase heparin infusion rate to 1900 units/hr --HL 6 hours from rate change --Daily CBC per protocol while on IV heparin  Benita Gutter 03/28/2022,6:02 PM

## 2022-03-28 NOTE — ED Notes (Signed)
Purewick placed on patient.

## 2022-03-28 NOTE — Consult Note (Addendum)
Pharmacy Antibiotic Note  Seth Tucker is a 57 y.o. male with PMH including HFpEF, CAD, HLD, seizures, sick sinus syndrome s/p pacemaker, OSA admitted on 03/28/2022 with  right leg swelling / cellulitis / acute PE . Pharmacy has been consulted for vancomycin dosing. Patient is also ordered ceftriaxone.  Plan:  Vancomycin 2 g IV LD followed by maintenance regimen of vancomycin 1.5 g IV q12h --Calculated AUC: 521, Cmin 13.8 --Daily Scr per protocol --Levels at steady state or as clinically indicated  Height: 5\' 10"  (177.8 cm) Weight: 127 kg (279 lb 15.8 oz) IBW/kg (Calculated) : 73  Temp (24hrs), Avg:97.7 F (36.5 C), Min:97.6 F (36.4 C), Max:97.8 F (36.6 C)  Recent Labs  Lab 03/28/22 0834 03/28/22 0835  WBC 12.7*  --   CREATININE 0.68  --   LATICACIDVEN  --  1.1    Estimated Creatinine Clearance: 134.7 mL/min (by C-G formula based on SCr of 0.68 mg/dL).    Allergies  Allergen Reactions   Bee Venom Anaphylaxis and Swelling   Hornet Venom Shortness Of Breath and Swelling   Influenza Vaccines Other (See Comments)    Seizures    Peanuts [Peanut Oil] Anaphylaxis   Phenytoin Hives and Rash   Valproic Acid Hives and Rash   Lamotrigine Rash    Antimicrobials this admission: Vancomycin 2/7 >>  Ceftriaxone 2/7 >>  Dose adjustments this admission: N/A  Microbiology results: 2/7 BCx: pending  Thank you for allowing pharmacy to be a part of this patient's care.  Benita Gutter 03/28/2022 2:11 PM

## 2022-03-28 NOTE — Consult Note (Signed)
[place] for IV Heparin Indication: pulmonary embolus  Patient Measurements: Weight: 127 kg (279 lb 15.8 oz) Heparin Dosing Weight: 102 kg  Labs: Recent Labs    03/28/22 0834  HGB 13.6  HCT 42.4  PLT 271  CREATININE 0.68  TROPONINIHS 14   CrCl cannot be calculated (Unknown ideal weight.).  Medical History: Past Medical History:  Diagnosis Date   CAD (coronary artery disease)    a. 04/2015 Cath: LM nl, LAD 10ost/p, RI nl, LCX 10p, OM1/2/3 nl, RCA nl. Nl LV fxn.   Chronic heart failure with preserved ejection fraction (HFpEF) ([place])    a 03/2021 Echo: EF 60-65%, no rwma, mild LVH, nl RV fxn, mild MR, Ao sclerosis.   Depression    Hyperlipidemia    LVH (left ventricular hypertrophy)    Seizure (HCC)    Sleep apnea    SSS (sick sinus syndrome) ([place])    a. 04/2015 s/p BSX DC PPM (ser #[id number]).   Vitamin D deficiency     Medications:  No anticoagulation prior to admission per my chart review. Medication reconciliation is pending  Assessment: 57 y/o M with medical history as above presenting to the ED 2/7 with right leg swelling. Saw PCP day prior to presentation for spider bide to left buttock and referred to ED for possible blood clot. Lower extremity ultrasound negative for DVT. CTA positive for acute PE. Pharmacy consulted to initiate and manage heparin infusion for acute PE.   Baseline aPTT and PT-INR are pending. Baseline CBC within normal limits.  Goal of Therapy:  Heparin level 0.3-0.7 units/ml Monitor platelets by anticoagulation protocol: Yes   Plan:  --Heparin 6000 unit IV bolus followed by continuous infusion at 1700 units/hr --HL 6 hours from initiation of infusion --Daily CBC per protocol while on IV heparin  Benita Gutter 03/28/2022,10:46 AM

## 2022-03-28 NOTE — TOC Benefit Eligibility Note (Signed)
Patient Teacher, English as a foreign language completed.    The patient is currently admitted and upon discharge could be taking Eliquis 5 mg.  The current 30 day co-pay is $4.00.   The patient is currently admitted and upon discharge could be taking Xarelto 20 mg.  The current 30 day co-pay is $4.00.   The patient is insured through [employer], [place] Patient Advocate Specialist [place] Patient Advocate Team Direct Number: [phone]  Fax: [phone]

## 2022-03-28 NOTE — ED Triage Notes (Signed)
C/O right leg swelling x 6 months.  Seen by PCP yesterday for spider bite to left buttock, referred to ED for possible blood clot.  Right leg +3 swelling, firm to touch.  DOE noted, wife states dyspnea worse than normal.

## 2022-03-28 NOTE — H&P (Signed)
History and Physical    Seth Tucker ZOX:[id number] DOB: 15-Apr-1965 DOA: 03/28/2022  Referring MD/NP/PA:   PCP: [facility]   Patient coming from:  The patient is coming from home.    Chief Complaint: SOB  HPI: Seth Tucker is a 57 y.o. male with medical history significant of diastolic CHF, hypertension, hyperlipidemia, CAD, depression, SSS (s/p for pacemaker placement), seizure, morbid obesity obesity BMI 40.17, who presents with shortness breath.  Patient states that he has chronic shortness of breath, which has been progressively worsening.  Patient does not have chest pain, cough. Denies nausea vomiting, diarrhea or abdominal pain.  No symptoms of UTI.  Patient has bilateral leg edema, the right leg is worse than the left.  He also noticed a boil 3 days ago in the left buttock with surrounding induration and tenderness.  His fiance said PCP did I&D.  Patient is currently taking Augmentin.  Denies fever or chills.  No symptoms of UTI.  Denies rectal bleeding or dark stool.  No recent fall or head injury.  Data reviewed independently and ED Course: pt was found to have WBC 12.7, BNP 35.3, electrolytes renal function okay, GFR> 60.  Thank you normal, blood pressure 157/90, heart rate 79, RR 20, oxygen saturation 99% on room air.  Lower extremity venous Dopplers negative for DVT.  CTA is positive for right upper lobe PE without CT evidence of right heart strain.  CTA: 1. Examination is positive for pulmonary embolism within the lobar branch pulmonary artery of the right upper lobe. Small clot burden. No evidence of right heart strain. 2. Lungs are clear. 3. Minimal debris within the distal trachea and right mainstem bronchus. 4. Mildly enlarged mediastinal lymph nodes, nonspecific. 5. Aortic and coronary artery atherosclerosis (ICD10-I70.0).  EKG: Reviewed independently.  Sinus rhythm, QTc 407, early R wave progression, nonspecific T wave change   Review of  Systems:   General: no fevers, chills, has fatigue HEENT: no blurry vision, hearing changes or sore throat Respiratory: has dyspnea, no coughing, wheezing CV: no chest pain, no palpitations GI: no nausea, vomiting, abdominal pain, diarrhea, constipation GU: no dysuria, burning on urination, increased urinary frequency, hematuria  Ext: has leg edema Neuro: no unilateral weakness, numbness, or tingling, no vision change or hearing loss Skin: has infection in left buttock MSK: No muscle spasm, no deformity, no limitation of range of movement in spin Heme: No easy bruising.  Travel history: No recent long distant travel.   Allergy:  Allergies  Allergen Reactions   Bee Venom Anaphylaxis and Swelling   Hornet Venom Shortness Of Breath and Swelling   Influenza Vaccines Other (See Comments)    Seizures    Peanuts [Peanut Oil] Anaphylaxis   Phenytoin Hives and Rash   Valproic Acid Hives and Rash   Lamotrigine Rash    Past Medical History:  Diagnosis Date   CAD (coronary artery disease)    a. 04/2015 Cath: LM nl, LAD 10ost/p, RI nl, LCX 10p, OM1/2/3 nl, RCA nl. Nl LV fxn.   Chronic heart failure with preserved ejection fraction (HFpEF) ([place])    a 03/2021 Echo: EF 60-65%, no rwma, mild LVH, nl RV fxn, mild MR, Ao sclerosis.   Depression    Hyperlipidemia    LVH (left ventricular hypertrophy)    Seizure (HCC)    Sleep apnea    SSS (sick sinus syndrome) ([place])    a. 04/2015 s/p BSX DC PPM (ser #[id number]).   Vitamin D deficiency  Past Surgical History:  Procedure Laterality Date   CARDIAC CATHETERIZATION N/A 05/04/2015   Procedure: Left Heart Cath and Coronary Angiography;  Surgeon: Wellington Hampshire, MD;  Location: [place];  Service: Cardiovascular;  Laterality: N/A;   EP IMPLANTABLE DEVICE N/A 05/04/2015   Procedure: Pacemaker Implant;  Surgeon: Evans Lance, MD;  Location: [place] [facility];  Service: Cardiovascular;  Laterality: N/A;    Social History:  reports  that he has been smoking cigarettes. He has a 10.00 pack-year smoking history. He has never used smokeless tobacco. He reports that he does not drink alcohol and does not use drugs.  Family History:  Family History  Problem Relation Age of Onset   Hypertension Mother    Emphysema Mother    Diabetes Other        sibling   Heart disease Other        sibling   Hypertension Brother      Prior to Admission medications   Medication Sig Start Date End Date Taking? Authorizing Provider  albuterol (PROVENTIL HFA;VENTOLIN HFA) 108 (90 Base) MCG/ACT inhaler Inhale 2 puffs into the lungs every 4 (four) hours as needed for wheezing or shortness of breath.    [provider]  aspirin EC 81 MG EC tablet Take 1 tablet (81 mg total) by mouth daily. 05/05/15   Baldwin Jamaica, PA-C  atorvastatin (LIPITOR) 40 MG tablet Take 40 mg by mouth daily.  02/25/15   [provider]  cephALEXin (KEFLEX) 500 MG capsule Take 1 capsule (500 mg total) by mouth 3 (three) times daily. Patient not taking: Reported on 08/16/2021 05/20/19   Johnn Hai, PA-C  cholecalciferol (VITAMIN D) 1000 units tablet Take 1,000 Units by mouth daily.    [provider]  Fluticasone-Salmeterol (ADVAIR) 250-50 MCG/DOSE AEPB Inhale 1 puff into the lungs 2 (two) times daily.    [provider]  furosemide (LASIX) 40 MG tablet Take 1.5 tablets (60 mg total) by mouth 2 (two) times daily. Patient taking differently: Take 60 mg by mouth as needed. 03/22/21 04/21/21  End, Harrell Gave, MD  LamoTRIgine 50 MG TB24 24 hour tablet Take 4 tablets by mouth daily. Patient not taking: Reported on 08/16/2021 03/06/19   [provider]  nitroGLYCERIN (NITROSTAT) 0.4 MG SL tablet Place 1 tablet (0.4 mg total) under the tongue every 5 (five) minutes as needed for chest pain. 04/28/15   Wende Bushy, MD  omeprazole (PRILOSEC) 20 MG capsule Take 40 mg by mouth daily as needed (for heartburn). Reported on 05/03/2015 Patient  not taking: Reported on 08/16/2021 02/25/15   [provider]  PHENObarbital (LUMINAL) 100 MG tablet Take 200 mg by mouth at bedtime.     [provider]  potassium chloride SA (KLOR-CON M) 20 MEQ tablet Take 1 tablet (20 mEq total) by mouth 2 (two) times daily. 03/22/21 08/16/21  Nelva Bush, MD    Physical Exam: Vitals:   03/28/22 0930 03/28/22 1118 03/28/22 1200 03/28/22 1355  BP: (!) 157/90   (!) 171/91  Pulse: 69   70  Resp: 15   20  Temp:   97.8 F (36.6 C) 97.7 F (36.5 C)  TempSrc:    Oral  SpO2: 99%   98%  Weight:      Height:  5\' 10"  (1.778 m)     General: Not in acute distress HEENT:       Eyes: PERRL, EOMI, no scleral icterus.  ENT: No discharge from the ears and nose, no pharynx injection, no tonsillar enlargement.        Neck: Difficult to assess JVD due to morbid obesity, no bruit, no mass felt. Heme: No neck lymph node enlargement. Cardiac: S1/S2, RRR, No murmurs, No gallops or rubs. Respiratory: No rales, wheezing, rhonchi or rubs. GI: Soft, distended, nontender, no rebound pain, no organomegaly, BS present. GU: No hematuria Ext: 1+DP/PT pulse bilaterally.  Has asymmetric leg edema, right leg is worse than the left.  3+ pitting leg edema in right leg and 2+ in left leg Musculoskeletal: No joint deformities, No joint redness or warmth, no limitation of ROM in spin. Skin: has infection in left buttock, with little serosanguineous drainage, with surrounding induration.   Neuro: Alert, oriented X3, cranial nerves II-XII grossly intact, moves all extremities normally.  Psych: Patient is not psychotic, no suicidal or hemocidal ideation.  Labs on Admission: I have personally reviewed following labs and imaging studies  CBC: Recent Labs  Lab 03/28/22 0834  WBC 12.7*  NEUTROABS 7.7  HGB 13.6  HCT 42.4  MCV 89.6  PLT 271   Basic Metabolic Panel: Recent Labs  Lab 03/28/22 0834  NA 138  K 4.1  CL 106  CO2 23  GLUCOSE 117*  BUN 11   CREATININE 0.68  CALCIUM 9.1   GFR: Estimated Creatinine Clearance: 134.7 mL/min (by C-G formula based on SCr of 0.68 mg/dL). Liver Function Tests: Recent Labs  Lab 03/28/22 0834  AST 18  ALT 17  ALKPHOS 71  BILITOT 0.5  PROT 8.0  ALBUMIN 3.7   No results for input(s): "LIPASE", "AMYLASE" in the last 168 hours. No results for input(s): "AMMONIA" in the last 168 hours. Coagulation Profile: Recent Labs  Lab 03/28/22 1137  INR 1.1   Cardiac Enzymes: No results for input(s): "CKTOTAL", "CKMB", "CKMBINDEX", "TROPONINI" in the last 168 hours. BNP (last 3 results) No results for input(s): "PROBNP" in the last 8760 hours. HbA1C: No results for input(s): "HGBA1C" in the last 72 hours. CBG: No results for input(s): "GLUCAP" in the last 168 hours. Lipid Profile: No results for input(s): "CHOL", "HDL", "LDLCALC", "TRIG", "CHOLHDL", "LDLDIRECT" in the last 72 hours. Thyroid Function Tests: No results for input(s): "TSH", "T4TOTAL", "FREET4", "T3FREE", "THYROIDAB" in the last 72 hours. Anemia Panel: No results for input(s): "VITAMINB12", "FOLATE", "FERRITIN", "TIBC", "IRON", "RETICCTPCT" in the last 72 hours. Urine analysis: No results found for: "COLORURINE", "APPEARANCEUR", "LABSPEC", "PHURINE", "GLUCOSEU", "HGBUR", "BILIRUBINUR", "KETONESUR", "PROTEINUR", "UROBILINOGEN", "NITRITE", "LEUKOCYTESUR" Sepsis Labs: @LABRCNTIP (procalcitonin:4,lacticidven:4) )No results found for this or any previous visit (from the past 240 hour(s)).   Radiological Exams on Admission: Venous Img Lower Unilateral Right  Result Date: 03/28/2022 CLINICAL DATA:  Chronic right lower extremity swelling. EXAM: Right LOWER EXTREMITY VENOUS DOPPLER ULTRASOUND TECHNIQUE: Gray-scale sonography with compression, as well as color and duplex ultrasound, were performed to evaluate the deep venous system(s) from the level of the common femoral vein through the popliteal and proximal calf veins. COMPARISON:  None  Available. FINDINGS: VENOUS Normal compressibility of the common femoral, superficial femoral, and popliteal veins, as well as the visualized calf veins. Visualized portions of profunda femoral vein and great saphenous vein unremarkable. No filling defects to suggest DVT on grayscale or color Doppler imaging. Doppler waveforms show normal direction of venous flow, normal respiratory plasticity and response to augmentation. Limited views of the contralateral common femoral vein are unremarkable. OTHER None. Limitations: none IMPRESSION: Negative. Electronically Signed   By: 05/27/2022  M.D.   On: 03/28/2022 10:27   CT Angio Chest PE W/Cm &/Or Wo Cm  Result Date: 03/28/2022 CLINICAL DATA:  Pulmonary embolism (PE) suspected, high prob EXAM: CT ANGIOGRAPHY CHEST WITH CONTRAST TECHNIQUE: Multidetector CT imaging of the chest was performed using the standard protocol during bolus administration of intravenous contrast. Multiplanar CT image reconstructions and MIPs were obtained to evaluate the vascular anatomy. RADIATION DOSE REDUCTION: This exam was performed according to the departmental dose-optimization program which includes automated exposure control, adjustment of the mA and/or kV according to patient size and/or use of iterative reconstruction technique. CONTRAST:  65mL OMNIPAQUE IOHEXOL 350 MG/ML SOLN COMPARISON:  01/17/2019 FINDINGS: Cardiovascular: Adequate opacification of the pulmonary arteries. There is a filling defect within the lobar branch pulmonary artery of the right upper lobe (series 6, image 159) with extension into a single segmental branch. No additional filling defects are seen. No evidence of right heart strain. Heart size within normal limits. No pericardial effusion. Thoracic aorta is nonaneurysmal. Scattered atherosclerotic vascular calcifications of the aorta and coronary arteries. A left-sided implanted cardiac device is in place. Mediastinum/Nodes: Mildly enlarged subcarinal node  measuring 1.4 cm (series 4, image 69) and lower right paratracheal node measuring 1.0 cm (series 4, image 51). No enlarged axillary or hilar lymph nodes. Minimal debris within the distal trachea and right mainstem bronchus. Esophagus within normal limits. Lungs/Pleura: Lungs are clear. No pleural effusion or pneumothorax. Upper Abdomen: No acute abnormality. Musculoskeletal: No chest wall abnormality. No acute or significant osseous findings. Review of the MIP images confirms the above findings. IMPRESSION: 1. Examination is positive for pulmonary embolism within the lobar branch pulmonary artery of the right upper lobe. Small clot burden. No evidence of right heart strain. 2. Lungs are clear. 3. Minimal debris within the distal trachea and right mainstem bronchus. 4. Mildly enlarged mediastinal lymph nodes, nonspecific. 5. Aortic and coronary artery atherosclerosis (ICD10-I70.0). Critical Value/emergent results were called by telephone at the time of interpretation on 03/28/2022 at 10:05 am to provider [facility] , who verbally acknowledged these results. Electronically Signed   By: Davina Poke D.O.   On: 03/28/2022 10:06   DG Chest Port 1 View  Result Date: 03/28/2022 CLINICAL DATA:  Dyspnea.  Right leg swelling x6 months. EXAM: PORTABLE CHEST 1 VIEW COMPARISON:  02/05/2019. FINDINGS: 0844 hours. Left chest dual-chamber pacemaker with leads projecting over the right atrium and ventricle. Low lung volumes accentuate the pulmonary vasculature and cardiomediastinal silhouette. Otherwise stable heart size and mediastinal contours accounting for patient rotation. Linear opacities in the right lung base, likely atelectasis. No pleural effusion or pneumothorax. IMPRESSION: Low lung volumes with right basilar atelectasis. Electronically Signed   By: Emmit Alexanders M.D.   On: 03/28/2022 08:57      Assessment/Plan Principal Problem:   Acute pulmonary embolism (HCC) Active Problems:   Cellulitis of left  buttock   CAD (coronary artery disease)   Chronic diastolic heart failure (HCC)   Essential hypertension   Hyperlipidemia   Seizure (HCC)   Mediastinal lymphadenopathy   Tobacco abuse   Morbid obesity with BMI of 40.0-44.9, adult ([place])   Assessment and Plan:  Acute pulmonary embolism ([place]): CTA is positive for pulmonary embolism within the lobar branch pulmonary artery of the right upper lobe. Small clot burden. No evidence of right heart strain.  -Will place on tele bed for obs -heparin drip initiated -pain control: When necessary Percocet and morphine -prn albuterol nebs and mucinex   Cellulitis of left buttock: Patient  does not meet criteria for sepsis.  Lactic acid 1.1. -Vancomycin and Rocephin -Blood culture -ESR, CRP  CAD (coronary artery disease) -Aspirin, Lipitor -As needed nitroglycerin  Chronic diastolic heart failure (HCC): 2D echo on 04/14/2021 showed EF of 60 to 65%.  Patient has severe bilateral leg edema.  Lower extremity venous Doppler negative for DVT.  No oxygen desaturation, does not seem to have CHF acute exacerbation. -Give IV Lasix 40 mg x 1 now -Continue home oral Lasix 60 mg daily tomorrow  Essential hypertension -IV hydralazine as needed -On Lasix  Hyperlipidemia -Lipitor  Seizure (HCC) -Seizure precaution -As needed Ativan for seizure -Continue home phenobarbital 200 mg daily  Mediastinal lymphadenopathy: This is incidental finding by CTA. -Needs to follow-up with PCP  Tobacco abuse -Nicotine patch  Morbid obesity with BMI of 40.0-44.9, adult (HCC): Body weight 127 kg, BMI 40.17 -Healthy diet and exercise -Encourage losing weight       DVT ppx: on IV Heparin    Code Status: Full code  Family Communication:es, patient's fianc   at bed side.   Disposition Plan:  Anticipate discharge back to previous environment  Consults called:  none  Admission status and Level of care: Telemetry Medical:    for obs    Dispo: The  patient is from: Home              Anticipated d/c is to: Home              Anticipated d/c date is: 1 day              Patient currently is not medically stable to d/c.    Severity of Illness:  The appropriate patient status for this patient is INPATIENT. Inpatient status is judged to be reasonable and necessary in order to provide the required intensity of service to ensure the patient's safety. The patient's presenting symptoms, physical exam findings, and initial radiographic and laboratory data in the context of their chronic comorbidities is felt to place them at high risk for further clinical deterioration. Furthermore, it is not anticipated that the patient will be medically stable for discharge from the hospital within 2 midnights of admission.   * I certify that at the point of admission it is my clinical judgment that the patient will require inpatient hospital care spanning beyond 2 midnights from the point of admission due to high intensity of service, high risk for further deterioration and high frequency of surveillance required.*       Date of Service 03/28/2022    Lorretta Harp [facility]   If 7PM-7AM, please contact night-coverage [email or web address] 03/28/2022, 6:51 PM

## 2022-03-29 ENCOUNTER — Telehealth: Payer: Self-pay | Admitting: Cardiology

## 2022-03-29 DIAGNOSIS — I5033 Acute on chronic diastolic (congestive) heart failure: Secondary | ICD-10-CM | POA: Diagnosis not present

## 2022-03-29 DIAGNOSIS — L03317 Cellulitis of buttock: Secondary | ICD-10-CM | POA: Diagnosis not present

## 2022-03-29 DIAGNOSIS — L0231 Cutaneous abscess of buttock: Secondary | ICD-10-CM

## 2022-03-29 DIAGNOSIS — Z950 Presence of cardiac pacemaker: Secondary | ICD-10-CM

## 2022-03-29 DIAGNOSIS — I2699 Other pulmonary embolism without acute cor pulmonale: Secondary | ICD-10-CM | POA: Diagnosis not present

## 2022-03-29 LAB — HEPARIN LEVEL (UNFRACTIONATED): Heparin Unfractionated: 0.49 IU/mL (ref 0.30–0.70)

## 2022-03-29 LAB — BASIC METABOLIC PANEL
Anion gap: 10 (ref 5–15)
BUN: 12 mg/dL (ref 6–20)
CO2: 23 mmol/L (ref 22–32)
Calcium: 8.9 mg/dL (ref 8.9–10.3)
Chloride: 103 mmol/L (ref 98–111)
Creatinine, Ser: 0.66 mg/dL (ref 0.61–1.24)
GFR, Estimated: >60 mL/min (ref >60)
Glucose, Bld: 96 mg/dL (ref 70–99)
Potassium: 4.2 mmol/L (ref 3.5–5.1)
Sodium: 136 mmol/L (ref 135–145)

## 2022-03-29 LAB — CBC
HCT: 41 % (ref 39.0–52.0)
Hemoglobin: 13.3 g/dL (ref 13.0–17.0)
MCH: 28.4 pg (ref 26.0–34.0)
MCHC: 32.4 g/dL (ref 30.0–36.0)
MCV: 87.4 fL (ref 80.0–100.0)
Platelets: 270 10*3/uL (ref 150–400)
RBC: 4.69 MIL/uL (ref 4.22–5.81)
RDW: 13.5 % (ref 11.5–15.5)
WBC: 11.7 10*3/uL — ABNORMAL HIGH (ref 4.0–10.5)
nRBC: 0 % (ref 0.0–0.2)

## 2022-03-29 MED ORDER — OXYCODONE-ACETAMINOPHEN 5-325 MG PO TABS: 1.0000 | ORAL_TABLET | Freq: Four times a day (QID) | ORAL | 0 refills | Status: DC | PRN

## 2022-03-29 MED ORDER — CARVEDILOL 6.25 MG PO TABS: 6.2500 mg | ORAL_TABLET | Freq: Two times a day (BID) | ORAL | 0 refills | Status: DC

## 2022-03-29 MED ORDER — LOSARTAN POTASSIUM 50 MG PO TABS: 50.0000 mg | ORAL_TABLET | Freq: Every day | ORAL | Status: DC

## 2022-03-29 MED ORDER — APIXABAN (ELIQUIS) VTE STARTER PACK (10MG AND 5MG): ORAL_TABLET | ORAL | 0 refills | Status: DC

## 2022-03-29 MED ORDER — FUROSEMIDE 10 MG/ML IJ SOLN: 40.0000 mg | Freq: Two times a day (BID) | INTRAMUSCULAR | Status: DC

## 2022-03-29 MED ORDER — CARVEDILOL 3.125 MG PO TABS: 6.2500 mg | ORAL_TABLET | Freq: Two times a day (BID) | ORAL | Status: DC

## 2022-03-29 MED ORDER — DOXYCYCLINE MONOHYDRATE 100 MG PO TABS: 100.0000 mg | ORAL_TABLET | Freq: Two times a day (BID) | ORAL | 0 refills | Status: AC

## 2022-03-29 MED ORDER — APIXABAN 5 MG PO TABS: 5.0000 mg | ORAL_TABLET | Freq: Two times a day (BID) | ORAL | Status: DC

## 2022-03-29 MED ORDER — LOSARTAN POTASSIUM 50 MG PO TABS: 50.0000 mg | ORAL_TABLET | Freq: Every day | ORAL | 0 refills | Status: DC

## 2022-03-29 MED ORDER — APIXABAN 5 MG PO TABS
10.0000 mg | ORAL_TABLET | Freq: Two times a day (BID) | ORAL | Status: DC
  Administered 2022-03-29: 10 mg via ORAL
  Filled 2022-03-29: qty 2

## 2022-03-29 NOTE — Consult Note (Signed)
Cardiology Consultation   Patient ID: Seth Tucker MRN: [id number]; DOB: Nov 01, 1965  Admit date: 03/28/2022 Date of Consult: 03/29/2022  PCP:  [facility] [place] Providers Cardiologist: [email or web address] Physician requesting consult: dR. [email or web address] Reason for consult: Abnormal pacemaker, leg swelling, PE  Patient Profile:   Seth Tucker is a 57 y.o. male with a hx of chronic leg swelling worse on the right than the left, sick sinus syndrome status post dual-chamber pacemaker, nonobstructive coronary disease, morbid obesity, hypertension, hyperlipidemia, sleep apnea noncompliant with CPAP who presents after referral from primary care at [facility] clinic for emergency room evaluation for leg swelling  History of Present Illness:   Seth Tucker was recently seen in the cardiology clinic March 22, 2021 at which visit he reported shortness of breath, worsening leg swelling.  Recommended to take Lasix 60 twice daily with potassium 20 twice daily.  No recent pacer download, it was recommended he establish in the [place] office with EP.  Since feb 2023 he has had 5 canceled office visits or no-shows for follow-up.  Marland Kitchen  He presented to the emergency room March 28, 2021 with worsening swelling right lower extremity, spider bite and cellulitis to buttock, increasing shortness of breath more than normal. He reports he is not taking Lasix on a regular basis, takes this as needed. Lives alone, difficulty taking care of himself. Reports only able to walk a short distance before having to sit secondary to shortness of breath.  On arrival to the emergency room was hypertensive blood pressure 180/79 Normal renal function, BNP 35 Chest CTA positive for small right upper lobe PE with no evidence of right heart strain No DVT in lower extremities  Past Medical History:  Diagnosis Date   CAD (coronary artery disease)    a. 04/2015 Cath: LM nl, LAD 10ost/p, RI nl, LCX 10p, OM1/2/3  nl, RCA nl. Nl LV fxn.   Chronic heart failure with preserved ejection fraction (HFpEF) ([place])    a 03/2021 Echo: EF 60-65%, no rwma, mild LVH, nl RV fxn, mild MR, Ao sclerosis.   Depression    Hyperlipidemia    LVH (left ventricular hypertrophy)    Seizure (HCC)    Sleep apnea    SSS (sick sinus syndrome) ([place])    a. 04/2015 s/p BSX DC PPM (ser #[id number]).   Vitamin D deficiency     Past Surgical History:  Procedure Laterality Date   CARDIAC CATHETERIZATION N/A 05/04/2015   Procedure: Left Heart Cath and Coronary Angiography;  Surgeon: Wellington Hampshire, MD;  Location: [place] [facility];  Service: Cardiovascular;  Laterality: N/A;   EP IMPLANTABLE DEVICE N/A 05/04/2015   Procedure: Pacemaker Implant;  Surgeon: Evans Lance, MD;  Location: [place] [facility];  Service: Cardiovascular;  Laterality: N/A;     Home Medications:  Prior to Admission medications   Medication Sig Start Date End Date Taking? Authorizing Provider  albuterol (PROVENTIL HFA;VENTOLIN HFA) 108 (90 Base) MCG/ACT inhaler Inhale 2 puffs into the lungs every 4 (four) hours as needed for wheezing or shortness of breath.   Yes [provider]  amoxicillin-clavulanate (AUGMENTIN) 875-125 MG tablet Take 1 tablet by mouth 2 (two) times daily. 03/27/22  Yes [provider]  aspirin EC 81 MG EC tablet Take 1 tablet (81 mg total) by mouth daily. 05/05/15  Yes Baldwin Jamaica, PA-C  cholecalciferol (VITAMIN D) 1000 units tablet Take 1,000 Units by mouth daily.   Yes [provider]  Fluticasone-Salmeterol (ADVAIR) 250-50 MCG/DOSE AEPB Inhale 1 puff into the lungs 2 (two) times daily.   Yes [provider]  furosemide (LASIX) 40 MG tablet Take 1.5 tablets (60 mg total) by mouth 2 (two) times daily. Patient taking differently: Take 60 mg by mouth as needed for fluid or edema. 03/22/21 03/28/22 Yes End, Cristal Deer, MD  PHENObarbital (LUMINAL) 100 MG tablet Take 200 mg by mouth at bedtime.    Yes [provider]  potassium chloride SA (KLOR-CON M) 20 MEQ tablet Take 1 tablet (20 mEq total) by mouth 2 (two) times daily. 03/22/21 03/28/22 Yes End, Cristal Deer, MD  atorvastatin (LIPITOR) 40 MG tablet Take 40 mg by mouth daily.  Patient not taking: Reported on 03/28/2022 02/25/15   [provider]  cephALEXin (KEFLEX) 500 MG capsule Take 1 capsule (500 mg total) by mouth 3 (three) times daily. Patient not taking: Reported on 08/16/2021 05/20/19   Tommi Rumps, PA-C  LamoTRIgine 50 MG TB24 24 hour tablet Take 4 tablets by mouth daily. Patient not taking: Reported on 08/16/2021 03/06/19   [provider]  nitroGLYCERIN (NITROSTAT) 0.4 MG SL tablet Place 1 tablet (0.4 mg total) under the tongue every 5 (five) minutes as needed for chest pain. Patient not taking: Reported on 03/28/2022 04/28/15   Almond Lint, MD  omeprazole (PRILOSEC) 20 MG capsule Take 40 mg by mouth daily as needed (for heartburn). Reported on 05/03/2015 Patient not taking: Reported on 08/16/2021 02/25/15   [provider]   Inpatient Medications: Scheduled Meds:  apixaban  10 mg Oral BID   Followed by   Melene Muller ON 04/05/2022] apixaban  5 mg Oral BID   aspirin EC  81 mg Oral Daily   atorvastatin  40 mg Oral Daily   cholecalciferol  1,000 Units Oral Daily   furosemide  40 mg Intravenous BID   mometasone-formoterol  2 puff Inhalation BID   nicotine  21 mg Transdermal Daily   PHENobarbital  194.4 mg Oral QHS   Continuous Infusions:  cefTRIAXone (ROCEPHIN)  IV 2 g (03/28/22 1619)   vancomycin 1,500 mg (03/29/22 0929)   PRN Meds: albuterol, dextromethorphan-guaiFENesin, LORazepam, morphine injection, nitroGLYCERIN, oxyCODONE-acetaminophen  Allergies:    Allergies  Allergen Reactions   Bee Venom Anaphylaxis and Swelling   Hornet Venom Shortness Of Breath and Swelling   Influenza Vaccines Other (See Comments)    Seizures    Peanuts [Peanut Oil] Anaphylaxis   Phenytoin Hives and Rash   Valproic Acid  Hives and Rash   Lamotrigine Rash    Social History:   Social History   Socioeconomic History   Marital status: Single    Spouse name: Not on file   Number of children: Not on file   Years of education: Not on file   Highest education level: Not on file  Occupational History   Not on file  Tobacco Use   Smoking status: Every Day    Packs/day: 0.50    Years: 20.00    Total pack years: 10.00    Types: Cigarettes   Smokeless tobacco: Never   Tobacco comments:    6 a day.  Vaping Use   Vaping Use: Never used  Substance and Sexual Activity   Alcohol use: No   Drug use: No   Sexual activity: Not Currently  Other Topics Concern   Not on file  Social History Narrative   Not on file   Social Determinants of Health   Financial Resource Strain: Not  on file  Food Insecurity: No Food Insecurity (03/28/2022)   Hunger Vital Sign    Worried About Running Out of Food in the Last Year: Never true    Ran Out of Food in the Last Year: Never true  Transportation Needs: No Transportation Needs (03/28/2022)   PRAPARE - Administrator, Civil Service (Medical): No    Lack of Transportation (Non-Medical): No  Physical Activity: Not on file  Stress: Not on file  Social Connections: Not on file  Intimate Partner Violence: Not At Risk (03/28/2022)   Humiliation, Afraid, Rape, and Kick questionnaire    Fear of Current or Ex-Partner: No    Emotionally Abused: No    Physically Abused: No    Sexually Abused: No    Family History:    Family History  Problem Relation Age of Onset   Hypertension Mother    Emphysema Mother    Diabetes Other        sibling   Heart disease Other        sibling   Hypertension Brother      ROS:  Please see the history of present illness.  Review of Systems  Constitutional: Negative.   HENT: Negative.    Respiratory:  Positive for shortness of breath.   Cardiovascular:  Positive for leg swelling.  Gastrointestinal: Negative.   Musculoskeletal:  Negative.   Neurological: Negative.   Psychiatric/Behavioral: Negative.    All other systems reviewed and are negative.   All other ROS reviewed and negative.     Physical Exam/Data:   Vitals:   03/28/22 2342 03/29/22 0428 03/29/22 0600 03/29/22 0816  BP: (!) 146/80  (!) 154/84 (!) 165/76  Pulse: 67  (!) 114 83  Resp: 18  (!) 22 20  Temp: 98.4 F (36.9 C)  98.2 F (36.8 C) 97.9 F (36.6 C)  TempSrc:   Oral Oral  SpO2: 97%  98% 98%  Weight:  (!) 139.3 kg    Height:        Intake/Output Summary (Last 24 hours) at 03/29/2022 0956 Last data filed at 03/29/2022 1884 Gross per 24 hour  Intake 1060.04 ml  Output 2500 ml  Net -1439.96 ml      03/29/2022    4:28 AM 03/28/2022    8:30 AM 08/16/2021    1:53 PM  Last 3 Weights  Weight (lbs) 307 lb 1.6 oz 279 lb 15.8 oz 280 lb  Weight (kg) 139.3 kg 127 kg 127.007 kg     Body mass index is 44.06 kg/m.  General:  Well nourished, well developed, in no acute distress HEENT: normal Neck: no JVD Vascular: No carotid bruits; Distal pulses 2+ bilaterally Cardiac:  normal S1, S2; RRR; no murmur , 3+ edema right lower extremity below the knee, 1+ on the left Lungs:  clear to auscultation bilaterally, no wheezing, rhonchi or rales  Abd: soft, nontender, no hepatomegaly  Ext: no edema Musculoskeletal:  No deformities, BUE and BLE strength normal and equal Skin: warm and dry  Neuro:  CNs 2-12 intact, no focal abnormalities noted Psych:  Normal affect   EKG:  The EKG was personally reviewed and demonstrates:   Normal sinus rhythm rate 73 bpm no significant ST or T wave changes Telemetry:  Telemetry was personally reviewed and demonstrates:   NSR  Relevant CV Studies: Echo pending Prior echocardiogram fibber 2023 with normal ejection fraction  Laboratory Data:  High Sensitivity Troponin:   Recent Labs  Lab 03/28/22 0834  TROPONINIHS  14     Chemistry Recent Labs  Lab 03/28/22 0834 03/29/22 0100  NA 138 136  K 4.1 4.2  CL 106  103  CO2 23 23  GLUCOSE 117* 96  BUN 11 12  CREATININE 0.68 0.66  CALCIUM 9.1 8.9  GFRNONAA >60 >60  ANIONGAP 9 10    Recent Labs  Lab 03/28/22 0834  PROT 8.0  ALBUMIN 3.7  AST 18  ALT 17  ALKPHOS 71  BILITOT 0.5   Lipids No results for input(s): "CHOL", "TRIG", "HDL", "LABVLDL", "LDLCALC", "CHOLHDL" in the last 168 hours.  Hematology Recent Labs  Lab 03/28/22 0834 03/29/22 0100  WBC 12.7* 11.7*  RBC 4.73 4.69  HGB 13.6 13.3  HCT 42.4 41.0  MCV 89.6 87.4  MCH 28.8 28.4  MCHC 32.1 32.4  RDW 13.4 13.5  PLT 271 270   Thyroid No results for input(s): "TSH", "FREET4" in the last 168 hours.  BNP Recent Labs  Lab 03/28/22 0834  BNP 35.3    DDimer No results for input(s): "DDIMER" in the last 168 hours.   Radiology/Studies:  US Venous Img Lower Unilateral Right  Result Date: 03/28/2022 CLINICAL DATA:  Chronic right lower extremity swelling. EXAM: Right LOWER EXTREMITY VENOUS DOPPLER ULTRASOUND TECHNIQUE: Gray-scale sonography with compression, as well as color and duplex ultrasound, were performed to evaluate the deep venous system(s) from the level of the common femoral vein through the popliteal and proximal calf veins. COMPARISON:  None Available. FINDINGS: VENOUS Normal compressibility of the common femoral, superficial femoral, and popliteal veins, as well as the visualized calf veins. Visualized portions of profunda femoral vein and great saphenous vein unremarkable. No filling defects to suggest DVT on grayscale or color Doppler imaging. Doppler waveforms show normal direction of venous flow, normal respiratory plasticity and response to augmentation. Limited views of the contralateral common femoral vein are unremarkable. OTHER None. Limitations: none IMPRESSION: Negative. Electronically Signed   By: Marijo Conception M.D.   On: 03/28/2022 10:27   CT Angio Chest PE W/Cm &/Or Wo Cm  Result Date: 03/28/2022 CLINICAL DATA:  Pulmonary embolism (PE) suspected, high prob  EXAM: CT ANGIOGRAPHY CHEST WITH CONTRAST TECHNIQUE: Multidetector CT imaging of the chest was performed using the standard protocol during bolus administration of intravenous contrast. Multiplanar CT image reconstructions and MIPs were obtained to evaluate the vascular anatomy. RADIATION DOSE REDUCTION: This exam was performed according to the departmental dose-optimization program which includes automated exposure control, adjustment of the mA and/or kV according to patient size and/or use of iterative reconstruction technique. CONTRAST:  52mL OMNIPAQUE IOHEXOL 350 MG/ML SOLN COMPARISON:  01/17/2019 FINDINGS: Cardiovascular: Adequate opacification of the pulmonary arteries. There is a filling defect within the lobar branch pulmonary artery of the right upper lobe (series 6, image 159) with extension into a single segmental branch. No additional filling defects are seen. No evidence of right heart strain. Heart size within normal limits. No pericardial effusion. Thoracic aorta is nonaneurysmal. Scattered atherosclerotic vascular calcifications of the aorta and coronary arteries. A left-sided implanted cardiac device is in place. Mediastinum/Nodes: Mildly enlarged subcarinal node measuring 1.4 cm (series 4, image 69) and lower right paratracheal node measuring 1.0 cm (series 4, image 51). No enlarged axillary or hilar lymph nodes. Minimal debris within the distal trachea and right mainstem bronchus. Esophagus within normal limits. Lungs/Pleura: Lungs are clear. No pleural effusion or pneumothorax. Upper Abdomen: No acute abnormality. Musculoskeletal: No chest wall abnormality. No acute or significant osseous findings. Review of the MIP  images confirms the above findings. IMPRESSION: 1. Examination is positive for pulmonary embolism within the lobar branch pulmonary artery of the right upper lobe. Small clot burden. No evidence of right heart strain. 2. Lungs are clear. 3. Minimal debris within the distal trachea and  right mainstem bronchus. 4. Mildly enlarged mediastinal lymph nodes, nonspecific. 5. Aortic and coronary artery atherosclerosis (ICD10-I70.0). Critical Value/emergent results were called by telephone at the time of interpretation on 03/28/2022 at 10:05 am to provider [facility] , who verbally acknowledged these results. Electronically Signed   By: Davina Poke D.O.   On: 03/28/2022 10:06   DG Chest Port 1 View  Result Date: 03/28/2022 CLINICAL DATA:  Dyspnea.  Right leg swelling x6 months. EXAM: PORTABLE CHEST 1 VIEW COMPARISON:  02/05/2019. FINDINGS: 0844 hours. Left chest dual-chamber pacemaker with leads projecting over the right atrium and ventricle. Low lung volumes accentuate the pulmonary vasculature and cardiomediastinal silhouette. Otherwise stable heart size and mediastinal contours accounting for patient rotation. Linear opacities in the right lung base, likely atelectasis. No pleural effusion or pneumothorax. IMPRESSION: Low lung volumes with right basilar atelectasis. Electronically Signed   By: Emmit Alexanders M.D.   On: 03/28/2022 08:57     Assessment and Plan:   Acute on chronic diastolic CHF Reports worsening shortness of breath over the past 6 months Admits to noncompliance with his Lasix. Previously seen by Dr. Saunders Revel at which appointment February 2023 was recommended he take Lasix 60 twice daily.  He reports he has not been taking Lasix on a regular basis, "makes me pee" On clinical exam, bilateral lower extremity edema right out of proportion to CHF symptoms, mild abdominal distention, very short of breath on exertion concerning for CHF -Blood pressure elevated, likely component of hypertensive heart disease -- Will recommend we start Lasix 40 IV twice daily --repeat echo pending -Will start carvedilol 6.25 twice daily, losartan 50, patient follow-up of blood pressure.  Consider addition of Jardiance at discharge  2.  Pulmonary embolism Noted on chest CTA, small, no heart  strain Echocardiogram pending Given sedentary lifestyle, leg swelling, high risk of recurrent DVT and PE On Eliquis twice daily  3.  Buttock cellulitis/boil On antibiotics, will defer management to hospitalist service Would likely benefit from home nursing  4.  Pacemaker, dual-chamber Appears to be functioning appropriately, significant artifact on telemetry Leads changed Will help facilitate outpatient follow-up in EP clinic Was no-show or canceled several prior office visits with Dr. Quentin Ore in [place] -We will discuss with him, unclear if transportation is an issue   Total encounter time more than 80 minutes  Greater than 50% was spent in counseling and coordination of care with the patient   For questions or updates, please contact Todd Please consult [email or web address] for contact info under    Signed, Ida Rogue, MD  03/29/2022 9:56 AM

## 2022-03-29 NOTE — TOC Progression Note (Signed)
Transition of Care [facility]) - Progression Note    Patient Details  Name: Seth Tucker MRN: [id number] Date of Birth: March 23, 1965  Transition of Care [facility]) CM/SW Luis Llorens Torres, RN Phone Number: 03/29/2022, 10:03 AM  Clinical Narrative:     Saw PCP yesterday prior to hospital admission for spider bite to left buttock and  was referred to ED for possible blood clot. Lower extremity ultrasound negative for DVT. CTA positive for acute PE Pharmacy has priced his medication and provided to the patient, he is from home with his wife, has Medicaid in place No identified TOC needs at this time  Transition of Care [facility]) Screening Note   Patient Details  Name: Seth Tucker Date of Birth: April 01, 1965   Transition of Care [facility]) CM/SW Contact:    Conception Oms, RN Phone Number: 03/29/2022, 10:04 AM    Transition of Care Department [facility]) has reviewed patient and no TOC needs have been identified at this time. We will continue to monitor patient advancement through interdisciplinary progression rounds. If new patient transition needs arise, please place a TOC consult.     Expected Discharge Plan: Home/Self Care Barriers to Discharge: Continued Medical Work up  Expected Discharge Plan and Services   Discharge Planning Services: CM Consult   Living arrangements for the past 2 months: Single Family Home                                       Social Determinants of Health (SDOH) Interventions SDOH Screenings   Food Insecurity: No Food Insecurity (03/28/2022)  Housing: Low Risk  (03/28/2022)  Transportation Needs: No Transportation Needs (03/28/2022)  Utilities: Not At Risk (03/28/2022)  Tobacco Use: High Risk (03/28/2022)    Readmission Risk Interventions     No data to display

## 2022-03-29 NOTE — Telephone Encounter (Signed)
LVM for patient to schedule overdue pacer check, either with Dr. Quentin Ore or Caryl Comes

## 2022-03-29 NOTE — Progress Notes (Signed)
Patient discharging home. Instructions given to patient, verbalized understanding. Doctors note given to friend, Arrie Aran. Dressing changed on left buttocks. Dawn transporting patient home.

## 2022-03-29 NOTE — Consult Note (Signed)
[place] for IV Heparin Indication: pulmonary embolus  Patient Measurements: Height: 5\' 10"  (177.8 cm) Weight: 127 kg (279 lb 15.8 oz) IBW/kg (Calculated) : 73 Heparin Dosing Weight: 102 kg  Labs: Recent Labs    03/28/22 0834 03/28/22 1137 03/28/22 1731 03/29/22 0100  HGB 13.6  --   --  13.3  HCT 42.4  --   --  41.0  PLT 271  --   --  270  APTT  --  35  --   --   LABPROT  --  13.9  --   --   INR  --  1.1  --   --   HEPARINUNFRC  --   --  0.25* 0.49  CREATININE 0.68  --   --  0.66  TROPONINIHS 14  --   --   --     Estimated Creatinine Clearance: 134.7 mL/min (by C-G formula based on SCr of 0.66 mg/dL).  Medical History: Past Medical History:  Diagnosis Date   CAD (coronary artery disease)    a. 04/2015 Cath: LM nl, LAD 10ost/p, RI nl, LCX 10p, OM1/2/3 nl, RCA nl. Nl LV fxn.   Chronic heart failure with preserved ejection fraction (HFpEF) ([place])    a 03/2021 Echo: EF 60-65%, no rwma, mild LVH, nl RV fxn, mild MR, Ao sclerosis.   Depression    Hyperlipidemia    LVH (left ventricular hypertrophy)    Seizure (HCC)    Sleep apnea    SSS (sick sinus syndrome) ([place])    a. 04/2015 s/p BSX DC PPM (ser #[id number]).   Vitamin D deficiency     Medications:  No anticoagulation prior to admission per my chart review. Medication reconciliation is pending  Assessment: 57 y/o M with medical history as above presenting to the ED 2/7 with right leg swelling. Saw PCP day prior to presentation for spider bide to left buttock and referred to ED for possible blood clot. Lower extremity ultrasound negative for DVT. CTA positive for acute PE. Pharmacy consulted to initiate and manage heparin infusion for acute PE.   Baseline aPTT and PT-INR are pending. Baseline CBC within normal limits.  0207 1731 HL 0.25, subthera; 1700 un/hr 0208 0100 HL 0.49, therapeutic x 1  Goal of Therapy:  Heparin level 0.3-0.7 units/ml Monitor platelets by anticoagulation  protocol: Yes   Plan:  --Continue heparin infusion rate at 1900 units/hr --Recheck HL 6 hours to confirm  --Daily CBC per protocol while on IV heparin  Renda Rolls, PharmD, [facility] 03/29/2022 1:37 AM

## 2022-03-29 NOTE — Discharge Summary (Signed)
Physician Discharge Summary   Patient: Seth Tucker MRN: [id number] DOB: [birth year]  Admit date:     03/28/2022  Discharge date: 03/29/22  Discharge Physician: Sharen Hones   PCP: [facility]   Recommendations at discharge:   Follow-up with PCP in 1 week. Refer to pulmonology for BiPAP/trilogy assessment. Follow-up with cardiology, Dr. Donivan Scull office will call to schedule.  Discharge Diagnoses: Principal Problem:   Acute pulmonary embolism (HCC) Active Problems:   Cellulitis and abscess of buttock   CAD (coronary artery disease)   Acute on chronic diastolic CHF (congestive heart failure) (HCC)   Essential hypertension   Hyperlipidemia   Seizure (HCC)   Mediastinal lymphadenopathy   Tobacco abuse   Morbid obesity with BMI of 40.0-44.9, adult (HCC)   Leg edema   Pacemaker  Resolved Problems:   * No resolved hospital problems. *  Hospital Course: Seth Tucker is a 57 y.o. male with medical history significant of diastolic CHF, hypertension, hyperlipidemia, CAD, depression, SSS (s/p for pacemaker placement), seizure, morbid obesity obesity BMI 40.17, who presents with shortness breath.  CT angiogram of the chest showed small right upper lobe PE, duplex ultrasound of lower extremities negative for DVT.  Patient was placed on heparin drip.  Patient was also treated with vancomycin and Rocephin for buttock cellulitis.  Condition is pretty stable, patient had a recent I&D in his PCP office, did not see any culture.  Will continue antibiotics with the previously prescribed Augmentin, added doxycycline to cover any possibility of MRSA.  Assessment and Plan:  Acute pulmonary embolism [facility]):  Patient has a small PE, since this is the first occurrence, will treat for 3 months.    Cellulitis of left buttock: Patient does not meet criteria for sepsis.  Lactic acid 1.1. Vancomycin and Rocephin.  Condition stable, will continue antibiotic with Augmentin and  doxycycline, follow-up with PCP as outpatient.   CAD (coronary artery disease  Chronic diastolic heart failure ([place]) Status post pacemaker. : 2D echo on 04/14/2021 showed EF of 60 to 65%.  Patient has severe bilateral leg edema.  Lower extremity venous Doppler negative for DVT.   Patient does not appear to exacerbation congestive heart failure.  Patient has been seen by Dr. Rockey Situ, I spoke with him today, telemetry strips reflect artifacts.  However, patient has not been seen by cardiology since patient records placed, Dr. Rockey Situ office will schedule outpatient follow-up.  Bilateral leg edema, most likely venous stasis. Obstructive sleep apnea. Patient has history of obstructive sleep apnea, but not able to tolerate the CPAP.  Need referral to pulmonology for workup for a trilogy/BiPAP.  Patient currently does not have hypoxemia, no urgent need for trilogy. Patient also could have a pulmonary hypertension which could be the source of his severe bilateral leg edema.  Received IV Lasix, will resume home dose as needed Lasix.   Essential hypertension Hyperlipidemia Resume home treatment.   Seizure ([place]) -Continue home phenobarbital 200 mg daily   Mediastinal lymphadenopathy: This is incidental finding by CTA. -Needs to follow-up with PCP   Tobacco abuse -Nicotine patch, advised to quit.   Morbid obesity with BMI of 40.0-44.9, adult [facility]): Body weight 127 kg, BMI 40.17 -Healthy diet and exercise -Encourage losing weight       Consultants: Cardiology Procedures performed: None  Disposition: Home Diet recommendation:  Discharge Diet Orders (From admission, onward)     Start     Ordered   03/29/22 0000  Diet - low sodium heart healthy  03/29/22 1019           Cardiac diet DISCHARGE MEDICATION: Allergies as of 03/29/2022       Reactions   Bee Venom Anaphylaxis, Swelling   Hornet Venom Shortness Of Breath, Swelling   Influenza Vaccines Other (See Comments)    Seizures   Peanuts [peanut Oil] Anaphylaxis   Phenytoin Hives, Rash   Valproic Acid Hives, Rash   Lamotrigine Rash        Medication List     STOP taking these medications    cephALEXin 500 MG capsule Commonly known as: KEFLEX   lamoTRIgine 50 MG 24 hour tablet Commonly known as: LAMICTAL XR   potassium chloride SA 20 MEQ tablet Commonly known as: KLOR-CON M   PriLOSEC 20 MG capsule Generic drug: omeprazole       TAKE these medications    albuterol 108 (90 Base) MCG/ACT inhaler Commonly known as: VENTOLIN HFA Inhale 2 puffs into the lungs every 4 (four) hours as needed for wheezing or shortness of breath.   amoxicillin-clavulanate 875-125 MG tablet Commonly known as: AUGMENTIN Take 1 tablet by mouth 2 (two) times daily.   Apixaban Starter Pack (10mg  and 5mg ) Commonly known as: ELIQUIS STARTER PACK Take as directed on package: start with two-5mg  tablets twice daily for 7 days. On day 8, switch to one-5mg  tablet twice daily.   aspirin EC 81 MG tablet Take 1 tablet (81 mg total) by mouth daily.   atorvastatin 40 MG tablet Commonly known as: LIPITOR Take 40 mg by mouth daily.   carvedilol 6.25 MG tablet Commonly known as: COREG Take 1 tablet (6.25 mg total) by mouth 2 (two) times daily with a meal.   cholecalciferol 1000 units tablet Commonly known as: VITAMIN D Take 1,000 Units by mouth daily.   doxycycline 100 MG tablet Commonly known as: ADOXA Take 1 tablet (100 mg total) by mouth 2 (two) times daily for 5 days.   Fluticasone-Salmeterol 250-50 MCG/DOSE Aepb Commonly known as: ADVAIR Inhale 1 puff into the lungs 2 (two) times daily.   furosemide 40 MG tablet Commonly known as: LASIX Take 1.5 tablets (60 mg total) by mouth 2 (two) times daily. What changed:  when to take this reasons to take this   losartan 50 MG tablet Commonly known as: COZAAR Take 1 tablet (50 mg total) by mouth daily.   nitroGLYCERIN 0.4 MG SL tablet Commonly known as:  NITROSTAT Place 1 tablet (0.4 mg total) under the tongue every 5 (five) minutes as needed for chest pain.   oxyCODONE-acetaminophen 5-325 MG tablet Commonly known as: PERCOCET/ROXICET Take 1 tablet by mouth every 6 (six) hours as needed for moderate pain.   PHENObarbital 100 MG tablet Commonly known as: LUMINAL Take 200 mg by mouth at bedtime.        Follow-up [place], [facility] Follow up in 1 week(s).   Specialty: General Practice Contact information: [place] [address] [phone]                Discharge Exam: Danley Danker Weights   03/28/22 0830 03/29/22 0428  Weight: 127 kg (!) 139.3 kg   General exam: Appears calm and comfortable, morbidly obese. Respiratory system: Clear to auscultation. Respiratory effort normal. Cardiovascular system: S1 & S2 heard, RRR. No JVD, murmurs, rubs, gallops or clicks.  Gastrointestinal system: Abdomen is nondistended, soft and nontender. No organomegaly or masses felt. Normal bowel sounds heard. Central nervous system: Alert and oriented.  No focal neurological deficits. Extremities: 3+ leg edema Skin: No rashes, lesions or ulcers Psychiatry: Judgement and insight appear normal. Mood & affect appropriate.  Buttock has some induration, small opening from prior I&D.  No significant draining.  Condition at discharge: good  The results of significant diagnostics from this hospitalization (including imaging, microbiology, ancillary and laboratory) are listed below for reference.   Imaging Studies: US Venous Img Lower Unilateral Right  Result Date: 03/28/2022 CLINICAL DATA:  Chronic right lower extremity swelling. EXAM: Right LOWER EXTREMITY VENOUS DOPPLER ULTRASOUND TECHNIQUE: Gray-scale sonography with compression, as well as color and duplex ultrasound, were performed to evaluate the deep venous system(s) from the level of the common femoral vein through the popliteal and proximal calf  veins. COMPARISON:  None Available. FINDINGS: VENOUS Normal compressibility of the common femoral, superficial femoral, and popliteal veins, as well as the visualized calf veins. Visualized portions of profunda femoral vein and great saphenous vein unremarkable. No filling defects to suggest DVT on grayscale or color Doppler imaging. Doppler waveforms show normal direction of venous flow, normal respiratory plasticity and response to augmentation. Limited views of the contralateral common femoral vein are unremarkable. OTHER None. Limitations: none IMPRESSION: Negative. Electronically Signed   By: Marijo Conception M.D.   On: 03/28/2022 10:27   CT Angio Chest PE W/Cm &/Or Wo Cm  Result Date: 03/28/2022 CLINICAL DATA:  Pulmonary embolism (PE) suspected, high prob EXAM: CT ANGIOGRAPHY CHEST WITH CONTRAST TECHNIQUE: Multidetector CT imaging of the chest was performed using the standard protocol during bolus administration of intravenous contrast. Multiplanar CT image reconstructions and MIPs were obtained to evaluate the vascular anatomy. RADIATION DOSE REDUCTION: This exam was performed according to the departmental dose-optimization program which includes automated exposure control, adjustment of the mA and/or kV according to patient size and/or use of iterative reconstruction technique. CONTRAST:  55mL OMNIPAQUE IOHEXOL 350 MG/ML SOLN COMPARISON:  01/17/2019 FINDINGS: Cardiovascular: Adequate opacification of the pulmonary arteries. There is a filling defect within the lobar branch pulmonary artery of the right upper lobe (series 6, image 159) with extension into a single segmental branch. No additional filling defects are seen. No evidence of right heart strain. Heart size within normal limits. No pericardial effusion. Thoracic aorta is nonaneurysmal. Scattered atherosclerotic vascular calcifications of the aorta and coronary arteries. A left-sided implanted cardiac device is in place. Mediastinum/Nodes: Mildly  enlarged subcarinal node measuring 1.4 cm (series 4, image 69) and lower right paratracheal node measuring 1.0 cm (series 4, image 51). No enlarged axillary or hilar lymph nodes. Minimal debris within the distal trachea and right mainstem bronchus. Esophagus within normal limits. Lungs/Pleura: Lungs are clear. No pleural effusion or pneumothorax. Upper Abdomen: No acute abnormality. Musculoskeletal: No chest wall abnormality. No acute or significant osseous findings. Review of the MIP images confirms the above findings. IMPRESSION: 1. Examination is positive for pulmonary embolism within the lobar branch pulmonary artery of the right upper lobe. Small clot burden. No evidence of right heart strain. 2. Lungs are clear. 3. Minimal debris within the distal trachea and right mainstem bronchus. 4. Mildly enlarged mediastinal lymph nodes, nonspecific. 5. Aortic and coronary artery atherosclerosis (ICD10-I70.0). Critical Value/emergent results were called by telephone at the time of interpretation on 03/28/2022 at 10:05 am to provider [facility] , who verbally acknowledged these results. Electronically Signed   By: Davina Poke D.O.   On: 03/28/2022 10:06   DG Chest Port 1 View  Result Date: 03/28/2022 CLINICAL DATA:  Dyspnea.  Right leg  swelling x6 months. EXAM: PORTABLE CHEST 1 VIEW COMPARISON:  02/05/2019. FINDINGS: 0844 hours. Left chest dual-chamber pacemaker with leads projecting over the right atrium and ventricle. Low lung volumes accentuate the pulmonary vasculature and cardiomediastinal silhouette. Otherwise stable heart size and mediastinal contours accounting for patient rotation. Linear opacities in the right lung base, likely atelectasis. No pleural effusion or pneumothorax. IMPRESSION: Low lung volumes with right basilar atelectasis. Electronically Signed   By: Orvan Falconer M.D.   On: 03/28/2022 08:57    Microbiology: Results for orders placed or performed during the hospital encounter of 03/28/22   Culture, blood (Routine X 2) w Reflex to ID Panel     Status: None (Preliminary result)   Collection Time: 03/28/22 11:30 AM   Specimen: BLOOD  Result Value Ref Range Status   Specimen Description BLOOD BLOOD LEFT ARM  Final   Special Requests   Final    BOTTLES DRAWN AEROBIC AND ANAEROBIC Blood Culture adequate volume   Culture   Final    NO GROWTH < 24 HOURS Performed at [facility], [address] [place], [place] [address]    Report Status PENDING  Incomplete  Culture, blood (Routine X 2) w Reflex to ID Panel     Status: None (Preliminary result)   Collection Time: 03/28/22 11:30 AM   Specimen: BLOOD  Result Value Ref Range Status   Specimen Description BLOOD BLOOD RIGHT ARM  Final   Special Requests   Final    BOTTLES DRAWN AEROBIC AND ANAEROBIC Blood Culture adequate volume   Culture   Final    NO GROWTH < 24 HOURS Performed at [facility], [address] [place], [place] [address]    Report Status PENDING  Incomplete    Labs: CBC: Recent Labs  Lab 03/28/22 0834 03/29/22 0100  WBC 12.7* 11.7*  NEUTROABS 7.7  --   HGB 13.6 13.3  HCT 42.4 41.0  MCV 89.6 87.4  PLT 271 270   Basic Metabolic Panel: Recent Labs  Lab 03/28/22 0834 03/29/22 0100  NA 138 136  K 4.1 4.2  CL 106 103  CO2 23 23  GLUCOSE 117* 96  BUN 11 12  CREATININE 0.68 0.66  CALCIUM 9.1 8.9   Liver Function Tests: Recent Labs  Lab 03/28/22 0834  AST 18  ALT 17  ALKPHOS 71  BILITOT 0.5  PROT 8.0  ALBUMIN 3.7   CBG: No results for input(s): "GLUCAP" in the last 168 hours.  Discharge time spent: greater than 30 minutes.  Signed: Marrion Coy, MD [facility] 03/29/2022

## 2022-03-29 NOTE — Progress Notes (Signed)
   03/29/22 0600  Assess: MEWS Score  Temp 98.2 F (36.8 C)  BP (!) 154/84  Pulse Rate (!) 114  Resp (!) 22  Level of Consciousness Alert  SpO2 98 %  O2 Device Room Air  Assess: MEWS Score  MEWS Temp 0  MEWS Systolic 0  MEWS Pulse 2  MEWS RR 1  MEWS LOC 0  MEWS Score 3  MEWS Score Color Yellow  Assess: if the MEWS score is Yellow or Red  Were vital signs taken at a resting state? Yes  Focused Assessment No change from prior assessment  Does the patient meet 2 or more of the SIRS criteria? Yes  Does the patient have a confirmed or suspected source of infection? Yes  Provider and Rapid Response Notified? Yes (provider notified, RR nurse not required at this time, consulted with charge nurse)  MEWS guidelines implemented  Yes, yellow  Treat  MEWS Interventions Considered administering scheduled or prn medications/treatments as ordered  Take Vital Signs  Increase Vital Sign Frequency  Yellow: Q2hr x1, continue Q4hrs until patient remains green for 12hrs  Escalate  MEWS: Escalate Yellow: Discuss with charge nurse and consider notifying provider and/or RRT  Notify: Charge Nurse/RN  Name of Charge Nurse/RN Notified [facility]  Provider Notification  Provider Name/Title Neomia Glass, [place]  Date Provider Notified 03/29/22  Time Provider Notified [phone]  Notification Reason New onset of dysrhythmia;Other (Comment) (Tachycardia)  Provider response See new orders  Date of Provider Response 03/29/22  Time of Provider Response [phone]  Notify: Rapid Response  Name of Rapid Response RN Notified not required at this time  Assess: SIRS CRITERIA  SIRS Temperature  0  SIRS Pulse 1  SIRS Respirations  1  SIRS WBC 0  SIRS Score Sum  2

## 2022-03-29 NOTE — Progress Notes (Signed)
EKG performed and in epic. NSR with non specific T wave abnormalty. Pt asymptomatic at this time. Jaci Carrel, NP notified.

## 2022-03-29 NOTE — Consult Note (Signed)
[facility] for transition from Heparin to Apixaban Indication: pulmonary embolus  Patient Measurements: Height: 5\' 10"  (177.8 cm) Weight: (!) 139.3 kg (307 lb 1.6 oz) IBW/kg (Calculated) : 73 Heparin Dosing Weight: 102 kg  Labs: Recent Labs    03/28/22 0834 03/28/22 1137 03/28/22 1731 03/29/22 0100  HGB 13.6  --   --  13.3  HCT 42.4  --   --  41.0  PLT 271  --   --  270  APTT  --  35  --   --   LABPROT  --  13.9  --   --   INR  --  1.1  --   --   HEPARINUNFRC  --   --  0.25* 0.49  CREATININE 0.68  --   --  0.66  TROPONINIHS 14  --   --   --     Estimated Creatinine Clearance: 141.6 mL/min (by C-G formula based on SCr of 0.66 mg/dL).  Medical History: Past Medical History:  Diagnosis Date   CAD (coronary artery disease)    a. 04/2015 Cath: LM nl, LAD 10ost/p, RI nl, LCX 10p, OM1/2/3 nl, RCA nl. Nl LV fxn.   Chronic heart failure with preserved ejection fraction (HFpEF) ([place])    a 03/2021 Echo: EF 60-65%, no rwma, mild LVH, nl RV fxn, mild MR, Ao sclerosis.   Depression    Hyperlipidemia    LVH (left ventricular hypertrophy)    Seizure (HCC)    Sleep apnea    SSS (sick sinus syndrome) ([place])    a. 04/2015 s/p BSX DC PPM (ser #[id number]).   Vitamin D deficiency     Medications:  No anticoagulation prior to admission per my chart review. Medication reconciliation is pending  Assessment: 57 y/o M with medical history as above presenting to the ED 2/7 with right leg swelling. Saw PCP day prior to presentation for spider bide to left buttock and referred to ED for possible blood clot. Lower extremity ultrasound negative for DVT. CTA positive for acute PE. Pharmacy was consulted to initiate and manage heparin infusion for acute PE. Patient is now being transitioned to apixaban per pharmacy.  Plan:  Heparin has been discontinued. Will initiate Apixaban 10mg  twice daily for 7 days then decrease to 5mg  twice daily. Patient copay for Apixaban  will be $4. Follow CBC per protocol.  Paulina Fusi, PharmD, BCPS 03/29/2022 8:16 AM

## 2022-04-02 LAB — CULTURE, BLOOD (ROUTINE X 2)
Culture: NO GROWTH
Culture: NO GROWTH
Special Requests: ADEQUATE
Special Requests: ADEQUATE

## 2022-05-08 ENCOUNTER — Ambulatory Visit: Payer: Medicaid Other | Admitting: Internal Medicine

## 2022-06-21 ENCOUNTER — Ambulatory Visit: Payer: Medicaid Other | Admitting: Podiatry

## 2022-07-03 ENCOUNTER — Encounter: Payer: Self-pay | Admitting: Internal Medicine

## 2022-07-03 ENCOUNTER — Ambulatory Visit: Payer: Medicaid Other | Attending: Internal Medicine | Admitting: Internal Medicine

## 2022-07-03 VITALS — BP 174/90 | HR 58 | Ht 70.0 in | Wt 290.8 lb

## 2022-07-03 DIAGNOSIS — Z950 Presence of cardiac pacemaker: Secondary | ICD-10-CM | POA: Insufficient documentation

## 2022-07-03 DIAGNOSIS — I5033 Acute on chronic diastolic (congestive) heart failure: Secondary | ICD-10-CM | POA: Insufficient documentation

## 2022-07-03 DIAGNOSIS — I495 Sick sinus syndrome: Secondary | ICD-10-CM | POA: Insufficient documentation

## 2022-07-03 LAB — CUP PACEART INCLINIC DEVICE CHECK
Date Time Interrogation Session: 20240514162411
Implantable Lead Connection Status: 753985
Implantable Lead Connection Status: 753985
Implantable Lead Implant Date: 20170315
Implantable Lead Implant Date: 20170315
Implantable Lead Location: 753859
Implantable Lead Location: 753860
Implantable Lead Model: 7741
Implantable Lead Model: 7742
Implantable Lead Serial Number: 707950
Implantable Lead Serial Number: 749257
Implantable Pulse Generator Implant Date: 20170315
Lead Channel Impedance Value: 569 Ohm
Lead Channel Impedance Value: 679 Ohm
Lead Channel Pacing Threshold Amplitude: 0.8 V
Lead Channel Pacing Threshold Amplitude: 1.1 V
Lead Channel Pacing Threshold Pulse Width: 0.4 ms
Lead Channel Pacing Threshold Pulse Width: 0.4 ms
Lead Channel Sensing Intrinsic Amplitude: 25 mV
Lead Channel Sensing Intrinsic Amplitude: 7.7 mV
Lead Channel Setting Pacing Amplitude: 2 V
Lead Channel Setting Pacing Amplitude: 2.4 V
Lead Channel Setting Pacing Pulse Width: 0.4 ms
Lead Channel Setting Sensing Sensitivity: 0.6 mV
Pulse Gen Serial Number: 718057
Zone Setting Status: 755011

## 2022-07-03 MED ORDER — FUROSEMIDE 40 MG PO TABS: 60.0000 mg | ORAL_TABLET | Freq: Every day | ORAL | 7 refills | Status: DC

## 2022-07-03 MED ORDER — APIXABAN 5 MG PO TABS: 5.0000 mg | ORAL_TABLET | Freq: Two times a day (BID) | ORAL | 7 refills | Status: DC

## 2022-07-03 NOTE — Progress Notes (Signed)
HPI [name] returns today after a long absence from the EP clinic. He is a pleasant 57 yo man with a h/o syncope and long pauses on cardiac monitori due to sinus node dysfunction s/p PPM insertion. The patient had a PE a few months ago. He was initially on eliquis but stopped taking. He has developed chronic peripheral edema. He admits to sodium indiscretion.  Allergies  Allergen Reactions   Bee Venom Anaphylaxis and Swelling   Hornet Venom Shortness Of Breath and Swelling   Influenza Vaccines Other (See Comments)    Seizures    Peanuts [Peanut Oil] Anaphylaxis   Phenytoin Hives and Rash   Valproic Acid Hives and Rash   Lamotrigine Rash     Current Outpatient Medications  Medication Sig Dispense Refill   albuterol (PROVENTIL HFA;VENTOLIN HFA) 108 (90 Base) MCG/ACT inhaler Inhale 2 puffs into the lungs every 4 (four) hours as needed for wheezing or shortness of breath.     amoxicillin-clavulanate (AUGMENTIN) 875-125 MG tablet Take 1 tablet by mouth 2 (two) times daily.     apixaban (ELIQUIS) 5 MG TABS tablet Take 1 tablet (5 mg total) by mouth 2 (two) times daily. 90 tablet 7   APIXABAN (ELIQUIS) VTE STARTER PACK (10MG  AND 5MG ) Take as directed on package: start with two-5mg  tablets twice daily for 7 days. On day 8, switch to one-5mg  tablet twice daily. 1 each 0   aspirin EC 81 MG EC tablet Take 1 tablet (81 mg total) by mouth daily.     atorvastatin (LIPITOR) 40 MG tablet Take 40 mg by mouth daily.     carvedilol (COREG) 6.25 MG tablet Take 1 tablet (6.25 mg total) by mouth 2 (two) times daily with a meal. 120 tablet 0   cholecalciferol (VITAMIN D) 1000 units tablet Take 1,000 Units by mouth daily.     Fluticasone-Salmeterol (ADVAIR) 250-50 MCG/DOSE AEPB Inhale 1 puff into the lungs 2 (two) times daily.     losartan (COZAAR) 50 MG tablet Take 1 tablet (50 mg total) by mouth daily. 30 tablet 0   nitroGLYCERIN (NITROSTAT) 0.4 MG SL tablet Place 1 tablet (0.4 mg total) under the  tongue every 5 (five) minutes as needed for chest pain. 25 tablet 3   oxyCODONE-acetaminophen (PERCOCET/ROXICET) 5-325 MG tablet Take 1 tablet by mouth every 6 (six) hours as needed for moderate pain. 12 tablet 0   PHENObarbital (LUMINAL) 100 MG tablet Take 200 mg by mouth at bedtime.      furosemide (LASIX) 40 MG tablet Take 1.5 tablets (60 mg total) by mouth daily. 90 tablet 7   No current facility-administered medications for this visit.     Past Medical History:  Diagnosis Date   CAD (coronary artery disease)    a. 04/2015 Cath: LM nl, LAD 10ost/p, RI nl, LCX 10p, OM1/2/3 nl, RCA nl. Nl LV fxn.   Chronic heart failure with preserved ejection fraction (HFpEF) (HCC)    a 03/2021 Echo: EF 60-65%, no rwma, mild LVH, nl RV fxn, mild MR, Ao sclerosis.   Depression    Hyperlipidemia    LVH (left ventricular hypertrophy)    Seizure (HCC)    Sleep apnea    SSS (sick sinus syndrome) (HCC)    a. 04/2015 s/p BSX DC PPM (ser #[id number]).   Vitamin D deficiency     ROS:   All systems reviewed and negative except as noted in the HPI.   Past Surgical History:  Procedure Laterality Date  CARDIAC CATHETERIZATION N/A 05/04/2015   Procedure: Left Heart Cath and Coronary Angiography;  Surgeon: Iran Ouch, MD;  Location: [facility] INVASIVE CV LAB;  Service: Cardiovascular;  Laterality: N/A;   EP IMPLANTABLE DEVICE N/A 05/04/2015   Procedure: Pacemaker Implant;  Surgeon: Marinus Maw, MD;  Location: [facility] INV[facility] CV LAB;  Service: Cardiovascular;  Laterality: N/A;     Family History  Problem Relation Age of Onset   Hypertension Mother    Emphysema Mother    Diabetes Other        sibling   Heart disease Other        sibling   Hypertension Brother      Social History   Socioeconomic History   Marital status: Single    Spouse name: Not on file   Number of children: Not on file   Years of education: Not on file   Highest education level: Not on file  Occupational History   Not on file   Tobacco Use   Smoking status: Every Day    Packs/day: 0.50    Years: 20.00    Additional pack years: 0.00    Total pack years: 10.00    Types: Cigarettes   Smokeless tobacco: Never   Tobacco comments:    6 a day.  Vaping Use   Vaping Use: Never used  Substance and Sexual Activity   Alcohol use: No   Drug use: No   Sexual activity: Not Currently  Other Topics Concern   Not on file  Social History Narrative   Not on file   Social Determinants of Health   Financial Resource Strain: Not on file  Food Insecurity: No Food Insecurity (03/28/2022)   Hunger Vital Sign    Worried About Running Out of Food in the Last Year: Never true    Ran Out of Food in the Last Year: Never true  Transportation Needs: No Transportation Needs (03/28/2022)   PRAPARE - Administrator, Civil Service (Medical): No    Lack of Transportation (Non-Medical): No  Physical Activity: Not on file  Stress: Not on file  Social Connections: Not on file  Intimate Partner Violence: Not At Risk (03/28/2022)   Humiliation, Afraid, Rape, and Kick questionnaire    Fear of Current or Ex-Partner: No    Emotionally Abused: No    Physically Abused: No    Sexually Abused: No     BP (!) 174/90   Pulse (!) 58   Ht 5\' 10"  (1.778 m)   Wt 290 lb 12.8 oz (131.9 kg)   SpO2 98%   BMI 41.73 kg/m   Physical Exam:  Morbidly obese appearing NAD HEENT: Unremarkable Neck:  No JVD, no thyromegally Lymphatics:  No adenopathy Back:  No CVA tenderness Lungs:  Clear with no wheezes HEART:  Regular rate rhythm, no murmurs, no rubs, no clicks Abd:  soft, positive bowel sounds, no organomegally, no rebound, no guarding Ext:  2 plus pulses, no edema, no cyanosis, no clubbing Skin:  No rashes no nodules Neuro:  CN II through XII intact, motor grossly intact  EKG - nsr   DEVICE  Normal device function.  See PaceArt for details.  Premature battery depletion. 7 months to ERI.  Assess/Plan:  Sinus node dysfunction -  he is asymptomatic s/p PPM insertion. 2. PPM - his [employer] DDD PM is working normally. His battery is down. He will need a gen change in the next 7 months possibly earlier. 3. PE -  he will go back on eliquis for at least 6 more months. 4. Diastolic heart failure - he has class 2 symptoms and is encouraged to avoid salty foods.   Leonia Reeves.D.

## 2022-07-03 NOTE — Patient Instructions (Addendum)
Medication Instructions:  Your physician has recommended you make the following change in your medication: START TAKING: Eliquis 5 mg, by mouth, twice daily. Eliquis: You will-  Take 1 tablet (5 mg total) by mouth 2 (two) times daily.  Lasix 40 mg:   You will-  Take 1.5 tablets (60 mg total) by mouth daily.    Lab Work: None ordered.  If you have labs (blood work) drawn today and your tests are completely normal, you will receive your results only by: MyChart Message (if you have MyChart) OR A paper copy in the mail If you have any lab test that is abnormal or we need to change your treatment, we will call you to review the results.  Testing/Procedures: None ordered.  Follow-Up: At [facility], you and your health needs are our priority.  As part of our continuing mission to provide you with exceptional heart care, we have created designated Provider Care Teams.  These Care Teams include your primary Cardiologist (physician) and Advanced Practice Providers (APPs -  Physician Assistants and Nurse Practitioners) who all work together to provide you with the care you need, when you need it.   Your next appointment:   Please schedule a 6 month follow up appointment with Dr. Lewayne Bunting  The format for your next appointment:   In Person  Provider:   Lewayne Bunting, MD{or one of the following Advanced Practice Providers on your designated Care Team:   Francis Dowse, [place] Casimiro Needle "Mardelle Matte" [place], [place]   Apixaban Tablets What is this medication? APIXABAN (a PIX a ban) prevents and treats blood clots. It is also used to lower the risk of stroke in people with AFib (atrial fibrillation). It belongs to a group of medications called blood thinners. This medicine may be used for other purposes; ask your health care provider or pharmacist if you have questions. COMMON BRAND NAME(S): Eliquis What should I tell my care team before I take this medication? They need to know if you have any of  these conditions: Antiphospholipid antibody syndrome Bleeding disorder History of bleeding in the brain History of blood clots History of stomach bleeding Kidney disease Liver disease Mechanical heart valve Spinal surgery An unusual or allergic reaction to apixaban, other medications, foods, dyes, or preservatives Pregnant or trying to get pregnant Breastfeeding How should I use this medication? Take this medication by mouth. For your therapy to work as well as possible, take each dose exactly as prescribed on the prescription label. Do not skip doses. Skipping doses or stopping this medication can increase your risk of a blood clot or stroke. Keep taking this medication unless your care team tells you to stop. Take it as directed on the prescription label at the same time every day. You can take it with or without food. If it upsets your stomach, take it with food. A special MedGuide will be given to you by the pharmacist with each prescription and refill. Be sure to read this information carefully each time. Talk to your care team about the use of this medication in children. Special care may be needed. Overdosage: If you think you have taken too much of this medicine contact a poison control center or emergency room at once. NOTE: This medicine is only for you. Do not share this medicine with others. What if I miss a dose? If you miss a dose, take it as soon as you can. If it is almost time for your next dose, take only that dose. Do  not take double or extra doses. What may interact with this medication? This medication may interact with the following: Aspirin and aspirin-like medications Certain medications for fungal infections like itraconazole and ketoconazole Certain medications for seizures like carbamazepine and phenytoin Certain medications for blood clots like enoxaparin, dalteparin, heparin, and warfarin Clarithromycin NSAIDs, medications for pain and inflammation, like  ibuprofen or naproxen Rifampin Ritonavir St. John's wort This list may not describe all possible interactions. Give your health care provider a list of all the medicines, herbs, non-prescription drugs, or dietary supplements you use. Also tell them if you smoke, drink alcohol, or use illegal drugs. Some items may interact with your medicine. What should I watch for while using this medication? Visit your care team for regular checks on your progress. Your condition will be monitored carefully while you are receiving this medication. You may need blood work while taking this medication. Avoid sports and activities that might cause injury while you are using this medication. Severe falls or injuries can cause unseen bleeding. Be careful when using sharp tools or knives. Consider using an Neurosurgeon. Take special care brushing or flossing your teeth. Report any injuries, bruising, or red spots on the skin to your care team. If you are going to need surgery or other procedure, tell your care team that you are taking this medication. Wear a medical ID bracelet or chain. Carry a card that describes your condition. List the medications and doses you take on the card. What side effects may I notice from receiving this medication? Side effects that you should report to your care team as soon as possible: Allergic reactions--skin rash, itching, hives, swelling of the face, lips, tongue, or throat Bleeding--bloody or black, tar-like stools, vomiting blood or brown material that looks like coffee grounds, red or dark brown urine, small red or purple spots on the skin, unusual bruising or bleeding Bleeding in the brain--severe headache, stiff neck, confusion, dizziness, change in vision, numbness or weakness of the face, arm, or leg, trouble speaking, trouble walking, vomiting Heavy periods This list may not describe all possible side effects. Call your doctor for medical advice about side effects. You may  report side effects to FDA at 1-800-FDA-1088. Where should I keep my medication? Keep out of the reach of children and pets. Store at room temperature between 20 and 25 degrees C (68 and 77 degrees F). Get rid of any unused medication after the expiration date. To get rid of medications that are no longer needed or expired: Take the medication to a medication take-back program. Check with your pharmacy or law enforcement to find a location. If you cannot return the medication, check the label or package insert to see if the medication should be thrown out in the garbage or flushed down the toilet. If you are not sure, ask your care team. If it is safe to put in the trash, empty the medication out of the container. Mix the medication with cat litter, dirt, coffee grounds, or other unwanted substance. Seal the mixture in a bag or container. Put it in the trash. NOTE: This sheet is a summary. It may not cover all possible information. If you have questions about this medicine, talk to your doctor, pharmacist, or health care provider.  2023 Elsevier/Gold Standard (2011-02-21 00:00:00)  Furosemide Tablets What is this medication? FUROSEMIDE (fyoor [place] se mide) treats high blood pressure. It may also be used to reduce swelling related to heart, kidney, or liver disease. It helps your  kidneys remove more fluid and salt from your blood through the urine. It belongs to a group of medications called diuretics. This medicine may be used for other purposes; ask your health care provider or pharmacist if you have questions. COMMON BRAND NAME(S): Active-Medicated Specimen Kit, Delone, Diuscreen, Lasix, RX Specimen Collection Kit, Specimen Collection Kit, URINX Medicated Specimen Collection What should I tell my care team before I take this medication? They need to know if you have any of these conditions: Diarrhea or vomiting Gout Heart disease High or low levels of electrolytes, such as magnesium, potassium,  or sodium in your blood Kidney disease, small amounts of urine, or difficulty passing urine Liver disease Thyroid disease An unusual or allergic reaction to furosemide, sulfa medications, other medications, foods, dyes, or preservatives Pregnant or trying to get pregnant Breast-feeding How should I use this medication? Take this medication by mouth. Take it as directed on the prescription label at the same time every day. You can take it with or without food. If it upsets your stomach, take it with food. Keep taking it unless your care team tells you to stop. Talk to your care team about the use of this medication in children. Special care may be needed. Overdosage: If you think you have taken too much of this medicine contact a poison control center or emergency room at once. NOTE: This medicine is only for you. Do not share this medicine with others. What if I miss a dose? If you miss a dose, take it as soon as you can. If it is almost time for your next dose, take only that dose. Do not take double or extra doses. What may interact with this medication? Aspirin and aspirin-like medications Certain antibiotics Chloral hydrate Cisplatin Cyclosporine Digoxin Diuretics Laxatives Lithium Medications for blood pressure Medications that relax muscles for surgery Methotrexate NSAIDs, medications for pain and inflammation, such as ibuprofen, naproxen, or indomethacin Phenytoin Steroid medications, such as prednisone or cortisone Sucralfate Thyroid hormones This list may not describe all possible interactions. Give your health care provider a list of all the medicines, herbs, non-prescription drugs, or dietary supplements you use. Also tell them if you smoke, drink alcohol, or use illegal drugs. Some items may interact with your medicine. What should I watch for while using this medication? Visit your care team for regular checks on your progress. Tell your care team if your symptoms do  not start to get better or if they get worse. Check your blood pressure as directed. Know what your blood pressure should be and when to contact your care team. This medication may increase the amount of sugar in blood or urine. The risk may be higher in patients who already have diabetes. Ask your care team what you can do to lower your risk of diabetes while taking this medication. You may need to be on a special diet while taking this medication. Check with your care team. Also, ask how many glasses of fluid you need to drink a day. You must not get dehydrated. This medication may affect your coordination, reaction time, or judgment. Do not drive or operate machinery until you know how this medication affects you. Sit up or stand slowly to reduce the risk of dizzy or fainting spells. Drinking alcohol with this medication can increase the risk of these side effects. This medication can make you more sensitive to the sun. Keep out of the sun. If you cannot avoid being in the sun, wear protective clothing and use  sunscreen. Do not use sun lamps or tanning beds/booths. Check with your care team if you have severe diarrhea, nausea, and vomiting, or if you sweat a lot. The loss of too much body fluid may make it dangerous for you to take this medication. What side effects may I notice from receiving this medication? Side effects that you should report to your care team as soon as possible: Allergic reactions--skin rash, itching, hives, swelling of the face, lips, tongue, or throat Dehydration--increased thirst, dry mouth, feeling faint or lightheaded, headache, dark yellow or brown urine Hearing loss, ringing in ears High blood sugar (hyperglycemia)--increased thirst or amount of urine, unusual weakness or fatigue, blurry vision Low blood pressure--dizziness, feeling faint or lightheaded, blurry vision Low potassium level--muscle pain or cramps, unusual weakness or fatigue, fast or irregular heartbeat,  constipation Side effects that usually do not require medical attention (report to your care team if they continue or are bothersome): Burning or tingling sensation in hands or feet Constipation Diarrhea Dizziness Headache This list may not describe all possible side effects. Call your doctor for medical advice about side effects. You may report side effects to FDA at 1-800-FDA-1088. Where should I keep my medication? Keep out of the reach of children and pets. Store at room temperature between 20 and 25 degrees C (68 and 77 degrees F). Protect from light and moisture. Keep the container tightly closed. Throw away any unused medication after the expiration date. NOTE: This sheet is a summary. It may not cover all possible information. If you have questions about this medicine, talk to your doctor, pharmacist, or health care provider.  2023 Elsevier/Gold Standard (2020-05-13 00:00:00)

## 2022-07-09 ENCOUNTER — Ambulatory Visit: Payer: Medicaid Other | Admitting: Podiatry

## 2022-07-09 VITALS — BP 167/76

## 2022-07-09 DIAGNOSIS — M79674 Pain in right toe(s): Secondary | ICD-10-CM

## 2022-07-09 DIAGNOSIS — R600 Localized edema: Secondary | ICD-10-CM

## 2022-07-09 DIAGNOSIS — B351 Tinea unguium: Secondary | ICD-10-CM | POA: Diagnosis not present

## 2022-07-09 DIAGNOSIS — M79675 Pain in left toe(s): Secondary | ICD-10-CM | POA: Diagnosis not present

## 2022-07-09 DIAGNOSIS — B353 Tinea pedis: Secondary | ICD-10-CM

## 2022-07-09 DIAGNOSIS — L0889 Other specified local infections of the skin and subcutaneous tissue: Secondary | ICD-10-CM

## 2022-07-09 LAB — CUP PACEART REMOTE DEVICE CHECK
Battery Remaining Longevity: 4 mo
Battery Remaining Percentage: 3 %
Brady Statistic RA Percent Paced: 2 %
Brady Statistic RV Percent Paced: 0 %
Date Time Interrogation Session: 20240517145400
Implantable Lead Connection Status: 753985
Implantable Lead Connection Status: 753985
Implantable Lead Implant Date: 20170315
Implantable Lead Implant Date: 20170315
Implantable Lead Location: 753859
Implantable Lead Location: 753860
Implantable Lead Model: 7741
Implantable Lead Model: 7742
Implantable Lead Serial Number: 707950
Implantable Lead Serial Number: 749257
Implantable Pulse Generator Implant Date: 20170315
Lead Channel Impedance Value: 595 Ohm
Lead Channel Impedance Value: 718 Ohm
Lead Channel Setting Pacing Amplitude: 2 V
Lead Channel Setting Pacing Amplitude: 2 V
Lead Channel Setting Pacing Pulse Width: 0.4 ms
Lead Channel Setting Sensing Sensitivity: 2.5 mV
Pulse Gen Serial Number: 718057
Zone Setting Status: 755011

## 2022-07-09 MED ORDER — KETOCONAZOLE 2 % EX CREA: TOPICAL_CREAM | CUTANEOUS | 1 refills | Status: DC
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
## 2022-07-09 NOTE — Patient Instructions (Signed)
To prevent reinfection, spray shoes with lysol every evening.  Clean tub or shower with bleach based cleanser.   Athlete's Foot Athlete's foot (tinea pedis) is a fungal infection of the skin on your feet. It often occurs on the skin that is between or underneath the toes. It can also occur on the soles of your feet. The infection can spread from person to person (is contagious). It can also spread when a person's bare feet come in contact with the fungus on shower floors or on items such as shoes. What are the causes? This condition is caused by a fungus that grows in warm, moist places. You can get athlete's foot by sharing shoes, shower stalls, towels, and wet floors with someone who is infected. Not washing your feet or changing your socks often enough can also lead to athlete's foot. What increases the risk? This condition is more likely to develop in: Men. People who have a weak body defense system (immune system). People who have diabetes. People who use public showers, such as at a gym. People who wear heavy-duty shoes, such as industrial or military shoes. Seasons with warm, humid weather. What are the signs or symptoms? Symptoms of this condition include: Itchy areas between your toes or on the soles of your feet. White, flaky, or scaly areas between your toes or on the soles of your feet. Very itchy small blisters between your toes or on the soles of your feet. Small cuts in your skin. These cuts can become infected. Thick or discolored toenails. How is this diagnosed? This condition may be diagnosed with a physical exam and a review of your medical history. Your health care provider may also take a skin or toenail sample to examine under a microscope. How is this treated? This condition is treated with antifungal medicines. These may be applied as powders, ointments, or creams. In severe cases, an oral antifungal medicine may be given. Follow these instructions at  home: Medicines Apply or take over-the-counter and prescription medicines only as told by your health care provider. Apply your antifungal medicine as told by your health care provider. Do not stop using the antifungal even if your condition improves. Foot care Do not scratch your feet. Keep your feet dry: Wear cotton or wool socks. Change your socks every day or if they become wet. Wear shoes that allow air to flow, such as sandals or canvas tennis shoes. Wash and dry your feet, including the area between your toes. Also, wash and dry your feet: Every day or as told by your health care provider. After exercising. General instructions Do not let others use towels, shoes, nail clippers, or other personal items that touch your feet. Protect your feet by wearing sandals in wet areas, such as locker rooms and shared showers. Keep all follow-up visits. This is important. If you have diabetes, keep your blood sugar under control. Contact a health care provider if: You have a fever. You have swelling, soreness, warmth, or redness in your foot. Your feet are not getting better with treatment. Your symptoms get worse. You have new symptoms. You have severe pain. Summary Athlete's foot (tinea pedis) is a fungal infection of the skin on your feet. It often occurs on skin that is between or underneath the toes. This condition is caused by a fungus that grows in warm, moist places. Symptoms include white, flaky, or scaly areas between your toes or on the soles of your feet. This condition is treated with antifungal medicines.   Keep your feet clean. Always dry them thoroughly. This information is not intended to replace advice given to you by your health care provider. Make sure you discuss any questions you have with your health care provider. Document Revised: 05/29/2020 Document Reviewed: 05/29/2020 Elsevier Patient Education  2023 Elsevier Inc[employer]  

## 2022-07-13 ENCOUNTER — Encounter: Payer: Self-pay | Admitting: Podiatry

## 2022-08-09 ENCOUNTER — Ambulatory Visit: Payer: Medicaid Other

## 2022-08-09 DIAGNOSIS — I495 Sick sinus syndrome: Secondary | ICD-10-CM

## 2022-08-09 LAB — CUP PACEART REMOTE DEVICE CHECK
Battery Remaining Longevity: 3 mo
Battery Remaining Percentage: 3 %
Brady Statistic RA Percent Paced: 1 %
Brady Statistic RV Percent Paced: 0 %
Date Time Interrogation Session: 20240620025700
Implantable Lead Connection Status: 753985
Implantable Lead Connection Status: 753985
Implantable Lead Implant Date: 20170315
Implantable Lead Implant Date: 20170315
Implantable Lead Location: 753859
Implantable Lead Location: 753860
Implantable Lead Model: 7741
Implantable Lead Model: 7742
Implantable Lead Serial Number: 707950
Implantable Lead Serial Number: 749257
Implantable Pulse Generator Implant Date: 20170315
Lead Channel Impedance Value: 551 Ohm
Lead Channel Impedance Value: 668 Ohm
Lead Channel Setting Pacing Amplitude: 2 V
Lead Channel Setting Pacing Amplitude: 2 V
Lead Channel Setting Pacing Pulse Width: 0.4 ms
Lead Channel Setting Sensing Sensitivity: 2.5 mV
Pulse Gen Serial Number: 718057
Zone Setting Status: 755011

## 2022-08-21 ENCOUNTER — Emergency Department: Payer: MEDICAID

## 2022-08-21 ENCOUNTER — Other Ambulatory Visit: Payer: Self-pay

## 2022-08-21 ENCOUNTER — Emergency Department
Admission: EM | Admit: 2022-08-21 | Discharge: 2022-08-21 | Disposition: A | Discharge status: Home / Self Care | Payer: MEDICAID | Attending: Emergency Medicine | Admitting: Emergency Medicine

## 2022-08-21 DIAGNOSIS — Z720 Tobacco use: Secondary | ICD-10-CM | POA: Insufficient documentation

## 2022-08-21 DIAGNOSIS — Z5189 Encounter for other specified aftercare: Secondary | ICD-10-CM

## 2022-08-21 DIAGNOSIS — R2241 Localized swelling, mass and lump, right lower limb: Secondary | ICD-10-CM | POA: Diagnosis present

## 2022-08-21 DIAGNOSIS — M7989 Other specified soft tissue disorders: Secondary | ICD-10-CM

## 2022-08-21 DIAGNOSIS — I509 Heart failure, unspecified: Secondary | ICD-10-CM | POA: Diagnosis not present

## 2022-08-21 DIAGNOSIS — I2510 Atherosclerotic heart disease of native coronary artery without angina pectoris: Secondary | ICD-10-CM | POA: Diagnosis not present

## 2022-08-21 DIAGNOSIS — Z23 Encounter for immunization: Secondary | ICD-10-CM | POA: Diagnosis not present

## 2022-08-21 LAB — CBC WITH DIFFERENTIAL/PLATELET
Abs Immature Granulocytes: 0.03 10*3/uL (ref 0.00–0.07)
Basophils Absolute: 0 10*3/uL (ref 0.0–0.1)
Basophils Relative: 0 %
Eosinophils Absolute: 0.2 10*3/uL (ref 0.0–0.5)
Eosinophils Relative: 2 %
HCT: 44.3 % (ref 39.0–52.0)
Hemoglobin: 14.5 g/dL (ref 13.0–17.0)
Immature Granulocytes: 0 %
Lymphocytes Relative: 31 %
Lymphs Abs: 3.3 10*3/uL (ref 0.7–4.0)
MCH: 28.8 pg (ref 26.0–34.0)
MCHC: 32.7 g/dL (ref 30.0–36.0)
MCV: 87.9 fL (ref 80.0–100.0)
Monocytes Absolute: 0.7 10*3/uL (ref 0.1–1.0)
Monocytes Relative: 7 %
Neutro Abs: 6.3 10*3/uL (ref 1.7–7.7)
Neutrophils Relative %: 60 %
Platelets: 254 10*3/uL (ref 150–400)
RBC: 5.04 MIL/uL (ref 4.22–5.81)
RDW: 14.5 % (ref 11.5–15.5)
WBC: 10.6 10*3/uL — ABNORMAL HIGH (ref 4.0–10.5)
nRBC: 0 % (ref 0.0–0.2)

## 2022-08-21 LAB — COMPREHENSIVE METABOLIC PANEL
ALT: 21 U/L (ref 0–44)
AST: 18 U/L (ref 15–41)
Albumin: 3.9 g/dL (ref 3.5–5.0)
Alkaline Phosphatase: 75 U/L (ref 38–126)
Anion gap: 8 (ref 5–15)
BUN: 13 mg/dL (ref 6–20)
CO2: 25 mmol/L (ref 22–32)
Calcium: 9 mg/dL (ref 8.9–10.3)
Chloride: 104 mmol/L (ref 98–111)
Creatinine, Ser: 0.8 mg/dL (ref 0.61–1.24)
GFR, Estimated: >60 mL/min (ref >60)
Glucose, Bld: 119 mg/dL — ABNORMAL HIGH (ref 70–99)
Potassium: 4 mmol/L (ref 3.5–5.1)
Sodium: 137 mmol/L (ref 135–145)
Total Bilirubin: 0.6 mg/dL (ref 0.3–1.2)
Total Protein: 7.4 g/dL (ref 6.5–8.1)

## 2022-08-21 MED ORDER — TETANUS-DIPHTH-ACELL PERTUSSIS 5-2.5-18.5 LF-MCG/0.5 IM SUSY
0.5000 mL | PREFILLED_SYRINGE | Freq: Once | INTRAMUSCULAR | Status: AC
  Administered 2022-08-21: 0.5 mL via INTRAMUSCULAR
  Filled 2022-08-21: qty 0.5

## 2022-08-21 NOTE — ED Provider Notes (Signed)
[facility] Emergency Department Provider Note     Event Date/Time   First MD Initiated Contact with Patient 08/21/22 1428     (approximate)   History   Leg Swelling   HPI  Seth Tucker is a 57 y.o. male with history of CAD, CHF, and daily tobacco use who presents to the ED for wound check and right lower leg swelling x 1 week. Patient reports scraping his leg with a piece of wood last week and has noticed progressive swelling. Patient was seen by his PCP who instructed he come to the ED for possible blood clot and tetanus booster. Unknown tetanus status. Pain endorses leg pain with exertion and history of DVT. Patient denies fever, shortness of breath and chest pain. No other complaints at this time.   Patient reports having a history of bilateral lymphedema in lower extremities.     Physical Exam   Triage Vital Signs: ED Triage Vitals  Enc Vitals Group     BP 08/21/22 1359 (!) 134/57     Pulse Rate 08/21/22 1359 71     Resp 08/21/22 1359 18     Temp 08/21/22 1359 98.1 F (36.7 C)     Temp Source 08/21/22 1359 Oral     SpO2 08/21/22 1359 96 %     Weight 08/21/22 1358 270 lb (122.5 kg)     Height 08/21/22 1358 5\' 9"  (1.753 m)     Head Circumference --      Peak Flow --      Pain Score 08/21/22 1357 0     Pain Loc --      Pain Edu? --      Excl. in GC? --     Most recent vital signs: Vitals:   08/21/22 1359  BP: (!) 134/57  Pulse: 71  Resp: 18  Temp: 98.1 F (36.7 C)  SpO2: 96%   General Awake, no distress.  HEENT NCAT. PERRL. EOMI. No rhinorrhea. Mucous membranes are moist.  CV:  Good peripheral perfusion.  RESP:  Normal effort.  ABD:  No distention.  Other:  Skin on right leg is dry and flaky. Pitting edema 3+. Medium sized healing abrasion over lateral right calf.  No drainage. (-) Homans sign. Neurovascular status intact.    ED Results / Procedures / Treatments   Labs (all labs ordered are listed, but only abnormal  results are displayed) Labs Reviewed  CBC WITH DIFFERENTIAL/PLATELET - Abnormal; Notable for the following components:      Result Value   WBC 10.6 (*)    All other components within normal limits  COMPREHENSIVE METABOLIC PANEL - Abnormal; Notable for the following components:   Glucose, Bld 119 (*)    All other components within normal limits   RADIOLOGY  I personally viewed and evaluated these images as part of my medical decision making, as well as reviewing the written report by the radiologist.  ED Provider Interpretation: Unremarkable  US Venous Img Lower Unilateral Right (DVT)  Result Date: 08/21/2022 CLINICAL DATA:  Pain and swelling EXAM: Right LOWER EXTREMITY VENOUS DOPPLER ULTRASOUND TECHNIQUE: Gray-scale sonography with compression, as well as color and duplex ultrasound, were performed to evaluate the deep venous system(s) from the level of the common femoral vein through the popliteal and proximal calf veins. COMPARISON:  None Available. FINDINGS: VENOUS Normal compressibility of the common femoral, superficial femoral, and popliteal veins, as well as the visualized calf veins. Visualized portions of profunda femoral vein  and great saphenous vein unremarkable. No filling defects to suggest DVT on grayscale or color Doppler imaging. Doppler waveforms show normal direction of venous flow, normal respiratory plasticity and response to augmentation. Limited views of the contralateral common femoral vein are unremarkable. OTHER There is edema in subcutaneous plane without any loculated fluid collections. Limitations: none IMPRESSION: There is no evidence of deep venous thrombosis in right lower extremity. Electronically Signed   By: Ernie Avena M.D.   On: 08/21/2022 17:45     PROCEDURES:  Critical Care performed: No  Procedures   MEDICATIONS ORDERED IN ED: Medications  Tdap (BOOSTRIX) injection 0.5 mL (0.5 mLs Intramuscular Given 08/21/22 1756)     IMPRESSION / MDM /  ASSESSMENT AND PLAN / ED COURSE  I reviewed the triage vital signs and the nursing notes.                               57 y.o. male presents to the emergency department for evaluation and treatment of acute on chronic unilateral leg swelling and wound check. See HPI for further details.   CBC and CMP reassuring.  [place] of right LE reassuring.   Wound shows no discharge, surrounding cellulitis or other signs of infection. The abrasion was cleaned and irrigated with chlorhexidine and saline. The patients leg was dressed with xeroform and a bulky nonadherent dressing. Patient is instructed to keep leg elevated at home to help reduce the swelling and discontinue hydroperoxide use to clean the wound.   He was administered a Tdap booster at discharge. I encouraged patient the follow up with his primary care for a wound check in 1 week. Patient is given ED precautions to return to the ED for any worsening or new symptoms. Patient verbalizes understanding. All questions and concerns were addressed during ED visit.    Patient's presentation is most consistent with acute complicated illness / injury requiring diagnostic workup.  FINAL CLINICAL IMPRESSION(S) / ED DIAGNOSES   Final diagnoses:  Right leg swelling  Encounter for wound re-check     Rx / DC Orders   ED Discharge Orders     None        Note:  This document was prepared using Dragon voice recognition software and may include unintentional dictation errors.    Romeo Apple, Hilbert Briggs A, PA-C 08/21/22 2319    Jene Every, MD 08/24/22 [phone]

## 2022-08-21 NOTE — Discharge Instructions (Addendum)
Using mild soap and water, a wound cleanser, saline, or sterile water. Using a clean gauze to pat the wound dry after cleaning it. Do not rub or scrub the wound.

## 2022-08-21 NOTE — ED Triage Notes (Signed)
Pt sts that he was sent over here from his PCP for the swelling in the right leg. Pt sts that there is a wound on the leg and it needs to be checked out.

## 2022-08-29 NOTE — Progress Notes (Signed)
Remote pacemaker transmission.   

## 2022-09-10 ENCOUNTER — Ambulatory Visit (INDEPENDENT_AMBULATORY_CARE_PROVIDER_SITE_OTHER): Payer: MEDICAID

## 2022-09-10 DIAGNOSIS — I495 Sick sinus syndrome: Secondary | ICD-10-CM

## 2022-09-13 LAB — CUP PACEART REMOTE DEVICE CHECK
Battery Remaining Longevity: 3 mo — CL
Battery Remaining Percentage: 2 %
Brady Statistic RA Percent Paced: 2 %
Brady Statistic RV Percent Paced: 0 %
Date Time Interrogation Session: 20240725023200
Implantable Lead Connection Status: 753985
Implantable Lead Connection Status: 753985
Implantable Lead Implant Date: 20170315
Implantable Lead Implant Date: 20170315
Implantable Lead Location: 753859
Implantable Lead Location: 753860
Implantable Lead Model: 7741
Implantable Lead Model: 7742
Implantable Lead Serial Number: 707950
Implantable Lead Serial Number: 749257
Implantable Pulse Generator Implant Date: 20170315
Lead Channel Impedance Value: 550 Ohm
Lead Channel Impedance Value: 664 Ohm
Lead Channel Setting Pacing Amplitude: 2 V
Lead Channel Setting Pacing Amplitude: 2 V
Lead Channel Setting Pacing Pulse Width: 0.4 ms
Lead Channel Setting Sensing Sensitivity: 2.5 mV
Pulse Gen Serial Number: 718057
Zone Setting Status: 755011

## 2022-09-28 NOTE — Progress Notes (Signed)
Remote pacemaker transmission.   

## 2022-10-11 ENCOUNTER — Ambulatory Visit (INDEPENDENT_AMBULATORY_CARE_PROVIDER_SITE_OTHER): Payer: MEDICAID

## 2022-10-11 ENCOUNTER — Encounter: Payer: Self-pay | Admitting: Podiatry

## 2022-10-11 ENCOUNTER — Ambulatory Visit (INDEPENDENT_AMBULATORY_CARE_PROVIDER_SITE_OTHER): Payer: MEDICAID | Admitting: Podiatry

## 2022-10-11 DIAGNOSIS — M79675 Pain in left toe(s): Secondary | ICD-10-CM

## 2022-10-11 DIAGNOSIS — B353 Tinea pedis: Secondary | ICD-10-CM | POA: Diagnosis not present

## 2022-10-11 DIAGNOSIS — M79674 Pain in right toe(s): Secondary | ICD-10-CM

## 2022-10-11 DIAGNOSIS — L0889 Other specified local infections of the skin and subcutaneous tissue: Secondary | ICD-10-CM | POA: Diagnosis not present

## 2022-10-11 DIAGNOSIS — B351 Tinea unguium: Secondary | ICD-10-CM | POA: Diagnosis not present

## 2022-10-11 DIAGNOSIS — I495 Sick sinus syndrome: Secondary | ICD-10-CM | POA: Diagnosis not present

## 2022-10-11 LAB — CUP PACEART REMOTE DEVICE CHECK
Battery Remaining Longevity: 3 mo — CL
Battery Remaining Percentage: 1 %
Brady Statistic RA Percent Paced: 2 %
Brady Statistic RV Percent Paced: 0 %
Date Time Interrogation Session: 20240822023000
Implantable Lead Connection Status: 753985
Implantable Lead Connection Status: 753985
Implantable Lead Implant Date: 20170315
Implantable Lead Implant Date: 20170315
Implantable Lead Location: 753859
Implantable Lead Location: 753860
Implantable Lead Model: 7741
Implantable Lead Model: 7742
Implantable Lead Serial Number: 707950
Implantable Lead Serial Number: 749257
Implantable Pulse Generator Implant Date: 20170315
Lead Channel Impedance Value: 559 Ohm
Lead Channel Impedance Value: 666 Ohm
Lead Channel Setting Pacing Amplitude: 2 V
Lead Channel Setting Pacing Amplitude: 2 V
Lead Channel Setting Pacing Pulse Width: 0.4 ms
Lead Channel Setting Sensing Sensitivity: 2.5 mV
Pulse Gen Serial Number: 718057
Zone Setting Status: 755011

## 2022-10-12 ENCOUNTER — Encounter (INDEPENDENT_AMBULATORY_CARE_PROVIDER_SITE_OTHER): Payer: Self-pay | Admitting: Vascular Surgery

## 2022-10-12 ENCOUNTER — Ambulatory Visit (INDEPENDENT_AMBULATORY_CARE_PROVIDER_SITE_OTHER): Payer: MEDICAID | Admitting: Vascular Surgery

## 2022-10-12 VITALS — BP 150/82 | HR 74 | Resp 18 | Wt 313.4 lb

## 2022-10-12 DIAGNOSIS — I10 Essential (primary) hypertension: Secondary | ICD-10-CM

## 2022-10-12 DIAGNOSIS — E785 Hyperlipidemia, unspecified: Secondary | ICD-10-CM

## 2022-10-12 DIAGNOSIS — I890 Lymphedema, not elsewhere classified: Secondary | ICD-10-CM

## 2022-10-12 DIAGNOSIS — L97211 Non-pressure chronic ulcer of right calf limited to breakdown of skin: Secondary | ICD-10-CM | POA: Diagnosis not present

## 2022-10-12 NOTE — Progress Notes (Unsigned)
Patient ID: Seth Tucker, male   DOB: [birth year], 57 y.o.   MRN: [id number]  Chief Complaint  Patient presents with   New Patient (Initial Visit)    Ref Marvis Moeller consult rle lymphedema    HPI [name] is a 57 y.o. male.  I am asked to see the patient by Dr. Marvis Moeller for evaluation of marked right leg swelling and ulceration.  He had a major trauma to that right leg about 20 years ago.  It sounds like there was possibility of DVT at that time as well.  This leg remains swollen almost all the time but is much severe recently.  He denies any fevers or chills.  He has a small ulceration on the lateral aspect of the right lower leg.  There are marked stasis dermatitis changes and severe hyperpigmentation changes.  He had a previous arterial study in our office about a year ago which was normal.  He has also had 2 negative DVT studies relatively recently.     Past Medical History:  Diagnosis Date   CAD (coronary artery disease)    a. 04/2015 Cath: LM nl, LAD 10ost/p, RI nl, LCX 10p, OM1/2/3 nl, RCA nl. Nl LV fxn.   Chronic heart failure with preserved ejection fraction (HFpEF) (HCC)    a 03/2021 Echo: EF 60-65%, no rwma, mild LVH, nl RV fxn, mild MR, Ao sclerosis.   Depression    Hyperlipidemia    LVH (left ventricular hypertrophy)    Seizure (HCC)    Sleep apnea    SSS (sick sinus syndrome) (HCC)    a. 04/2015 s/p BSX DC PPM (ser #[id number]).   Vitamin D deficiency     Past Surgical History:  Procedure Laterality Date   CARDIAC CATHETERIZATION N/A 05/04/2015   Procedure: Left Heart Cath and Coronary Angiography;  Surgeon: Iran Ouch, MD;  Location: [facility] INVASIVE CV LAB;  Service: Cardiovascular;  Laterality: N/A;   EP IMPLANTABLE DEVICE N/A 05/04/2015   Procedure: Pacemaker Implant;  Surgeon: Marinus Maw, MD;  Location: [facility] INVASIVE CV LAB;  Service: Cardiovascular;  Laterality: N/A;     Family History  Problem Relation Age of Onset   Hypertension Mother    Emphysema Mother     Diabetes Other        sibling   Heart disease Other        sibling   Hypertension Brother       Social History   Tobacco Use   Smoking status: Every Day    Current packs/day: 0.50    Average packs/day: 0.5 packs/day for 20.0 years (10.0 ttl pk-yrs)    Types: Cigarettes   Smokeless tobacco: Never   Tobacco comments:    6 a day.  Vaping Use   Vaping status: Never Used  Substance Use Topics   Alcohol use: No   Drug use: No     Allergies  Allergen Reactions   Bee Venom Anaphylaxis and Swelling   Hornet Venom Shortness Of Breath and Swelling   Influenza Vaccines Other (See Comments)    Seizures    Peanuts [Peanut Oil] Anaphylaxis   Phenytoin Hives and Rash   Valproic Acid Hives and Rash   Lamotrigine Rash    Current Outpatient Medications  Medication Sig Dispense Refill   albuterol (PROVENTIL HFA;VENTOLIN HFA) 108 (90 Base) MCG/ACT inhaler Inhale 2 puffs into the lungs every 4 (four) hours as needed for wheezing or shortness of breath.     amoxicillin-clavulanate (AUGMENTIN)  875-125 MG tablet Take 1 tablet by mouth 2 (two) times daily.     apixaban (ELIQUIS) 5 MG TABS tablet Take 1 tablet (5 mg total) by mouth 2 (two) times daily. 90 tablet 7   APIXABAN (ELIQUIS) VTE STARTER PACK (10MG  AND 5MG ) Take as directed on package: start with two-5mg  tablets twice daily for 7 days. On day 8, switch to one-5mg  tablet twice daily. 1 each 0   aspirin EC 81 MG EC tablet Take 1 tablet (81 mg total) by mouth daily.     atorvastatin (LIPITOR) 40 MG tablet Take 40 mg by mouth daily.     carvedilol (COREG) 6.25 MG tablet Take 1 tablet (6.25 mg total) by mouth 2 (two) times daily with a meal. 120 tablet 0   cholecalciferol (VITAMIN D) 1000 units tablet Take 1,000 Units by mouth daily.     Fluticasone-Salmeterol (ADVAIR) 250-50 MCG/DOSE AEPB Inhale 1 puff into the lungs 2 (two) times daily.     furosemide (LASIX) 40 MG tablet Take 1.5 tablets (60 mg total) by mouth daily. 90 tablet 7    ketoconazole (NIZORAL) 2 % cream Apply to both feet and between toes once daily for 6 weeks. 60 g 1   losartan (COZAAR) 50 MG tablet Take 1 tablet (50 mg total) by mouth daily. 30 tablet 0   nitroGLYCERIN (NITROSTAT) 0.4 MG SL tablet Place 1 tablet (0.4 mg total) under the tongue every 5 (five) minutes as needed for chest pain. 25 tablet 3   oxyCODONE-acetaminophen (PERCOCET/ROXICET) 5-325 MG tablet Take 1 tablet by mouth every 6 (six) hours as needed for moderate pain. 12 tablet 0   PHENObarbital (LUMINAL) 100 MG tablet Take 200 mg by mouth at bedtime.      No current facility-administered medications for this visit.      REVIEW OF SYSTEMS (Negative unless checked)  Constitutional: [] Weight loss  [] Fever  [] Chills Cardiac: [] Chest pain   [] Chest pressure   [] Palpitations   [] Shortness of breath when laying flat   [] Shortness of breath at rest   [] Shortness of breath with exertion. Vascular:  [] Pain in legs with walking   [] Pain in legs at rest   [] Pain in legs when laying flat   [] Claudication   [] Pain in feet when walking  [] Pain in feet at rest  [] Pain in feet when laying flat   [x] History of DVT   [] Phlebitis   [x] Swelling in legs   [] Varicose veins   [x] Non-healing ulcers Pulmonary:   [] Uses home oxygen   [] Productive cough   [] Hemoptysis   [] Wheeze  [] COPD   [] Asthma Neurologic:  [] Dizziness  [] Blackouts   [x] Seizures   [] History of stroke   [] History of TIA  [] Aphasia   [] Temporary blindness   [] Dysphagia   [] Weakness or numbness in arms   [] Weakness or numbness in legs Musculoskeletal:  [] Arthritis   [] Joint swelling   [x] Joint pain   [] Low back pain Hematologic:  [] Easy bruising  [] Easy bleeding   [] Hypercoagulable state   [] Anemic  [] Hepatitis Gastrointestinal:  [] Blood in stool   [] Vomiting blood  [] Gastroesophageal reflux/heartburn   [] Abdominal pain Genitourinary:  [] Chronic kidney disease   [] Difficult urination  [] Frequent urination  [] Burning with urination   [] Hematuria Skin:   [] Rashes   [x] Ulcers   [x] Wounds Psychological:  [] History of anxiety   []  History of major depression.    Physical Exam There were no vitals taken for this visit. Gen:  WD/WN, NAD. Obese  Head: [place]/AT, No temporalis wasting.  Ear/Nose/Throat: Hearing grossly intact, nares w/o erythema or drainage, oropharynx w/o Erythema/Exudate Eyes: Conjunctiva clear, sclera non-icteric  Neck: trachea midline.  No JVD.  Pulmonary:  Good air movement, respirations not labored, no use of accessory muscles  Cardiac: RRR, no JVD Vascular:  Vessel Right Left  Radial Palpable Palpable                          DP NP Palpable   PT NP NP   Gastrointestinal:. No masses, surgical incisions, or scars. Musculoskeletal: M/S 5/5 throughout.  Extremities without ischemic changes.  No deformity or atrophy.  Small superficial ulceration with some fibrinous exudate on the lateral aspect of the right lower leg.  Marked stasis dermatitis changes and severe hyperpigmentation is present on the right leg.  3-4+ right lower extremity edema with 1+ left lower extremity edema. Neurologic: Sensation grossly intact in extremities.  Symmetrical.  Speech is fluent. Motor exam as listed above. Psychiatric: Judgment intact, Mood & affect appropriate for pt's clinical situation. Dermatologic: right leg as above    Radiology CUP PACEART REMOTE DEVICE CHECK  Result Date: 10/11/2022 Scheduled remote reviewed. Normal device function.  The device estimates less than 3 months until ERI Next remote 11/12/2022. Hassell Halim, RN, CCDS, [employer]  CUP PACEART REMOTE DEVICE CHECK  Result Date: 09/13/2022 Scheduled remote reviewed. Normal device function.  The device estimates less than 3 months until ERI There was a one second atrial arrhythmia detected Next remote 10/11/2022. Hassell Halim, RN, CCDS, [employer]   Labs Recent Results (from the past 2160 hour(s))  CUP PACEART REMOTE DEVICE CHECK     Status: None    Collection Time: 08/09/22  2:57 AM  Result Value Ref Range   Date Time Interrogation Session [id number]    Pulse Generator Manufacturer BOST    Pulse Gen Model L331 ACCOLADE MRI EL    Pulse Gen Serial Number [id number]    Clinic Name [facility]    Implantable Pulse Generator Type Implantable Pulse Generator    Implantable Pulse Generator Implant Date 04540981    Implantable Lead Manufacturer BOST    Implantable Lead Model 7741 Ingevity MRI    Implantable Lead Serial Number [id number]    Implantable Lead Implant Date 19147829    Implantable Lead Location Detail 1 UNKNOWN    Implantable Lead Location [id number]    Implantable Lead Connection Status L088196    Implantable Lead Manufacturer BOST    Implantable Lead Model 7742 Ingevity MRI    Implantable Lead Serial Number [id number]    Implantable Lead Implant Date 56213086    Implantable Lead Location Detail 1 APEX    Implantable Lead Location [id number]    Implantable Lead Connection Status L088196    Lead Channel Setting Sensing Sensitivity 2.5 mV   Lead Channel Setting Sensing Adaptation Mode Fixed Pacing    Lead Channel Setting Pacing Amplitude 2.0 V   Lead Channel Setting Pacing Pulse Width 0.4 ms   Lead Channel Setting Pacing Amplitude 2.0 V   Zone Setting Status 755011    Lead Channel Impedance Value 551 ohm   Lead Channel Impedance Value 668 ohm   Battery Status MOS    Battery Remaining Longevity 3 mo   Battery Remaining Percentage 3 %   Brady Statistic RA Percent Paced 1 %   Brady Statistic RV Percent Paced 0 %  CBC with Differential     Status: Abnormal   Collection Time: 08/21/22  3:34 PM  Result Value Ref Range   WBC 10.6 (H) 4.0 - 10.5 K/uL   RBC 5.04 4.22 - 5.81 MIL/uL   Hemoglobin 14.5 13.0 - 17.0 g/dL   HCT 98.1 19.1 - 47.8 %   MCV 87.9 80.0 - 100.0 fL   MCH 28.8 26.0 - 34.0 pg   MCHC 32.7 30.0 - 36.0 g/dL   RDW 29.5 62.1 - 30.8 %   Platelets 254 150 - 400 K/uL   nRBC 0.0 0.0 - 0.2 %   Neutrophils Relative % 60 %    Neutro Abs 6.3 1.7 - 7.7 K/uL   Lymphocytes Relative 31 %   Lymphs Abs 3.3 0.7 - 4.0 K/uL   Monocytes Relative 7 %   Monocytes Absolute 0.7 0.1 - 1.0 K/uL   Eosinophils Relative 2 %   Eosinophils Absolute 0.2 0.0 - 0.5 K/uL   Basophils Relative 0 %   Basophils Absolute 0.0 0.0 - 0.1 K/uL   Immature Granulocytes 0 %   Abs Immature Granulocytes 0.03 0.00 - 0.07 K/uL    Comment: Performed at [facility], [address] [place], [place] [address]  Comprehensive metabolic panel     Status: Abnormal   Collection Time: 08/21/22  3:34 PM  Result Value Ref Range   Sodium 137 135 - 145 mmol/L   Potassium 4.0 3.5 - 5.1 mmol/L   Chloride 104 98 - 111 mmol/L   CO2 25 22 - 32 mmol/L   Glucose, Bld 119 (H) 70 - 99 mg/dL    Comment: Glucose reference range applies only to samples taken after fasting for at least 8 hours.   BUN 13 6 - 20 mg/dL   Creatinine, Ser 6.96 0.61 - 1.24 mg/dL   Calcium 9.0 8.9 - 29.5 mg/dL   Total Protein 7.4 6.5 - 8.1 g/dL   Albumin 3.9 3.5 - 5.0 g/dL   AST 18 15 - 41 U/L   ALT 21 0 - 44 U/L   Alkaline Phosphatase 75 38 - 126 U/L   Total Bilirubin 0.6 0.3 - 1.2 mg/dL   GFR, Estimated >28 >41 mL/min    Comment: (NOTE) Calculated using the CKD-EPI Creatinine Equation (2021)    Anion gap 8 5 - 15    Comment: Performed at [facility], [address] [place], [place] [address]  CUP PACEART REMOTE DEVICE CHECK     Status: Abnormal   Collection Time: 09/13/22  2:32 AM  Result Value Ref Range   Date Time Interrogation Session [phone]    Pulse Generator Manufacturer BOST    Pulse Gen Model L331 ACCOLADE MRI EL    Pulse Gen Serial Number [id number]    Clinic Name [facility]    Implantable Pulse Generator Type Implantable Pulse Generator    Implantable Pulse Generator Implant Date [id number]    Implantable Lead Manufacturer BOST    Implantable Lead Model 7741 Ingevity MRI    Implantable Lead Serial Number [id number]    Implantable Lead Implant  Date 32951884    Implantable Lead Location Detail 1 UNKNOWN    Implantable Lead Location [id number]    Implantable Lead Connection Status L088196    Implantable Lead Manufacturer BOST    Implantable Lead Model 7742 Ingevity MRI    Implantable Lead Serial Number [id number]    Implantable Lead Implant Date 16606301    Implantable Lead Location Detail 1 APEX    Implantable Lead Location [id number]    Implantable Lead Connection Status L088196  Lead Channel Setting Sensing Sensitivity 2.5 mV   Lead Channel Setting Sensing Adaptation Mode Fixed Pacing    Lead Channel Setting Pacing Amplitude 2.0 V   Lead Channel Setting Pacing Pulse Width 0.4 ms   Lead Channel Setting Pacing Amplitude 2.0 V   Zone Setting Status 755011    Lead Channel Impedance Value 550 ohm   Lead Channel Impedance Value 664 ohm   Battery Status MOS    Battery Remaining Longevity 3 (<) mo   Battery Remaining Percentage 2 %   Brady Statistic RA Percent Paced 2 %   Brady Statistic RV Percent Paced 0 %  CUP PACEART REMOTE DEVICE CHECK     Status: Abnormal   Collection Time: 10/11/22  2:30 AM  Result Value Ref Range   Date Time Interrogation Session [id number]    Pulse Generator Manufacturer BOST    Pulse Gen Model L331 ACCOLADE MRI EL    Pulse Gen Serial Number [id number]    Clinic Name [facility]    Implantable Pulse Generator Type Implantable Pulse Generator    Implantable Pulse Generator Implant Date 16109604    Implantable Lead Manufacturer BOST    Implantable Lead Model 7741 Ingevity MRI    Implantable Lead Serial Number [id number]    Implantable Lead Implant Date 54098119    Implantable Lead Location Detail 1 UNKNOWN    Implantable Lead Location [id number]    Implantable Lead Connection Status L088196    Implantable Lead Manufacturer BOST    Implantable Lead Model 7742 Ingevity MRI    Implantable Lead Serial Number [id number]    Implantable Lead Implant Date 14782956    Implantable Lead Location Detail 1 APEX    Implantable  Lead Location [id number]    Implantable Lead Connection Status [phone]    Lead Channel Setting Sensing Sensitivity 2.5 mV   Lead Channel Setting Sensing Adaptation Mode Fixed Pacing    Lead Channel Setting Pacing Amplitude 2.0 V   Lead Channel Setting Pacing Pulse Width 0.4 ms   Lead Channel Setting Pacing Amplitude 2.0 V   Zone Setting Status 755011    Lead Channel Impedance Value 559 ohm   Lead Channel Impedance Value 666 ohm   Battery Status MOS    Battery Remaining Longevity 3 (<) mo   Battery Remaining Percentage 1 %   Brady Statistic RA Percent Paced 2 %   Brady Statistic RV Percent Paced 0 %   Eval Rhythm SR at 80 bpm     Assessment/Plan:  No problem-specific Assessment & Plan notes found for this encounter.      Festus Barren 10/12/2022, 9:31 AM   This note was created with Dragon medical transcription system.  Any errors from dictation are unintentional.

## 2022-10-12 NOTE — Assessment & Plan Note (Signed)
The patient has severe, stage III lymphedema with swelling refractory to elevation and compression.  His swelling has been longstanding.  I would recommend a venous reflux study to look for significant venous reflux.  I would recommend placing him in a 3 layer Unna boot today which was done.  This will be changed weekly.  Will reassess him in 3 to 4 weeks about coming out of the Unna boot and going into compression socks.

## 2022-10-15 DIAGNOSIS — L97219 Non-pressure chronic ulcer of right calf with unspecified severity: Secondary | ICD-10-CM | POA: Insufficient documentation

## 2022-10-15 NOTE — Assessment & Plan Note (Signed)
lipid control important in reducing the progression of atherosclerotic disease. Continue statin therapy  

## 2022-10-15 NOTE — Patient Instructions (Signed)
Edema  Edema is an abnormal buildup of fluids in the body tissues and under the skin. Swelling of the legs, feet, and ankles is a common symptom that becomes more likely as you get older. Swelling is also common in looser tissues, such as around the eyes. Pressing on the area may make a temporary dent in your skin (pitting edema). This fluid may also accumulate in your lungs (pulmonary edema). There are many possible causes of edema. Eating too much salt (sodium) and being on your feet or sitting for a long time can cause edema in your legs, feet, and ankles. Common causes of edema include: Certain medical conditions, such as heart failure, liver or kidney disease, and cancer. Weak leg blood vessels. An injury. Pregnancy. Medicines. Being obese. Low protein levels in the blood. Hot weather may make edema worse. Edema is usually painless. Your skin may look swollen or shiny. Follow these instructions at home: Medicines Take over-the-counter and prescription medicines only as told by your health care provider. Your health care provider may prescribe a medicine to help your body get rid of extra water (diuretic). Take this medicine if you are told to take it. Eating and drinking Eat a low-salt (low-sodium) diet to reduce fluid as told by your health care provider. Sometimes, eating less salt may reduce swelling. Depending on the cause of your swelling, you may need to limit how much fluid you drink (fluid restriction). General instructions Raise (elevate) the injured area above the level of your heart while you are sitting or lying down. Do not sit still or stand for long periods of time. Do not wear tight clothing. Do not wear garters on your upper legs. Exercise your legs to get your circulation going. This helps to move the fluid back into your blood vessels, and it may help the swelling go down. Wear compression stockings as told by your health care provider. These stockings help to prevent  blood clots and reduce swelling in your legs. It is important that these are the correct size. These stockings should be prescribed by your health care provider to prevent possible injuries. If elastic bandages or wraps are recommended, use them as told by your health care provider. Contact a health care provider if: Your edema does not get better with treatment. You have heart, liver, or kidney disease and have symptoms of edema. You have sudden and unexplained weight gain. Get help right away if: You develop shortness of breath or chest pain. You cannot breathe when you lie down. You develop pain, redness, or warmth in the swollen areas. You have heart, liver, or kidney disease and suddenly get edema. You have a fever and your symptoms suddenly get worse. These symptoms may be an emergency. Get help right away. Call 911. Do not wait to see if the symptoms will go away. Do not drive yourself to the hospital. Summary Edema is an abnormal buildup of fluids in the body tissues and under the skin. Eating too much salt (sodium)and being on your feet or sitting for a long time can cause edema in your legs, feet, and ankles. Raise (elevate) the injured area above the level of your heart while you are sitting or lying down. Follow your health care provider's instructions about diet and how much fluid you can drink. This information is not intended to replace advice given to you by your health care provider. Make sure you discuss any questions you have with your health care provider. Document Revised: 10/10/2020 Document   Reviewed: 10/10/2020 Elsevier Patient Education  2024 [employer]vier Inc.  

## 2022-10-15 NOTE — Assessment & Plan Note (Signed)
The patient has a superficial ulceration of the right lower leg associated with marked leg swelling.  He will need an Unna boot placed and we placed a 3 layer Unna boot on the right lower extremity today.  We will plan on changing this weekly for 3 to 4 weeks and then reassessing the wound in hopes of getting out of Unna boots and into compression socks.

## 2022-10-15 NOTE — Assessment & Plan Note (Signed)
blood pressure control important in reducing the progression of atherosclerotic disease. On appropriate oral medications.  

## 2022-10-18 ENCOUNTER — Ambulatory Visit (INDEPENDENT_AMBULATORY_CARE_PROVIDER_SITE_OTHER): Payer: MEDICAID | Admitting: Vascular Surgery

## 2022-10-18 ENCOUNTER — Encounter (INDEPENDENT_AMBULATORY_CARE_PROVIDER_SITE_OTHER): Payer: Self-pay

## 2022-10-18 VITALS — BP 149/85 | HR 67 | Resp 18 | Ht 69.0 in | Wt 313.0 lb

## 2022-10-18 DIAGNOSIS — L97909 Non-pressure chronic ulcer of unspecified part of unspecified lower leg with unspecified severity: Secondary | ICD-10-CM | POA: Diagnosis not present

## 2022-10-18 DIAGNOSIS — I83009 Varicose veins of unspecified lower extremity with ulcer of unspecified site: Secondary | ICD-10-CM | POA: Diagnosis not present

## 2022-10-18 NOTE — Progress Notes (Signed)
History of Present Illness  There is no documented history at this time  Assessments & Plan   There are no diagnoses linked to this encounter.    Additional instructions  Subjective:  Patient presents with venous ulcer of the Bilateral lower extremity.    Procedure:  3 layer unna wrap was placed Bilateral lower extremity.   Plan:   Follow up in one week.  

## 2022-10-19 NOTE — Progress Notes (Signed)
  Subjective:  Patient ID: Seth Tucker, male    DOB: 1965-11-29,  MRN: [id number]  Seth Tucker presents to clinic today for at risk foot care with h/o clotting disorder and painful thick toenails that are difficult to trim. Pain interferes with ambulation. Aggravating factors include wearing enclosed shoe gear. Pain is relieved with periodic professional debridement. Patient states he has not been applying prescribed antifungal cream consistently as instructed. Chief Complaint  Patient presents with   Nail Problem    RFC,Referring Provider [facility],lov:2 weeks ago      New problem(s): None.   PCP is [facility]  Allergies  Allergen Reactions   Bee Venom Anaphylaxis and Swelling   Hornet Venom Shortness Of Breath and Swelling   Influenza Vaccines Other (See Comments)    Seizures    Peanuts [Peanut Oil] Anaphylaxis   Phenytoin Hives and Rash   Valproic Acid Hives and Rash   Lamotrigine Rash    Review of Systems: Negative except as noted in the HPI.  Objective: No changes noted in today's physical examination. There were no vitals filed for this visit. Seth Tucker is a pleasant 57 y.o. male morbidly obese in NAD. AAO x 3.  Vascular Examination: Vascular status intact b/l with palpable pedal pulses. Pedal hair absent b/l. CFT immediate b/l. No pain with calf compression b/l. Skin temperature gradient WNL b/l. Nonpitting edema noted b/l lower extremities right >left. Evidence of chronic venous insufficiency b/l LE.  Neurological Examination: Sensation grossly intact b/l with 10 gram monofilament. Vibratory sensation intact b/l.   Dermatological Examination: Pedal skin with normal turgor, texture and tone b/l. Toenails 1-5 b/l thick, discolored, elongated with subungual debris and pain on dorsal palpation. No hyperkeratotic lesions noted b/l.   Diffuse scaling noted peripherally and plantarly b/l feet.  No  interdigital macerations.  No blisters, no weeping. No signs of secondary bacterial infection noted.  Musculoskeletal Examination: Muscle strength 5/5 to b/l LE. No pain, crepitus or joint limitation noted with ROM bilateral LE. Pes planus deformity noted bilateral LE.  Radiographs: None  Assessment/Plan: 1. Pain due to onychomycosis of toenails of both feet   2. Tinea pedis of both feet    -Consent given for treatment as described below: -Examined patient. -Patient to continue soft, supportive shoe gear daily. -Mycotic toenails 1-5 bilaterally were debrided in length and girth with sterile nail nippers and dremel without incident. -Discussed need for consistent application of Ketoconazole Cream to be applied once daily for 6 weeks. Patient related understanding. -Patient/POA to call should there be question/concern in the interim.   Return in about 3 months (around 01/11/2023).  Freddie Breech, DPM

## 2022-10-21 ENCOUNTER — Encounter (INDEPENDENT_AMBULATORY_CARE_PROVIDER_SITE_OTHER): Payer: Self-pay | Admitting: Vascular Surgery

## 2022-10-21 DIAGNOSIS — L97909 Non-pressure chronic ulcer of unspecified part of unspecified lower leg with unspecified severity: Secondary | ICD-10-CM | POA: Insufficient documentation

## 2022-10-23 NOTE — Progress Notes (Signed)
Remote pacemaker transmission.   

## 2022-10-24 ENCOUNTER — Emergency Department: Payer: MEDICAID

## 2022-10-24 ENCOUNTER — Encounter: Payer: Self-pay | Admitting: Emergency Medicine

## 2022-10-24 ENCOUNTER — Other Ambulatory Visit: Payer: Self-pay

## 2022-10-24 ENCOUNTER — Emergency Department
Admission: EM | Admit: 2022-10-24 | Discharge: 2022-10-24 | Disposition: A | Discharge status: Home / Self Care | Payer: MEDICAID | Attending: Emergency Medicine | Admitting: Emergency Medicine

## 2022-10-24 DIAGNOSIS — I2510 Atherosclerotic heart disease of native coronary artery without angina pectoris: Secondary | ICD-10-CM | POA: Diagnosis not present

## 2022-10-24 DIAGNOSIS — T63441A Toxic effect of venom of bees, accidental (unintentional), initial encounter: Secondary | ICD-10-CM | POA: Insufficient documentation

## 2022-10-24 DIAGNOSIS — I10 Essential (primary) hypertension: Secondary | ICD-10-CM | POA: Insufficient documentation

## 2022-10-24 DIAGNOSIS — R0789 Other chest pain: Secondary | ICD-10-CM | POA: Diagnosis present

## 2022-10-24 LAB — BASIC METABOLIC PANEL
Anion gap: 9 (ref 5–15)
BUN: 7 mg/dL (ref 6–20)
CO2: 22 mmol/L (ref 22–32)
Calcium: 8.7 mg/dL — ABNORMAL LOW (ref 8.9–10.3)
Chloride: 105 mmol/L (ref 98–111)
Creatinine, Ser: 0.61 mg/dL (ref 0.61–1.24)
GFR, Estimated: >60 mL/min (ref >60)
Glucose, Bld: 108 mg/dL — ABNORMAL HIGH (ref 70–99)
Potassium: 3.9 mmol/L (ref 3.5–5.1)
Sodium: 136 mmol/L (ref 135–145)

## 2022-10-24 LAB — CBC
HCT: 44.6 % (ref 39.0–52.0)
Hemoglobin: 14.6 g/dL (ref 13.0–17.0)
MCH: 29.1 pg (ref 26.0–34.0)
MCHC: 32.7 g/dL (ref 30.0–36.0)
MCV: 89 fL (ref 80.0–100.0)
Platelets: 241 10*3/uL (ref 150–400)
RBC: 5.01 MIL/uL (ref 4.22–5.81)
RDW: 14.1 % (ref 11.5–15.5)
WBC: 11.6 10*3/uL — ABNORMAL HIGH (ref 4.0–10.5)
nRBC: 0 % (ref 0.0–0.2)

## 2022-10-24 LAB — TROPONIN I (HIGH SENSITIVITY): Troponin I (High Sensitivity): 12 ng/L (ref <18)

## 2022-10-24 LAB — PROTIME-INR
INR: 1 (ref 0.8–1.2)
Prothrombin Time: 13.9 s (ref 11.4–15.2)

## 2022-10-24 MED ORDER — PREDNISONE 20 MG PO TABS: 60.0000 mg | ORAL_TABLET | Freq: Every day | ORAL | 0 refills | Status: AC

## 2022-10-24 MED ORDER — EPINEPHRINE 0.3 MG/0.3ML IJ SOAJ: 0.3000 mg | INTRAMUSCULAR | 3 refills | Status: DC | PRN

## 2022-10-24 MED ORDER — DIPHENHYDRAMINE HCL 25 MG PO CAPS
50.0000 mg | ORAL_CAPSULE | Freq: Once | ORAL | Status: AC
  Administered 2022-10-24: 50 mg via ORAL
  Filled 2022-10-24: qty 2

## 2022-10-24 MED ORDER — FAMOTIDINE 20 MG PO TABS
40.0000 mg | ORAL_TABLET | Freq: Once | ORAL | Status: AC
  Administered 2022-10-24: 40 mg via ORAL
  Filled 2022-10-24: qty 2

## 2022-10-24 MED ORDER — PREDNISONE 20 MG PO TABS
60.0000 mg | ORAL_TABLET | Freq: Once | ORAL | Status: AC
  Administered 2022-10-24: 60 mg via ORAL
  Filled 2022-10-24: qty 3

## 2022-10-24 NOTE — ED Provider Notes (Signed)
[facility] Provider Note    Event Date/Time   First MD Initiated Contact with Patient 10/24/22 2001     (approximate)   History   Chief Complaint: Chest Pain   HPI  Cortez Hakeim Muse is a 57 y.o. male with history of seizures, CAD, obesity who comes ED complaining of itching and swelling of the right forearm and hand after receiving 2 yellowjacket stings to the area yesterday.  He has a known allergy to bee stings.  Denies chest pain shortness of breath difficulty swallowing or tongue swelling.  Has not taken any medicine so far.  Denies any current chest pain, states that he has a feeling of chest tightness that he attributes to the bee sting.  No exertional symptoms.     Physical Exam   Triage Vital Signs: ED Triage Vitals  Encounter Vitals Group     BP 10/24/22 1704 (!) 182/78     Systolic BP Percentile --      Diastolic BP Percentile --      Pulse Rate 10/24/22 1704 71     Resp 10/24/22 1704 18     Temp 10/24/22 1704 (!) 97.5 F (36.4 C)     Temp Source 10/24/22 1704 Oral     SpO2 10/24/22 1704 96 %     Weight 10/24/22 1705 300 lb (136.1 kg)     Height 10/24/22 1705 5\' 10"  (1.778 m)     Head Circumference --      Peak Flow --      Pain Score 10/24/22 1705 9     Pain Loc --      Pain Education --      Exclude from Growth Chart --     Most recent vital signs: Vitals:   10/24/22 1937 10/24/22 1945  BP: (!) 151/73   Pulse: (!) 52 (!) 54  Resp: 18 (!) 21  Temp:    SpO2: 95%     General: Awake, no distress.  CV:  Good peripheral perfusion.  Regular rate and rhythm, normal distal pulses Resp:  Normal effort.  Clear to auscultation bilaterally Abd:  No distention.  Soft nontender Other:  Oropharynx normal.  No tongue swelling.  Floor mouth soft.  There is edema of the right forearm and hand.  Compartments are soft.  No embedded stingers or other foreign body.   ED Results / Procedures / Treatments   Labs (all labs ordered are listed,  but only abnormal results are displayed) Labs Reviewed  BASIC METABOLIC PANEL - Abnormal; Notable for the following components:      Result Value   Glucose, Bld 108 (*)    Calcium 8.7 (*)    All other components within normal limits  CBC - Abnormal; Notable for the following components:   WBC 11.6 (*)    All other components within normal limits  PROTIME-INR  TROPONIN I (HIGH SENSITIVITY)  TROPONIN I (HIGH SENSITIVITY)     EKG Interpreted by me Sinus rhythm rate of 69.  Normal axis, normal intervals.  Normal QRS ST segments and T waves.  No ischemic changes.   RADIOLOGY Chest x-ray interpreted by me, appears normal.  Radiology report reviewed.   PROCEDURES:  Procedures   MEDICATIONS ORDERED IN ED: Medications  predniSONE (DELTASONE) tablet 60 mg (has no administration in time range)  diphenhydrAMINE (BENADRYL) capsule 50 mg (has no administration in time range)  famotidine (PEPCID) tablet 40 mg (has no administration in time range)  IMPRESSION / MDM / ASSESSMENT AND PLAN / ED COURSE  I reviewed the triage vital signs and the nursing notes.  DDx: Allergic reaction, pulmonary edema, pleural effusion, NSTEMI  Patient's presentation is most consistent with acute presentation with potential threat to life or bodily function.  Patient presents with atypical chest tightness symptoms related to allergic reaction to bee sting that started yesterday.  With symptoms ongoing for greater than 24 hours and clear lungs and no upper airway edema, presentation is not consistent with anaphylaxis.  Vital signs are essentially normal.  Will start on Benadryl and prednisone.  Labs are all unremarkable, EKG chest x-ray unremarkable.  Stable for discharge.  Return precautions discussed.       FINAL CLINICAL IMPRESSION(S) / ED DIAGNOSES   Final diagnoses:  Allergic reaction to bee sting     Rx / DC Orders   ED Discharge Orders          Ordered    predniSONE (DELTASONE) 20 MG  tablet  Daily with breakfast        10/24/22 2019    EPINEPHrine 0.3 mg/0.3 mL IJ SOAJ injection  As needed        10/24/22 2019             Note:  This document was prepared using Dragon voice recognition software and may include unintentional dictation errors.   Sharman Cheek, MD 10/24/22 2110

## 2022-10-24 NOTE — ED Triage Notes (Signed)
Patient to ED via POV for mid sternal CP that started this afternoon. Does not radiate. Hx of defibrillator. Also got stung by yellow jacket yesterday and have swelling to right arm. HX of HTN but has not been taking meds.

## 2022-10-25 ENCOUNTER — Ambulatory Visit (INDEPENDENT_AMBULATORY_CARE_PROVIDER_SITE_OTHER): Payer: MEDICAID | Admitting: Nurse Practitioner

## 2022-10-25 ENCOUNTER — Encounter (INDEPENDENT_AMBULATORY_CARE_PROVIDER_SITE_OTHER): Payer: Self-pay | Admitting: Nurse Practitioner

## 2022-10-25 VITALS — BP 158/84 | HR 62 | Resp 18 | Ht 70.0 in | Wt 300.0 lb

## 2022-10-25 DIAGNOSIS — L97909 Non-pressure chronic ulcer of unspecified part of unspecified lower leg with unspecified severity: Secondary | ICD-10-CM

## 2022-10-25 DIAGNOSIS — I83009 Varicose veins of unspecified lower extremity with ulcer of unspecified site: Secondary | ICD-10-CM

## 2022-10-25 NOTE — Progress Notes (Signed)
History of Present Illness  There is no documented history at this time  Assessments & Plan   There are no diagnoses linked to this encounter.    Additional instructions  Subjective:  Patient presents with venous ulcer of the Bilateral lower extremity.    Procedure:  3 layer unna wrap was placed Bilateral lower extremity.   Plan:   Follow up in one week.  

## 2022-11-01 ENCOUNTER — Ambulatory Visit (INDEPENDENT_AMBULATORY_CARE_PROVIDER_SITE_OTHER): Payer: MEDICAID | Admitting: Nurse Practitioner

## 2022-11-01 ENCOUNTER — Encounter (INDEPENDENT_AMBULATORY_CARE_PROVIDER_SITE_OTHER): Payer: Self-pay

## 2022-11-01 VITALS — BP 153/91 | HR 69 | Resp 16

## 2022-11-01 DIAGNOSIS — L97909 Non-pressure chronic ulcer of unspecified part of unspecified lower leg with unspecified severity: Secondary | ICD-10-CM | POA: Diagnosis not present

## 2022-11-01 DIAGNOSIS — I83009 Varicose veins of unspecified lower extremity with ulcer of unspecified site: Secondary | ICD-10-CM | POA: Diagnosis not present

## 2022-11-01 NOTE — Progress Notes (Signed)
History of Present Illness  There is no documented history at this time  Assessments & Plan   There are no diagnoses linked to this encounter.    Additional instructions  Subjective:  Patient presents with venous ulcer of the Bilateral lower extremity.    Procedure:  3 layer unna wrap was placed Bilateral lower extremity.   Plan:   Follow up in one week.  

## 2022-11-06 ENCOUNTER — Ambulatory Visit (INDEPENDENT_AMBULATORY_CARE_PROVIDER_SITE_OTHER): Payer: MEDICAID

## 2022-11-06 ENCOUNTER — Encounter (INDEPENDENT_AMBULATORY_CARE_PROVIDER_SITE_OTHER): Payer: Self-pay | Admitting: Nurse Practitioner

## 2022-11-06 ENCOUNTER — Ambulatory Visit (INDEPENDENT_AMBULATORY_CARE_PROVIDER_SITE_OTHER): Payer: MEDICAID | Admitting: Nurse Practitioner

## 2022-11-06 VITALS — BP 144/78 | HR 59 | Resp 18 | Ht 70.0 in | Wt 300.0 lb

## 2022-11-06 DIAGNOSIS — I83009 Varicose veins of unspecified lower extremity with ulcer of unspecified site: Secondary | ICD-10-CM

## 2022-11-06 DIAGNOSIS — L97211 Non-pressure chronic ulcer of right calf limited to breakdown of skin: Secondary | ICD-10-CM | POA: Diagnosis not present

## 2022-11-06 DIAGNOSIS — L97909 Non-pressure chronic ulcer of unspecified part of unspecified lower leg with unspecified severity: Secondary | ICD-10-CM | POA: Diagnosis not present

## 2022-11-06 DIAGNOSIS — I890 Lymphedema, not elsewhere classified: Secondary | ICD-10-CM

## 2022-11-06 DIAGNOSIS — I10 Essential (primary) hypertension: Secondary | ICD-10-CM

## 2022-11-07 NOTE — Progress Notes (Signed)
Subjective:    Patient ID: Seth Tucker, male    DOB: [birth year], 57 y.o.   MRN: [id number] Chief Complaint  Patient presents with   Follow-up    4 week unna wrap also R reflux    Seth Tucker is a 57 year old male who returns today for follow-up evaluation of ulceration that was present on his right calf area.  Today the ulceration present with scabbing but it has decreased in size from the initial presentation.  He has also been very active recently and this will also help with his edema.  He has also bought a new chair that will allow him to elevate his lower extremities much easier.  Overall he is showing progression.    Review of Systems  Cardiovascular:  Positive for leg swelling.  Skin:  Positive for wound.  All other systems reviewed and are negative.      Objective:   Physical Exam Vitals reviewed.  HENT:     Head: Normocephalic.  Cardiovascular:     Rate and Rhythm: Normal rate.  Pulmonary:     Effort: Pulmonary effort is normal.  Musculoskeletal:     Right lower leg: Edema present.  Skin:    General: Skin is warm and dry.  Neurological:     Mental Status: He is alert and oriented to person, place, and time.  Psychiatric:        Mood and Affect: Mood normal.        Behavior: Behavior normal.        Thought Content: Thought content normal.        Judgment: Judgment normal.     BP (!) 144/78 (BP Location: Left Arm)   Pulse (!) 59   Resp 18   Ht 5\' 10"  (1.778 m)   Wt 300 lb (136.1 kg)   BMI 43.05 kg/m   Past Medical History:  Diagnosis Date   CAD (coronary artery disease)    a. 04/2015 Cath: LM nl, LAD 10ost/p, RI nl, LCX 10p, OM1/2/3 nl, RCA nl. Nl LV fxn.   Chronic heart failure with preserved ejection fraction (HFpEF) (HCC)    a 03/2021 Echo: EF 60-65%, no rwma, mild LVH, nl RV fxn, mild MR, Ao sclerosis.   Depression    Hyperlipidemia    LVH (left ventricular hypertrophy)    Seizure (HCC)    Sleep apnea    SSS (sick sinus syndrome) (HCC)     a. 04/2015 s/p BSX DC PPM (ser #[id number]).   Vitamin D deficiency     Social History   Socioeconomic History   Marital status: Single    Spouse name: Not on file   Number of children: Not on file   Years of education: Not on file   Highest education level: Not on file  Occupational History   Not on file  Tobacco Use   Smoking status: Every Day    Current packs/day: 0.50    Average packs/day: 0.5 packs/day for 20.0 years (10.0 ttl pk-yrs)    Types: Cigarettes   Smokeless tobacco: Never   Tobacco comments:    6 a day.  Vaping Use   Vaping status: Never Used  Substance and Sexual Activity   Alcohol use: No   Drug use: No   Sexual activity: Not Currently  Other Topics Concern   Not on file  Social History Narrative   Not on file   Social Determinants of Health   Financial Resource Strain: Not on file  Food Insecurity:  No Food Insecurity (03/28/2022)   Hunger Vital Sign    Worried About Running Out of Food in the Last Year: Never true    Ran Out of Food in the Last Year: Never true  Transportation Needs: No Transportation Needs (03/28/2022)   PRAPARE - Administrator, Civil Service (Medical): No    Lack of Transportation (Non-Medical): No  Physical Activity: Not on file  Stress: Not on file  Social Connections: Not on file  Intimate Partner Violence: Not At Risk (03/28/2022)   Humiliation, Afraid, Rape, and Kick questionnaire    Fear of Current or Ex-Partner: No    Emotionally Abused: No    Physically Abused: No    Sexually Abused: No    Past Surgical History:  Procedure Laterality Date   CARDIAC CATHETERIZATION N/A 05/04/2015   Procedure: Left Heart Cath and Coronary Angiography;  Surgeon: Iran Ouch, MD;  Location: [facility] INVASIVE CV LAB;  Service: Cardiovascular;  Laterality: N/A;   EP IMPLANTABLE DEVICE N/A 05/04/2015   Procedure: Pacemaker Implant;  Surgeon: Marinus Maw, MD;  Location: [facility] INVASIVE CV LAB;  Service: Cardiovascular;  Laterality: N/A;     Family History  Problem Relation Age of Onset   Hypertension Mother    Emphysema Mother    Diabetes Other        sibling   Heart disease Other        sibling   Hypertension Brother     Allergies  Allergen Reactions   Bee Venom Anaphylaxis and Swelling   Hornet Venom Shortness Of Breath and Swelling   Influenza Vaccines Other (See Comments)    Seizures    Peanuts [Peanut Oil] Anaphylaxis   Phenytoin Hives and Rash   Valproic Acid Hives and Rash   Lamotrigine Rash       Latest Ref Rng & Units 10/24/2022    5:08 PM 08/21/2022    3:34 PM 03/29/2022    1:00 AM  CBC  WBC 4.0 - 10.5 K/uL 11.6  10.6  11.7   Hemoglobin 13.0 - 17.0 g/dL 40.9  81.1  91.4   Hematocrit 39.0 - 52.0 % 44.6  44.3  41.0   Platelets 150 - 400 K/uL 241  254  270       CMP     Component Value Date/Time   NA 136 10/24/2022 1708   NA 145 (H) 03/20/2017 1205   K 3.9 10/24/2022 1708   CL 105 10/24/2022 1708   CO2 22 10/24/2022 1708   GLUCOSE 108 (H) 10/24/2022 1708   BUN 7 10/24/2022 1708   BUN 13 03/20/2017 1205   CREATININE 0.61 10/24/2022 1708   CREATININE 0.68 (L) 05/16/2015 1535   CALCIUM 8.7 (L) 10/24/2022 1708   PROT 7.4 08/21/2022 1534   ALBUMIN 3.9 08/21/2022 1534   AST 18 08/21/2022 1534   ALT 21 08/21/2022 1534   ALKPHOS 75 08/21/2022 1534   BILITOT 0.6 08/21/2022 1534   GFRNONAA >60 10/24/2022 1708     No results found.     Assessment & Plan:   1. Venous ulcer (HCC) The ulceration is much improved from where he initially began.  It is much more superficial but he does continue to have a wound based on this we will have the patient continue into the wraps and he is also advised to elevate his lower extremities  2. Lymphedema Had discussion with the patient regarding lymphedema as a relates to postphlebitic symptoms and syndrome.  He has also  had some recent cardiac issues and may also complicate his lower extremity edema he will continue to follow-up with cardiology for  optimal medical management.  3. Essential hypertension Continue antihypertensive medications as already ordered, these medications have been reviewed and there are no changes at this time.  Blood pressure improved upon recheck patient should follow-up with PCP if remains elevated.   Current Outpatient Medications on File Prior to Visit  Medication Sig Dispense Refill   albuterol (PROVENTIL HFA;VENTOLIN HFA) 108 (90 Base) MCG/ACT inhaler Inhale 2 puffs into the lungs every 4 (four) hours as needed for wheezing or shortness of breath.     amoxicillin-clavulanate (AUGMENTIN) 875-125 MG tablet Take 1 tablet by mouth 2 (two) times daily.     apixaban (ELIQUIS) 5 MG TABS tablet Take 1 tablet (5 mg total) by mouth 2 (two) times daily. 90 tablet 7   APIXABAN (ELIQUIS) VTE STARTER PACK (10MG  AND 5MG ) Take as directed on package: start with two-5mg  tablets twice daily for 7 days. On day 8, switch to one-5mg  tablet twice daily. 1 each 0   aspirin EC 81 MG EC tablet Take 1 tablet (81 mg total) by mouth daily.     atorvastatin (LIPITOR) 40 MG tablet Take 40 mg by mouth daily.     carvedilol (COREG) 6.25 MG tablet Take 1 tablet (6.25 mg total) by mouth 2 (two) times daily with a meal. 120 tablet 0   cholecalciferol (VITAMIN D) 1000 units tablet Take 1,000 Units by mouth daily.     EPINEPHrine 0.3 mg/0.3 mL IJ SOAJ injection Inject 0.3 mg into the muscle as needed for anaphylaxis. Follow package instructions as needed for severe allergy or anaphylactic reaction. 2 each 3   Fluticasone-Salmeterol (ADVAIR) 250-50 MCG/DOSE AEPB Inhale 1 puff into the lungs 2 (two) times daily.     furosemide (LASIX) 40 MG tablet Take 1.5 tablets (60 mg total) by mouth daily. 90 tablet 7   ketoconazole (NIZORAL) 2 % cream Apply to both feet and between toes once daily for 6 weeks. 60 g 1   losartan (COZAAR) 50 MG tablet Take 1 tablet (50 mg total) by mouth daily. 30 tablet 0   nitroGLYCERIN (NITROSTAT) 0.4 MG SL tablet Place 1  tablet (0.4 mg total) under the tongue every 5 (five) minutes as needed for chest pain. 25 tablet 3   oxyCODONE-acetaminophen (PERCOCET/ROXICET) 5-325 MG tablet Take 1 tablet by mouth every 6 (six) hours as needed for moderate pain. 12 tablet 0   PHENObarbital (LUMINAL) 100 MG tablet Take 200 mg by mouth at bedtime.      No current facility-administered medications on file prior to visit.    There are no Patient Instructions on file for this visit. No follow-ups on file.   Georgiana Spinner, NP

## 2022-11-12 ENCOUNTER — Ambulatory Visit (INDEPENDENT_AMBULATORY_CARE_PROVIDER_SITE_OTHER): Payer: MEDICAID

## 2022-11-12 DIAGNOSIS — I495 Sick sinus syndrome: Secondary | ICD-10-CM

## 2022-11-14 ENCOUNTER — Encounter (INDEPENDENT_AMBULATORY_CARE_PROVIDER_SITE_OTHER): Payer: Self-pay

## 2022-11-14 ENCOUNTER — Ambulatory Visit (INDEPENDENT_AMBULATORY_CARE_PROVIDER_SITE_OTHER): Payer: MEDICAID | Admitting: Nurse Practitioner

## 2022-11-14 VITALS — BP 152/86 | HR 65 | Resp 18

## 2022-11-14 DIAGNOSIS — L97211 Non-pressure chronic ulcer of right calf limited to breakdown of skin: Secondary | ICD-10-CM | POA: Diagnosis not present

## 2022-11-14 NOTE — Progress Notes (Signed)
History of Present Illness  There is no documented history at this time  Assessments & Plan   There are no diagnoses linked to this encounter.    Additional instructions  Subjective:  Patient presents with venous ulcer of the Bilateral lower extremity.    Procedure:  3 layer unna wrap was placed Bilateral lower extremity.   Plan:   Follow up in one week.  

## 2022-11-15 LAB — CUP PACEART REMOTE DEVICE CHECK
Battery Remaining Longevity: 3 mo — CL
Battery Remaining Percentage: 0 %
Brady Statistic RA Percent Paced: 2 %
Brady Statistic RV Percent Paced: 0 %
Date Time Interrogation Session: 20240926023100
Implantable Lead Connection Status: 753985
Implantable Lead Connection Status: 753985
Implantable Lead Implant Date: 20170315
Implantable Lead Implant Date: 20170315
Implantable Lead Location: 753859
Implantable Lead Location: 753860
Implantable Lead Model: 7741
Implantable Lead Model: 7742
Implantable Lead Serial Number: 707950
Implantable Lead Serial Number: 749257
Implantable Pulse Generator Implant Date: 20170315
Lead Channel Impedance Value: 556 Ohm
Lead Channel Impedance Value: 679 Ohm
Lead Channel Setting Pacing Amplitude: 2 V
Lead Channel Setting Pacing Amplitude: 2 V
Lead Channel Setting Pacing Pulse Width: 0.4 ms
Lead Channel Setting Sensing Sensitivity: 2.5 mV
Pulse Gen Serial Number: 718057
Zone Setting Status: 755011

## 2022-11-16 ENCOUNTER — Telehealth: Payer: Self-pay

## 2022-11-16 NOTE — Telephone Encounter (Signed)
Device reached ERI 9/27. Called mobile phone. VM full. Sent sms notification w/ call back number to device clinic. Will send Mychart message.

## 2022-11-20 NOTE — Telephone Encounter (Signed)
Spoke to patient, advised his device reached ERI 11/16/22. Patient aware of 01/11/23 apt. Routing to April for scheduling.

## 2022-11-21 ENCOUNTER — Encounter (INDEPENDENT_AMBULATORY_CARE_PROVIDER_SITE_OTHER): Payer: MEDICAID

## 2022-11-21 NOTE — Telephone Encounter (Signed)
Pt is scheduled for a PPM Gen Change with Dr. Ladona Ridgel on 12/4.  He is scheduled an office visit with Dr. Ladona Ridgel on 11/22 and will get labs, letter and scrub that day.

## 2022-11-26 ENCOUNTER — Telehealth (INDEPENDENT_AMBULATORY_CARE_PROVIDER_SITE_OTHER): Payer: Self-pay

## 2022-11-26 NOTE — Telephone Encounter (Signed)
Patient called stating he was wondering when his unna wraps would come off because he has had them on for 2 weeks. Patient missed his appt 11/21/2022. Next appt is 11/26/2022 for a unna wrap change.

## 2022-11-27 NOTE — Progress Notes (Signed)
Remote pacemaker transmission.   

## 2022-11-27 NOTE — Addendum Note (Signed)
Addended by: Geralyn Flash D on: 11/27/2022 11:37 AM   Modules accepted: Level of Service

## 2022-11-28 ENCOUNTER — Encounter (INDEPENDENT_AMBULATORY_CARE_PROVIDER_SITE_OTHER): Payer: Self-pay

## 2022-11-28 ENCOUNTER — Ambulatory Visit (INDEPENDENT_AMBULATORY_CARE_PROVIDER_SITE_OTHER): Payer: MEDICAID | Admitting: Nurse Practitioner

## 2022-11-28 VITALS — BP 141/87 | HR 58 | Resp 18

## 2022-11-28 DIAGNOSIS — L97211 Non-pressure chronic ulcer of right calf limited to breakdown of skin: Secondary | ICD-10-CM | POA: Diagnosis not present

## 2022-11-28 NOTE — Progress Notes (Signed)
History of Present Illness  There is no documented history at this time  Assessments & Plan   There are no diagnoses linked to this encounter.    Additional instructions  Subjective:  Patient presents with venous ulcer of the Bilateral lower extremity.    Procedure:  3 layer unna wrap was placed Bilateral lower extremity.   Plan:   Follow up in one week.  Right  lower lateral leg ulcer healed. Lower extremities are still swollen and wanted to keep unna wraps.

## 2022-12-05 ENCOUNTER — Encounter (INDEPENDENT_AMBULATORY_CARE_PROVIDER_SITE_OTHER): Payer: MEDICAID

## 2022-12-07 ENCOUNTER — Telehealth (INDEPENDENT_AMBULATORY_CARE_PROVIDER_SITE_OTHER): Payer: Self-pay

## 2022-12-07 NOTE — Telephone Encounter (Signed)
Patient called about getting unna boots taken off. He missed his appointment 12/05/22. He stated he would cut them off his self and wear his compressions. Patient needed clarification of which compressions to get and when to wear them.  I advised knee high compressions and wear them daily but removal before bed.   Patient stated he wasn't scheduled for a follow up visit 11/28/22 per [facility] request to make sure his legs are doing well.   Please call and schedule patient for a follow up.

## 2022-12-07 NOTE — Telephone Encounter (Signed)
The patient's follow-up visit would have been on 12/05/2022.  At that point in time he would have been scheduled for a follow-up visit to come into the office to evaluate his legs.  Typically when patients have removed Unna boots we have them return in about 4 to 6 weeks to evaluate progress with leg swelling and compression usage.  That said the patient should wear his medical grade compression socks daily.  He should utilize 20 to 30 mmHg compression socks.  They should be worn daily and placed in the morning and remove before bedtime.  He should not sleep in the socks.

## 2022-12-07 NOTE — Telephone Encounter (Signed)
Pt has been scheduled for a follow up with Sheppard Plumber, NP.

## 2022-12-13 ENCOUNTER — Ambulatory Visit (INDEPENDENT_AMBULATORY_CARE_PROVIDER_SITE_OTHER): Payer: MEDICAID

## 2022-12-13 DIAGNOSIS — I495 Sick sinus syndrome: Secondary | ICD-10-CM

## 2022-12-13 LAB — CUP PACEART REMOTE DEVICE CHECK
Brady Statistic RA Percent Paced: 2 %
Brady Statistic RV Percent Paced: 0 %
Date Time Interrogation Session: 20241024023200
Implantable Lead Connection Status: 753985
Implantable Lead Connection Status: 753985
Implantable Lead Implant Date: 20170315
Implantable Lead Implant Date: 20170315
Implantable Lead Location: 753859
Implantable Lead Location: 753860
Implantable Lead Model: 7741
Implantable Lead Model: 7742
Implantable Lead Serial Number: 707950
Implantable Lead Serial Number: 749257
Implantable Pulse Generator Implant Date: 20170315
Lead Channel Impedance Value: 555 Ohm
Lead Channel Impedance Value: 678 Ohm
Lead Channel Setting Pacing Amplitude: 2 V
Lead Channel Setting Pacing Amplitude: 2 V
Lead Channel Setting Pacing Pulse Width: 0.4 ms
Lead Channel Setting Sensing Sensitivity: 2.5 mV
Pulse Gen Serial Number: 718057
Zone Setting Status: 755011

## 2022-12-25 ENCOUNTER — Telehealth: Payer: Self-pay | Admitting: Internal Medicine

## 2022-12-25 NOTE — Telephone Encounter (Signed)
Spoke with the patient's wife and advised that on 11/22 the patient will have an office visit with Dr. Ladona Ridgel to discuss the procedure and get lab work done. The procedure will be done on 12/4. She verbalized understanding.

## 2022-12-25 NOTE — Telephone Encounter (Signed)
Pt's wife would like to know if the appt on 11/22 is going to be replacing the pacemaker or not. Please advise

## 2022-12-28 ENCOUNTER — Telehealth (INDEPENDENT_AMBULATORY_CARE_PROVIDER_SITE_OTHER): Payer: Self-pay

## 2022-12-28 NOTE — Telephone Encounter (Signed)
Patient called stating his right leg is swelling again. Patient feels he should start back with Unna wraps for a while again. Patient has been off wraps for 3 weeks..   Please call and make him an appt for nurse visits for 6 weeks in unna wraps, following up with Vivia Birmingham on the 6th visit.

## 2022-12-31 NOTE — Telephone Encounter (Signed)
LVM for pt TCB and schedule:  5 X unna and see FB on 6th week.

## 2023-01-01 ENCOUNTER — Encounter (INDEPENDENT_AMBULATORY_CARE_PROVIDER_SITE_OTHER): Payer: Self-pay | Admitting: Nurse Practitioner

## 2023-01-01 ENCOUNTER — Ambulatory Visit (INDEPENDENT_AMBULATORY_CARE_PROVIDER_SITE_OTHER): Payer: MEDICAID

## 2023-01-01 VITALS — BP 129/87 | HR 69 | Resp 20

## 2023-01-01 DIAGNOSIS — L97211 Non-pressure chronic ulcer of right calf limited to breakdown of skin: Secondary | ICD-10-CM | POA: Diagnosis not present

## 2023-01-01 NOTE — Telephone Encounter (Signed)
Left message per DPR with Procedure information (times) and Appt reminder.

## 2023-01-01 NOTE — Telephone Encounter (Signed)
Pt wife called in for the time of the procedure

## 2023-01-01 NOTE — Progress Notes (Signed)
Remote pacemaker transmission.   

## 2023-01-01 NOTE — Progress Notes (Signed)
History of Present Illness  There is no documented history at this time  Assessments & Plan   There are no diagnoses linked to this encounter.    Additional instructions  Subjective:  Patient presents with venous ulcer of the Bilateral lower extremity.    Procedure:  3 layer unna wrap was placed Bilateral lower extremity.   Plan:   Follow up in one week.  Xeroform lower lateral left leg.

## 2023-01-08 ENCOUNTER — Encounter (INDEPENDENT_AMBULATORY_CARE_PROVIDER_SITE_OTHER): Payer: Self-pay

## 2023-01-08 ENCOUNTER — Ambulatory Visit (INDEPENDENT_AMBULATORY_CARE_PROVIDER_SITE_OTHER): Payer: MEDICAID | Admitting: Nurse Practitioner

## 2023-01-08 VITALS — BP 123/76 | HR 60 | Resp 16 | Wt 315.0 lb

## 2023-01-08 DIAGNOSIS — L97211 Non-pressure chronic ulcer of right calf limited to breakdown of skin: Secondary | ICD-10-CM | POA: Diagnosis not present

## 2023-01-08 NOTE — Progress Notes (Signed)
History of Present Illness  There is no documented history at this time  Assessments & Plan   There are no diagnoses linked to this encounter.    Additional instructions  Subjective:  Patient presents with venous ulcer of the Bilateral lower extremity.    Procedure:  3 layer unna wrap was placed Bilateral lower extremity.   Plan:   Follow up in one week.  

## 2023-01-11 ENCOUNTER — Ambulatory Visit: Payer: MEDICAID | Attending: Internal Medicine | Admitting: Internal Medicine

## 2023-01-11 ENCOUNTER — Encounter: Payer: Self-pay | Admitting: Internal Medicine

## 2023-01-11 VITALS — BP 146/80 | HR 60 | Ht 70.0 in | Wt 315.0 lb

## 2023-01-11 DIAGNOSIS — Z01812 Encounter for preprocedural laboratory examination: Secondary | ICD-10-CM

## 2023-01-11 NOTE — Progress Notes (Signed)
HPI Seth Tucker returns today for EP followup. He is a pleasant 57 yo man with a h/o syncope and long pauses on cardiac monitori due to sinus node dysfunction s/p PPM insertion. The patient had a PE in the past. He has developed chronic peripheral edema. He admits to sodium indiscretion.  Allergies  Allergen Reactions   Bee Venom Anaphylaxis and Swelling   Hornet Venom Shortness Of Breath and Swelling   Influenza Vaccines Other (See Comments)    Seizures    Peanuts [Peanut Oil] Anaphylaxis   Phenytoin Hives and Rash   Valproic Acid Hives and Rash   Lamotrigine Rash     Current Outpatient Medications  Medication Sig Dispense Refill   albuterol (PROVENTIL HFA;VENTOLIN HFA) 108 (90 Base) MCG/ACT inhaler Inhale 2 puffs into the lungs every 4 (four) hours as needed for wheezing or shortness of breath.     amoxicillin-clavulanate (AUGMENTIN) 875-125 MG tablet Take 1 tablet by mouth 2 (two) times daily.     apixaban (ELIQUIS) 5 MG TABS tablet Take 1 tablet (5 mg total) by mouth 2 (two) times daily. 90 tablet 7   APIXABAN (ELIQUIS) VTE STARTER PACK (10MG  AND 5MG ) Take as directed on package: start with two-5mg  tablets twice daily for 7 days. On day 8, switch to one-5mg  tablet twice daily. 1 each 0   aspirin EC 81 MG EC tablet Take 1 tablet (81 mg total) by mouth daily.     atorvastatin (LIPITOR) 40 MG tablet Take 40 mg by mouth daily.     carvedilol (COREG) 6.25 MG tablet Take 1 tablet (6.25 mg total) by mouth 2 (two) times daily with a meal. 120 tablet 0   cholecalciferol (VITAMIN D) 1000 units tablet Take 1,000 Units by mouth daily.     EPINEPHrine 0.3 mg/0.3 mL IJ SOAJ injection Inject 0.3 mg into the muscle as needed for anaphylaxis. Follow package instructions as needed for severe allergy or anaphylactic reaction. 2 each 3   Fluticasone-Salmeterol (ADVAIR) 250-50 MCG/DOSE AEPB Inhale 1 puff into the lungs 2 (two) times daily.     furosemide (LASIX) 40 MG tablet Take 1.5 tablets (60  mg total) by mouth daily. 90 tablet 7   ketoconazole (NIZORAL) 2 % cream Apply to both feet and between toes once daily for 6 weeks. 60 g 1   losartan (COZAAR) 50 MG tablet Take 1 tablet (50 mg total) by mouth daily. 30 tablet 0   nitroGLYCERIN (NITROSTAT) 0.4 MG SL tablet Place 1 tablet (0.4 mg total) under the tongue every 5 (five) minutes as needed for chest pain. 25 tablet 3   oxyCODONE-acetaminophen (PERCOCET/ROXICET) 5-325 MG tablet Take 1 tablet by mouth every 6 (six) hours as needed for moderate pain. 12 tablet 0   PHENObarbital (LUMINAL) 100 MG tablet Take 200 mg by mouth at bedtime.      No current facility-administered medications for this visit.     Past Medical History:  Diagnosis Date   CAD (coronary artery disease)    a. 04/2015 Cath: LM nl, LAD 10ost/p, RI nl, LCX 10p, OM1/2/3 nl, RCA nl. Nl LV fxn.   Chronic heart failure with preserved ejection fraction (HFpEF) (HCC)    a 03/2021 Echo: EF 60-65%, no rwma, mild LVH, nl RV fxn, mild MR, Ao sclerosis.   Depression    Hyperlipidemia    LVH (left ventricular hypertrophy)    Seizure (HCC)    Sleep apnea    SSS (sick sinus syndrome) (HCC)  a. 04/2015 s/p BSX DC PPM (ser #[id number]).   Vitamin D deficiency     ROS:   All systems reviewed and negative except as noted in the HPI.   Past Surgical History:  Procedure Laterality Date   CARDIAC CATHETERIZATION N/A 05/04/2015   Procedure: Left Heart Cath and Coronary Angiography;  Surgeon: Iran Ouch, MD;  Location: [facility] INVASIVE CV LAB;  Service: Cardiovascular;  Laterality: N/A;   EP IMPLANTABLE DEVICE N/A 05/04/2015   Procedure: Pacemaker Implant;  Surgeon: Marinus Maw, MD;  Location: [facility] INVASIVE CV LAB;  Service: Cardiovascular;  Laterality: N/A;     Family History  Problem Relation Age of Onset   Hypertension Mother    Emphysema Mother    Diabetes Other        sibling   Heart disease Other        sibling   Hypertension Brother      Social History    Socioeconomic History   Marital status: Single    Spouse name: Not on file   Number of children: Not on file   Years of education: Not on file   Highest education level: Not on file  Occupational History   Not on file  Tobacco Use   Smoking status: Every Day    Current packs/day: 0.50    Average packs/day: 0.5 packs/day for 20.0 years (10.0 ttl pk-yrs)    Types: Cigarettes   Smokeless tobacco: Never   Tobacco comments:    6 a day.  Vaping Use   Vaping status: Never Used  Substance and Sexual Activity   Alcohol use: No   Drug use: No   Sexual activity: Not Currently  Other Topics Concern   Not on file  Social History Narrative   Not on file   Social Determinants of Health   Financial Resource Strain: Not on file  Food Insecurity: No Food Insecurity (03/28/2022)   Hunger Vital Sign    Worried About Running Out of Food in the Last Year: Never true    Ran Out of Food in the Last Year: Never true  Transportation Needs: No Transportation Needs (03/28/2022)   PRAPARE - Administrator, Civil Service (Medical): No    Lack of Transportation (Non-Medical): No  Physical Activity: Not on file  Stress: Not on file  Social Connections: Not on file  Intimate Partner Violence: Not At Risk (03/28/2022)   Humiliation, Afraid, Rape, and Kick questionnaire    Fear of Current or Ex-Partner: No    Emotionally Abused: No    Physically Abused: No    Sexually Abused: No     BP (!) 146/80   Pulse 60   Ht 5\' 10"  (1.778 m)   Wt (!) 315 lb (142.9 kg)   SpO2 96%   BMI 45.20 kg/m   Physical Exam:  Well appearing 57 yo man,NAD HEENT: Unremarkable Neck:  No JVD, no thyromegally Lymphatics:  No adenopathy Back:  No CVA tenderness Lungs:  Clear with rales in the bases HEART:  Regular rate rhythm, no murmurs, no rubs, no clicks Abd:  soft, positive bowel sounds, no organomegally, no rebound, no guarding Ext:  2 plus pulses, 2+ peripheral edema, no cyanosis, no clubbing Skin:   No rashes no nodules Neuro:  CN II through XII intact, motor grossly intact  DEVICE  Normal device function.  See PaceArt for details. ERI.  Assess/Plan:  Sinus node dysfunction - he is asymptomatic s/p PPM insertion. 2. PPM - his  [employer] DDD PM is working normally. He is at [facility]. He will undergo PPM gen change out in the next few weeks. 3. PE - he will continue eliquis but hold prior to PPM gen change out. 4. Diastolic heart failure - he has class 2 symptoms and is encouraged to avoid salty foods.   Leonia Reeves.D.

## 2023-01-11 NOTE — Patient Instructions (Signed)
Medication Instructions:  Your physician recommends that you continue on your current medications as directed. Please refer to the Current Medication list given to you today.  *If you need a refill on your cardiac medications before your next appointment, please call your pharmacy*  Lab Work: Bmet and CBC-- Today.  If you have labs (blood work) drawn today and your tests are completely normal, you will receive your results only by: MyChart Message (if you have MyChart) OR A paper copy in the mail If you have any lab test that is abnormal or we need to change your treatment, we will call you to review the results.  Testing/Procedures: None ordered.  Follow-Up: At [facility], you and your health needs are our priority.  As part of our continuing mission to provide you with exceptional heart care, we have created designated Provider Care Teams.  These Care Teams include your primary Cardiologist (physician) and Advanced Practice Providers (APPs -  Physician Assistants and Nurse Practitioners) who all work together to provide you with the care you need, when you need it.   Your next appointment:   Your generator change is scheduled for January 23 2023.   The format for your next appointment:   In Person  Provider:   Lewayne Bunting, MD{or one of the following Advanced Practice Providers on your designated Care Team:   Francis Dowse, [place] Casimiro Needle "Mardelle Matte" [place], [place] Earnest Rosier, NP  Remote monitoring is used to monitor your Pacemaker/ ICD from home. This monitoring reduces the number of office visits required to check your device to one time per year. It allows [place] to keep an eye on the functioning of your device to ensure it is working properly.   Important Information About Sugar

## 2023-01-11 NOTE — H&P (View-Only) (Signed)
 HPI Seth Tucker returns today for EP followup. He is a pleasant 57 yo man with a h/o syncope and long pauses on cardiac monitori due to sinus node dysfunction s/p PPM insertion. The patient had a PE in the past. He has developed chronic peripheral edema. He admits to sodium indiscretion.  Allergies  Allergen Reactions   Bee Venom Anaphylaxis and Swelling   Hornet Venom Shortness Of Breath and Swelling   Influenza Vaccines Other (See Comments)    Seizures    Peanuts [Peanut Oil] Anaphylaxis   Phenytoin Hives and Rash   Valproic Acid Hives and Rash   Lamotrigine Rash     Current Outpatient Medications  Medication Sig Dispense Refill   albuterol (PROVENTIL HFA;VENTOLIN HFA) 108 (90 Base) MCG/ACT inhaler Inhale 2 puffs into the lungs every 4 (four) hours as needed for wheezing or shortness of breath.     amoxicillin-clavulanate (AUGMENTIN) 875-125 MG tablet Take 1 tablet by mouth 2 (two) times daily.     apixaban (ELIQUIS) 5 MG TABS tablet Take 1 tablet (5 mg total) by mouth 2 (two) times daily. 90 tablet 7   APIXABAN (ELIQUIS) VTE STARTER PACK (10MG  AND 5MG ) Take as directed on package: start with two-5mg  tablets twice daily for 7 days. On day 8, switch to one-5mg  tablet twice daily. 1 each 0   aspirin EC 81 MG EC tablet Take 1 tablet (81 mg total) by mouth daily.     atorvastatin (LIPITOR) 40 MG tablet Take 40 mg by mouth daily.     carvedilol (COREG) 6.25 MG tablet Take 1 tablet (6.25 mg total) by mouth 2 (two) times daily with a meal. 120 tablet 0   cholecalciferol (VITAMIN D) 1000 units tablet Take 1,000 Units by mouth daily.     EPINEPHrine 0.3 mg/0.3 mL IJ SOAJ injection Inject 0.3 mg into the muscle as needed for anaphylaxis. Follow package instructions as needed for severe allergy or anaphylactic reaction. 2 each 3   Fluticasone-Salmeterol (ADVAIR) 250-50 MCG/DOSE AEPB Inhale 1 puff into the lungs 2 (two) times daily.     furosemide (LASIX) 40 MG tablet Take 1.5 tablets (60  mg total) by mouth daily. 90 tablet 7   ketoconazole (NIZORAL) 2 % cream Apply to both feet and between toes once daily for 6 weeks. 60 g 1   losartan (COZAAR) 50 MG tablet Take 1 tablet (50 mg total) by mouth daily. 30 tablet 0   nitroGLYCERIN (NITROSTAT) 0.4 MG SL tablet Place 1 tablet (0.4 mg total) under the tongue every 5 (five) minutes as needed for chest pain. 25 tablet 3   oxyCODONE-acetaminophen (PERCOCET/ROXICET) 5-325 MG tablet Take 1 tablet by mouth every 6 (six) hours as needed for moderate pain. 12 tablet 0   PHENObarbital (LUMINAL) 100 MG tablet Take 200 mg by mouth at bedtime.      No current facility-administered medications for this visit.     Past Medical History:  Diagnosis Date   CAD (coronary artery disease)    a. 04/2015 Cath: LM nl, LAD 10ost/p, RI nl, LCX 10p, OM1/2/3 nl, RCA nl. Nl LV fxn.   Chronic heart failure with preserved ejection fraction (HFpEF) (HCC)    a 03/2021 Echo: EF 60-65%, no rwma, mild LVH, nl RV fxn, mild MR, Ao sclerosis.   Depression    Hyperlipidemia    LVH (left ventricular hypertrophy)    Seizure (HCC)    Sleep apnea    SSS (sick sinus syndrome) (HCC)  a. 04/2015 s/p BSX DC PPM (ser #[id number]).   Vitamin D deficiency     ROS:   All systems reviewed and negative except as noted in the HPI.   Past Surgical History:  Procedure Laterality Date   CARDIAC CATHETERIZATION N/A 05/04/2015   Procedure: Left Heart Cath and Coronary Angiography;  Surgeon: Iran Ouch, MD;  Location: [facility] INVASIVE CV LAB;  Service: Cardiovascular;  Laterality: N/A;   EP IMPLANTABLE DEVICE N/A 05/04/2015   Procedure: Pacemaker Implant;  Surgeon: Marinus Maw, MD;  Location: [facility] INVASIVE CV LAB;  Service: Cardiovascular;  Laterality: N/A;     Family History  Problem Relation Age of Onset   Hypertension Mother    Emphysema Mother    Diabetes Other        sibling   Heart disease Other        sibling   Hypertension Brother      Social History    Socioeconomic History   Marital status: Single    Spouse name: Not on file   Number of children: Not on file   Years of education: Not on file   Highest education level: Not on file  Occupational History   Not on file  Tobacco Use   Smoking status: Every Day    Current packs/day: 0.50    Average packs/day: 0.5 packs/day for 20.0 years (10.0 ttl pk-yrs)    Types: Cigarettes   Smokeless tobacco: Never   Tobacco comments:    6 a day.  Vaping Use   Vaping status: Never Used  Substance and Sexual Activity   Alcohol use: No   Drug use: No   Sexual activity: Not Currently  Other Topics Concern   Not on file  Social History Narrative   Not on file   Social Determinants of Health   Financial Resource Strain: Not on file  Food Insecurity: No Food Insecurity (03/28/2022)   Hunger Vital Sign    Worried About Running Out of Food in the Last Year: Never true    Ran Out of Food in the Last Year: Never true  Transportation Needs: No Transportation Needs (03/28/2022)   PRAPARE - Administrator, Civil Service (Medical): No    Lack of Transportation (Non-Medical): No  Physical Activity: Not on file  Stress: Not on file  Social Connections: Not on file  Intimate Partner Violence: Not At Risk (03/28/2022)   Humiliation, Afraid, Rape, and Kick questionnaire    Fear of Current or Ex-Partner: No    Emotionally Abused: No    Physically Abused: No    Sexually Abused: No     BP (!) 146/80   Pulse 60   Ht 5\' 10"  (1.778 m)   Wt (!) 315 lb (142.9 kg)   SpO2 96%   BMI 45.20 kg/m   Physical Exam:  Well appearing 57 yo man,NAD HEENT: Unremarkable Neck:  No JVD, no thyromegally Lymphatics:  No adenopathy Back:  No CVA tenderness Lungs:  Clear with rales in the bases HEART:  Regular rate rhythm, no murmurs, no rubs, no clicks Abd:  soft, positive bowel sounds, no organomegally, no rebound, no guarding Ext:  2 plus pulses, 2+ peripheral edema, no cyanosis, no clubbing Skin:   No rashes no nodules Neuro:  CN II through XII intact, motor grossly intact  DEVICE  Normal device function.  See PaceArt for details. ERI.  Assess/Plan:  Sinus node dysfunction - he is asymptomatic s/p PPM insertion. 2. PPM - his  [employer] DDD PM is working normally. He is at [facility]. He will undergo PPM gen change out in the next few weeks. 3. PE - he will continue eliquis but hold prior to PPM gen change out. 4. Diastolic heart failure - he has class 2 symptoms and is encouraged to avoid salty foods.   Leonia Reeves.D.

## 2023-01-12 LAB — BASIC METABOLIC PANEL
BUN/Creatinine Ratio: 11 (ref 9–20)
BUN: 9 mg/dL (ref 6–24)
CO2: 25 mmol/L (ref 20–29)
Calcium: 9.7 mg/dL (ref 8.7–10.2)
Chloride: 105 mmol/L (ref 96–106)
Creatinine, Ser: 0.79 mg/dL (ref 0.76–1.27)
Glucose: 99 mg/dL (ref 70–99)
Potassium: 4.9 mmol/L (ref 3.5–5.2)
Sodium: 144 mmol/L (ref 134–144)
eGFR: 102 mL/min/{1.73_m2} (ref >59)

## 2023-01-12 LAB — CBC
Hematocrit: 42.4 % (ref 37.5–51.0)
Hemoglobin: 14.2 g/dL (ref 13.0–17.7)
MCH: 29.5 pg (ref 26.6–33.0)
MCHC: 33.5 g/dL (ref 31.5–35.7)
MCV: 88 fL (ref 79–97)
Platelets: 236 10*3/uL (ref 150–450)
RBC: 4.82 x10E6/uL (ref 4.14–5.80)
RDW: 12.4 % (ref 11.6–15.4)
WBC: 10.9 10*3/uL — ABNORMAL HIGH (ref 3.4–10.8)

## 2023-01-14 ENCOUNTER — Ambulatory Visit (INDEPENDENT_AMBULATORY_CARE_PROVIDER_SITE_OTHER): Payer: MEDICAID

## 2023-01-14 ENCOUNTER — Ambulatory Visit (INDEPENDENT_AMBULATORY_CARE_PROVIDER_SITE_OTHER): Payer: MEDICAID | Admitting: Podiatry

## 2023-01-14 DIAGNOSIS — I495 Sick sinus syndrome: Secondary | ICD-10-CM

## 2023-01-14 DIAGNOSIS — Z91199 Patient's noncompliance with other medical treatment and regimen due to unspecified reason: Secondary | ICD-10-CM

## 2023-01-14 LAB — CUP PACEART REMOTE DEVICE CHECK
Brady Statistic RA Percent Paced: 2 %
Brady Statistic RV Percent Paced: 0 %
Date Time Interrogation Session: 20241125023100
Implantable Lead Connection Status: 753985
Implantable Lead Connection Status: 753985
Implantable Lead Implant Date: 20170315
Implantable Lead Implant Date: 20170315
Implantable Lead Location: 753859
Implantable Lead Location: 753860
Implantable Lead Model: 7741
Implantable Lead Model: 7742
Implantable Lead Serial Number: 707950
Implantable Lead Serial Number: 749257
Implantable Pulse Generator Implant Date: 20170315
Lead Channel Impedance Value: 546 Ohm
Lead Channel Impedance Value: 675 Ohm
Lead Channel Setting Pacing Amplitude: 2 V
Lead Channel Setting Pacing Amplitude: 2 V
Lead Channel Setting Pacing Pulse Width: 0.4 ms
Lead Channel Setting Sensing Sensitivity: 2.5 mV
Pulse Gen Serial Number: 718057
Zone Setting Status: 755011

## 2023-01-14 NOTE — Progress Notes (Signed)
1. No-show for appointment

## 2023-01-15 ENCOUNTER — Encounter (INDEPENDENT_AMBULATORY_CARE_PROVIDER_SITE_OTHER): Payer: Self-pay | Admitting: Nurse Practitioner

## 2023-01-15 ENCOUNTER — Ambulatory Visit (INDEPENDENT_AMBULATORY_CARE_PROVIDER_SITE_OTHER): Payer: MEDICAID | Admitting: Nurse Practitioner

## 2023-01-15 VITALS — BP 156/78 | HR 66 | Resp 20 | Ht 70.0 in | Wt 318.6 lb

## 2023-01-15 DIAGNOSIS — L97211 Non-pressure chronic ulcer of right calf limited to breakdown of skin: Secondary | ICD-10-CM | POA: Diagnosis not present

## 2023-01-15 DIAGNOSIS — I890 Lymphedema, not elsewhere classified: Secondary | ICD-10-CM

## 2023-01-15 DIAGNOSIS — I10 Essential (primary) hypertension: Secondary | ICD-10-CM | POA: Diagnosis not present

## 2023-01-16 NOTE — Progress Notes (Signed)
Subjective:    Patient ID: Seth Tucker, male    DOB: [birth year], 58 y.o.   MRN: [id number] Chief Complaint  Patient presents with   Follow-up    4-6 week f/u after unna    HPI  Review of Systems  Cardiovascular:  Positive for leg swelling.  All other systems reviewed and are negative.      Objective:   Physical Exam Vitals reviewed.  HENT:     Head: Normocephalic.  Cardiovascular:     Rate and Rhythm: Normal rate.  Pulmonary:     Effort: Pulmonary effort is normal.  Musculoskeletal:     Right lower leg: 2+ Edema present.     Left lower leg: 1+ Edema present.  Skin:    General: Skin is warm and dry.  Neurological:     Mental Status: He is alert and oriented to person, place, and time.  Psychiatric:        Mood and Affect: Mood normal.        Behavior: Behavior normal.        Thought Content: Thought content normal.        Judgment: Judgment normal.     BP (!) 156/78 (BP Location: Left Arm)   Pulse 66   Resp 20   Ht 5\' 10"  (1.778 m)   Wt (!) 318 lb 9.6 oz (144.5 kg)   BMI 45.71 kg/m   Past Medical History:  Diagnosis Date   CAD (coronary artery disease)    a. 04/2015 Cath: LM nl, LAD 10ost/p, RI nl, LCX 10p, OM1/2/3 nl, RCA nl. Nl LV fxn.   Chronic heart failure with preserved ejection fraction (HFpEF) (HCC)    a 03/2021 Echo: EF 60-65%, no rwma, mild LVH, nl RV fxn, mild MR, Ao sclerosis.   Depression    Hyperlipidemia    LVH (left ventricular hypertrophy)    Seizure (HCC)    Sleep apnea    SSS (sick sinus syndrome) (HCC)    a. 04/2015 s/p BSX DC PPM (ser #[id number]).   Vitamin D deficiency     Social History   Socioeconomic History   Marital status: Single    Spouse name: Not on file   Number of children: Not on file   Years of education: Not on file   Highest education level: Not on file  Occupational History   Not on file  Tobacco Use   Smoking status: Every Day    Current packs/day: 0.50    Average packs/day: 0.5 packs/day for 20.0  years (10.0 ttl pk-yrs)    Types: Cigarettes   Smokeless tobacco: Never   Tobacco comments:    6 a day.  Vaping Use   Vaping status: Never Used  Substance and Sexual Activity   Alcohol use: No   Drug use: No   Sexual activity: Not Currently  Other Topics Concern   Not on file  Social History Narrative   Not on file   Social Determinants of Health   Financial Resource Strain: Not on file  Food Insecurity: No Food Insecurity (03/28/2022)   Hunger Vital Sign    Worried About Running Out of Food in the Last Year: Never true    Ran Out of Food in the Last Year: Never true  Transportation Needs: No Transportation Needs (03/28/2022)   PRAPARE - Administrator, Civil Service (Medical): No    Lack of Transportation (Non-Medical): No  Physical Activity: Not on file  Stress: Not on file  Social Connections: Not on file  Intimate Partner Violence: Not At Risk (03/28/2022)   Humiliation, Afraid, Rape, and Kick questionnaire    Fear of Current or Ex-Partner: No    Emotionally Abused: No    Physically Abused: No    Sexually Abused: No    Past Surgical History:  Procedure Laterality Date   CARDIAC CATHETERIZATION N/A 05/04/2015   Procedure: Left Heart Cath and Coronary Angiography;  Surgeon: Iran Ouch, MD;  Location: [facility] INVASIVE CV LAB;  Service: Cardiovascular;  Laterality: N/A;   EP IMPLANTABLE DEVICE N/A 05/04/2015   Procedure: Pacemaker Implant;  Surgeon: Marinus Maw, MD;  Location: [facility] INVASIVE CV LAB;  Service: Cardiovascular;  Laterality: N/A;    Family History  Problem Relation Age of Onset   Hypertension Mother    Emphysema Mother    Diabetes Other        sibling   Heart disease Other        sibling   Hypertension Brother     Allergies  Allergen Reactions   Bee Venom Anaphylaxis and Swelling   Hornet Venom Shortness Of Breath and Swelling   Influenza Vaccines Other (See Comments)    Seizures    Peanuts [Peanut Oil] Anaphylaxis   Phenytoin Hives  and Rash   Valproic Acid Hives and Rash   Lamotrigine Rash       Latest Ref Rng & Units 01/11/2023    4:26 PM 10/24/2022    5:08 PM 08/21/2022    3:34 PM  CBC  WBC 3.4 - 10.8 x10E3/uL 10.9  11.6  10.6   Hemoglobin 13.0 - 17.7 g/dL 95.6  21.3  08.6   Hematocrit 37.5 - 51.0 % 42.4  44.6  44.3   Platelets 150 - 450 x10E3/uL 236  241  254       CMP     Component Value Date/Time   NA 144 01/11/2023 1626   K 4.9 01/11/2023 1626   CL 105 01/11/2023 1626   CO2 25 01/11/2023 1626   GLUCOSE 99 01/11/2023 1626   GLUCOSE 108 (H) 10/24/2022 1708   BUN 9 01/11/2023 1626   CREATININE 0.79 01/11/2023 1626   CREATININE 0.68 (L) 05/16/2015 1535   CALCIUM 9.7 01/11/2023 1626   PROT 7.4 08/21/2022 1534   ALBUMIN 3.9 08/21/2022 1534   AST 18 08/21/2022 1534   ALT 21 08/21/2022 1534   ALKPHOS 75 08/21/2022 1534   BILITOT 0.6 08/21/2022 1534   EGFR 102 01/11/2023 1626   GFRNONAA >60 10/24/2022 1708     No results found.     Assessment & Plan:   1. Skin ulcer of right calf, limited to breakdown of skin (HCC) The ulceration is healed largely but he still continues to have some swelling.  Will have patient remain in Unna boots until he can obtain a lymphedema pump she has to keep the swelling under better control.  2. Lymphedema Recommend:  No surgery or intervention at this point in time.   The Patient is CEAP [id number].  The patient has been wearing compression for more than 12 weeks with no or little benefit.  The patient has been exercising daily for more than 12 weeks. The patient has been elevating and taking OTC pain medications for more than 12 weeks.  None of these have have eliminated the pain related to the lymphedema or the discomfort regarding excessive swelling and venous congestion.    I have reviewed my discussion with the patient regarding lymphedema and why  it  causes symptoms.  Patient will continue wearing graduated compression on a daily basis. The patient should put  the compression on first thing in the morning and removing them in the evening. The patient should not sleep in the compression.   In addition, behavioral modification throughout the day will be continued.  This will include frequent elevation (such as in a recliner), use of over the counter pain medications as needed and exercise such as walking.  The systemic causes for chronic edema such as liver, kidney and cardiac etiologies do not appear to have significant changed over the past year.    The patient has chronic , severe lymphedema with hyperpigmentation of the skin and has done MLD, skin care, medication, diet, exercise, elevation and compression for 4 weeks with no improvement,  I am recommending a lymphedema pump.  The patient still has stage 3 lymphedema and therefore, I believe that a lymph pump is needed to improve the control of the patient's lymphedema and improve the quality of life.  Additionally, a lymph pump is warranted because it will reduce the risk of cellulitis and ulceration in the future.  Patient should follow-up in six months   3. Essential hypertension Continue antihypertensive medications as already ordered, these medications have been reviewed and there are no changes at this time.   Current Outpatient Medications on File Prior to Visit  Medication Sig Dispense Refill   albuterol (PROVENTIL HFA;VENTOLIN HFA) 108 (90 Base) MCG/ACT inhaler Inhale 2 puffs into the lungs every 4 (four) hours as needed for wheezing or shortness of breath.     amoxicillin-clavulanate (AUGMENTIN) 875-125 MG tablet Take 1 tablet by mouth 2 (two) times daily.     apixaban (ELIQUIS) 5 MG TABS tablet Take 1 tablet (5 mg total) by mouth 2 (two) times daily. 90 tablet 7   APIXABAN (ELIQUIS) VTE STARTER PACK (10MG  AND 5MG ) Take as directed on package: start with two-5mg  tablets twice daily for 7 days. On day 8, switch to one-5mg  tablet twice daily. 1 each 0   aspirin EC 81 MG EC tablet Take 1  tablet (81 mg total) by mouth daily.     atorvastatin (LIPITOR) 40 MG tablet Take 40 mg by mouth daily.     carvedilol (COREG) 6.25 MG tablet Take 1 tablet (6.25 mg total) by mouth 2 (two) times daily with a meal. 120 tablet 0   cholecalciferol (VITAMIN D) 1000 units tablet Take 1,000 Units by mouth daily.     EPINEPHrine 0.3 mg/0.3 mL IJ SOAJ injection Inject 0.3 mg into the muscle as needed for anaphylaxis. Follow package instructions as needed for severe allergy or anaphylactic reaction. 2 each 3   Fluticasone-Salmeterol (ADVAIR) 250-50 MCG/DOSE AEPB Inhale 1 puff into the lungs 2 (two) times daily.     furosemide (LASIX) 40 MG tablet Take 1.5 tablets (60 mg total) by mouth daily. 90 tablet 7   ketoconazole (NIZORAL) 2 % cream Apply to both feet and between toes once daily for 6 weeks. 60 g 1   losartan (COZAAR) 50 MG tablet Take 1 tablet (50 mg total) by mouth daily. 30 tablet 0   nitroGLYCERIN (NITROSTAT) 0.4 MG SL tablet Place 1 tablet (0.4 mg total) under the tongue every 5 (five) minutes as needed for chest pain. 25 tablet 3   oxyCODONE-acetaminophen (PERCOCET/ROXICET) 5-325 MG tablet Take 1 tablet by mouth every 6 (six) hours as needed for moderate pain. 12 tablet 0   PHENObarbital (LUMINAL) 100 MG tablet Take 200  mg by mouth at bedtime.      No current facility-administered medications on file prior to visit.    There are no Patient Instructions on file for this visit. No follow-ups on file.   Georgiana Spinner, NP

## 2023-01-22 ENCOUNTER — Encounter (INDEPENDENT_AMBULATORY_CARE_PROVIDER_SITE_OTHER): Payer: Self-pay

## 2023-01-22 ENCOUNTER — Ambulatory Visit (INDEPENDENT_AMBULATORY_CARE_PROVIDER_SITE_OTHER): Payer: MEDICAID | Admitting: Nurse Practitioner

## 2023-01-22 VITALS — BP 152/88 | HR 64 | Resp 20 | Ht 70.0 in | Wt 318.0 lb

## 2023-01-22 DIAGNOSIS — L97211 Non-pressure chronic ulcer of right calf limited to breakdown of skin: Secondary | ICD-10-CM | POA: Diagnosis not present

## 2023-01-22 NOTE — Pre-Procedure Instructions (Signed)
Instructed patient on the following items: Arrival time 0800 Nothing to eat or drink after midnight No meds AM of procedure Responsible person to drive you home and stay with you for 24 hrs Wash with special soap night before and morning of procedure If on anti-coagulant drug instructions Eliquis- last dose Sunday 12/1

## 2023-01-22 NOTE — Progress Notes (Unsigned)
History of Present Illness  There is no documented history at this time  Assessments & Plan   There are no diagnoses linked to this encounter.    Additional instructions  Subjective:  Patient presents with venous ulcer of the Bilateral lower extremity.    Procedure:  3 layer unna wrap was placed Bilateral lower extremity.   Plan:   Follow up in one week.  

## 2023-01-23 ENCOUNTER — Ambulatory Visit (HOSPITAL_COMMUNITY)
Admission: RE | Disposition: A | Discharge status: Home / Self Care | Payer: MEDICAID | Source: Ambulatory Visit | Attending: Internal Medicine

## 2023-01-23 ENCOUNTER — Ambulatory Visit (HOSPITAL_COMMUNITY)
Admission: RE | Admit: 2023-01-23 | Discharge: 2023-01-23 | Disposition: A | Discharge status: Home / Self Care | Payer: MEDICAID | Source: Ambulatory Visit | Attending: Internal Medicine | Admitting: Internal Medicine

## 2023-01-23 ENCOUNTER — Other Ambulatory Visit: Payer: Self-pay

## 2023-01-23 DIAGNOSIS — I495 Sick sinus syndrome: Secondary | ICD-10-CM | POA: Diagnosis not present

## 2023-01-23 DIAGNOSIS — I5032 Chronic diastolic (congestive) heart failure: Secondary | ICD-10-CM | POA: Diagnosis not present

## 2023-01-23 DIAGNOSIS — Z86711 Personal history of pulmonary embolism: Secondary | ICD-10-CM | POA: Diagnosis not present

## 2023-01-23 DIAGNOSIS — Z7901 Long term (current) use of anticoagulants: Secondary | ICD-10-CM | POA: Diagnosis not present

## 2023-01-23 DIAGNOSIS — Z45010 Encounter for checking and testing of cardiac pacemaker pulse generator [battery]: Secondary | ICD-10-CM | POA: Diagnosis present

## 2023-01-23 HISTORY — PX: PPM GENERATOR CHANGEOUT: EP1233

## 2023-01-23 SURGERY — PPM GENERATOR CHANGEOUT

## 2023-01-23 MED ORDER — SODIUM CHLORIDE 0.9 % IV SOLN
INTRAVENOUS | Status: AC
  Filled 2023-01-23: qty 2

## 2023-01-23 MED ORDER — FENTANYL CITRATE (PF) 100 MCG/2ML IJ SOLN
INTRAMUSCULAR | Status: DC | PRN
  Administered 2023-01-23: 12.5 ug via INTRAVENOUS

## 2023-01-23 MED ORDER — SODIUM CHLORIDE 0.9 % IV SOLN
80.0000 mg | INTRAVENOUS | Status: AC
  Administered 2023-01-23: 80 mg

## 2023-01-23 MED ORDER — LIDOCAINE HCL (PF) 1 % IJ SOLN
INTRAMUSCULAR | Status: AC
  Filled 2023-01-23: qty 60

## 2023-01-23 MED ORDER — FENTANYL CITRATE (PF) 100 MCG/2ML IJ SOLN
INTRAMUSCULAR | Status: AC
  Filled 2023-01-23: qty 2

## 2023-01-23 MED ORDER — CEFAZOLIN IN SODIUM CHLORIDE 3-0.9 GM/100ML-% IV SOLN
3.0000 g | INTRAVENOUS | Status: AC
  Administered 2023-01-23: 3 g via INTRAVENOUS
  Filled 2023-01-23 (×2): qty 100

## 2023-01-23 MED ORDER — CEFAZOLIN SODIUM-DEXTROSE 2-4 GM/100ML-% IV SOLN
INTRAVENOUS | Status: AC
  Filled 2023-01-23: qty 100

## 2023-01-23 MED ORDER — MIDAZOLAM HCL 2 MG/2ML IJ SOLN
INTRAMUSCULAR | Status: AC
  Filled 2023-01-23: qty 2

## 2023-01-23 MED ORDER — SODIUM CHLORIDE 0.9 % IV SOLN: INTRAVENOUS | Status: DC

## 2023-01-23 MED ORDER — LIDOCAINE HCL (PF) 1 % IJ SOLN
INTRAMUSCULAR | Status: DC | PRN
  Administered 2023-01-23: 60 mL

## 2023-01-23 MED ORDER — MIDAZOLAM HCL 5 MG/5ML IJ SOLN
INTRAMUSCULAR | Status: DC | PRN
  Administered 2023-01-23: 1 mg via INTRAVENOUS

## 2023-01-23 MED ORDER — POVIDONE-IODINE 10 % EX SWAB
2.0000 | Freq: Once | CUTANEOUS | Status: AC
  Administered 2023-01-23: 2 via TOPICAL

## 2023-01-23 MED ORDER — ACETAMINOPHEN 325 MG PO TABS: 325.0000 mg | ORAL_TABLET | ORAL | Status: DC | PRN

## 2023-01-23 MED ORDER — CHLORHEXIDINE GLUCONATE 4 % EX SOLN: 4.0000 | Freq: Once | CUTANEOUS | Status: DC

## 2023-01-23 MED ORDER — ONDANSETRON HCL 4 MG/2ML IJ SOLN: 4.0000 mg | Freq: Four times a day (QID) | INTRAMUSCULAR | Status: DC | PRN

## 2023-01-23 SURGICAL SUPPLY — 6 items
CABLE SURGICAL S-101-97-12 (CABLE) ×1 IMPLANT
PACEMAKER ACCOLADE DR-EL (Pacemaker) IMPLANT
PAD DEFIB RADIO PHYSIO CONN (PAD) ×1 IMPLANT
POUCH AIGIS-R ANTIBACT PPM (Mesh General) ×1 IMPLANT
POUCH AIGIS-R ANTIBACT PPM MED (Mesh General) IMPLANT
TRAY PACEMAKER INSERTION (PACKS) ×1 IMPLANT

## 2023-01-23 NOTE — Interval H&P Note (Signed)
History and Physical Interval Note:  01/23/2023 11:37 AM  Seth Tucker  has presented today for surgery, with the diagnosis of eri.  The various methods of treatment have been discussed with the patient and family. After consideration of risks, benefits and other options for treatment, the patient has consented to  Procedure(s): PPM GENERATOR CHANGEOUT (N/A) as a surgical intervention.  The patient's history has been reviewed, patient examined, no change in status, stable for surgery.  I have reviewed the patient's chart and labs.  Questions were answered to the patient's satisfaction.     Lewayne Bunting

## 2023-01-23 NOTE — Discharge Instructions (Signed)

## 2023-01-24 ENCOUNTER — Encounter (HOSPITAL_COMMUNITY): Payer: Self-pay | Admitting: Internal Medicine

## 2023-01-24 ENCOUNTER — Telehealth: Payer: Self-pay

## 2023-01-24 NOTE — Telephone Encounter (Signed)
Follow-up after same day discharge: Implant date: 01/23/2023 MD: Lewayne Bunting, MD Device: PPM  Location: [facility]    Wound check visit: YES(might have to reschedule due to transportation) 90 day MD follow-up: YES   Remote Transmission received: Not yet pt is not home is staying with a friend will check back 01/25/2023  Dressing/sling removed: n/a  Confirm OAC restart on: n/a  Please continue to monitor your cardiac device site for redness, swelling, and drainage. Call the device clinic at [phone] if you experience these symptoms, fever/chills, or have questions about your device.   Remote monitoring is used to monitor your cardiac device from home. This monitoring is scheduled every 91 days by our office. It allows [place] to keep an eye on the functioning of your device to ensure it is working properly.   *pt had questions about wound care and Sharrie Rothman has answered all his questions

## 2023-01-25 MED FILL — Midazolam HCl Inj 2 MG/2ML (Base Equivalent): INTRAMUSCULAR | Qty: 1 | Status: AC

## 2023-01-29 ENCOUNTER — Encounter (INDEPENDENT_AMBULATORY_CARE_PROVIDER_SITE_OTHER): Payer: Self-pay

## 2023-01-29 ENCOUNTER — Ambulatory Visit (INDEPENDENT_AMBULATORY_CARE_PROVIDER_SITE_OTHER): Payer: MEDICAID | Admitting: Nurse Practitioner

## 2023-01-29 VITALS — BP 165/85 | HR 67 | Resp 18 | Ht 72.0 in | Wt 315.4 lb

## 2023-01-29 DIAGNOSIS — L97211 Non-pressure chronic ulcer of right calf limited to breakdown of skin: Secondary | ICD-10-CM

## 2023-01-29 NOTE — Progress Notes (Signed)
History of Present Illness  There is no documented history at this time  Assessments & Plan   There are no diagnoses linked to this encounter.    Additional instructions  Subjective:  Patient presents with venous ulcer of the Bilateral lower extremity.    Procedure:  3 layer unna wrap was placed Bilateral lower extremity.   Plan:   Follow up in one week.  

## 2023-01-31 NOTE — Telephone Encounter (Signed)
LVM to check back with pt about monitor. Looks like a transmission was sent

## 2023-02-05 ENCOUNTER — Ambulatory Visit (INDEPENDENT_AMBULATORY_CARE_PROVIDER_SITE_OTHER): Payer: MEDICAID | Admitting: Nurse Practitioner

## 2023-02-05 ENCOUNTER — Encounter (INDEPENDENT_AMBULATORY_CARE_PROVIDER_SITE_OTHER): Payer: Self-pay

## 2023-02-05 VITALS — BP 159/94 | HR 84 | Resp 18 | Ht 72.0 in | Wt 316.0 lb

## 2023-02-05 DIAGNOSIS — L97211 Non-pressure chronic ulcer of right calf limited to breakdown of skin: Secondary | ICD-10-CM

## 2023-02-05 NOTE — Progress Notes (Signed)
History of Present Illness  There is no documented history at this time  Assessments & Plan   There are no diagnoses linked to this encounter.    Additional instructions  Subjective:  Patient presents with venous ulcer of the Bilateral lower extremity.    Procedure:  3 layer unna wrap was placed Bilateral lower extremity.   Plan:   Follow up in one week.  

## 2023-02-07 ENCOUNTER — Ambulatory Visit: Payer: MEDICAID | Attending: Cardiology

## 2023-02-07 DIAGNOSIS — I495 Sick sinus syndrome: Secondary | ICD-10-CM | POA: Diagnosis not present

## 2023-02-07 LAB — CUP PACEART INCLINIC DEVICE CHECK
Date Time Interrogation Session: 20241219170706
Implantable Lead Connection Status: 753985
Implantable Lead Connection Status: 753985
Implantable Lead Implant Date: 20170315
Implantable Lead Implant Date: 20170315
Implantable Lead Location: 753859
Implantable Lead Location: 753860
Implantable Lead Model: 7741
Implantable Lead Model: 7742
Implantable Lead Serial Number: 707950
Implantable Lead Serial Number: 749257
Implantable Pulse Generator Implant Date: 20241204
Lead Channel Impedance Value: 557 Ohm
Lead Channel Impedance Value: 681 Ohm
Lead Channel Pacing Threshold Amplitude: 0.9 V
Lead Channel Pacing Threshold Amplitude: 1.3 V
Lead Channel Pacing Threshold Pulse Width: 0.4 ms
Lead Channel Pacing Threshold Pulse Width: 0.4 ms
Lead Channel Sensing Intrinsic Amplitude: 25 mV
Lead Channel Sensing Intrinsic Amplitude: 6.3 mV
Lead Channel Setting Pacing Amplitude: 2 V
Lead Channel Setting Pacing Amplitude: 2.5 V
Lead Channel Setting Pacing Pulse Width: 0.4 ms
Lead Channel Setting Sensing Sensitivity: 2.5 mV
Pulse Gen Serial Number: 706189
Zone Setting Status: 755011

## 2023-02-07 NOTE — Patient Instructions (Signed)

## 2023-02-07 NOTE — Progress Notes (Signed)
Wound check appointment. Steri-strips removed. Wound without redness or edema. Incision edges approximated, wound well healed.   Normal device function. Thresholds, sensing, and impedances consistent with implant measurements. Device programmed with Trend capture programmed on for appropriate safety margin for chronic lead. Histogram distribution appropriate for patient and level of activity. No mode switches or high ventricular rates noted. Patient educated about wound care.  There are no restrictions with arm movement or lifting.  ROV in 3 months with implanting physician.  Patient enrolled in remote follow up.

## 2023-02-07 NOTE — Progress Notes (Signed)
k

## 2023-02-11 ENCOUNTER — Encounter (INDEPENDENT_AMBULATORY_CARE_PROVIDER_SITE_OTHER): Payer: MEDICAID

## 2023-02-14 NOTE — Progress Notes (Signed)
Remote pacemaker transmission.   

## 2023-02-19 ENCOUNTER — Ambulatory Visit (INDEPENDENT_AMBULATORY_CARE_PROVIDER_SITE_OTHER): Payer: MEDICAID | Admitting: Nurse Practitioner

## 2023-02-19 ENCOUNTER — Encounter (INDEPENDENT_AMBULATORY_CARE_PROVIDER_SITE_OTHER): Payer: Self-pay | Admitting: Nurse Practitioner

## 2023-02-19 VITALS — BP 159/87 | HR 73 | Resp 18 | Ht 70.0 in | Wt 316.0 lb

## 2023-02-19 DIAGNOSIS — L97211 Non-pressure chronic ulcer of right calf limited to breakdown of skin: Secondary | ICD-10-CM

## 2023-02-19 NOTE — Progress Notes (Signed)
 History of Present Illness  There is no documented history at this time  Assessments & Plan   There are no diagnoses linked to this encounter.    Additional instructions  Subjective:  Patient presents with venous ulcer of the Bilateral lower extremity.    Procedure:  3 layer unna wrap was placed Bilateral lower extremity.   Plan:   Follow up in one week.    Patient has swelling in his hands. He mentioned it to his PCP and she told him to mention it [facility]. I presented it to [facility] and she requested that he calls his cardiologist being he just had PPM Generator Changeout on 01/23/23.

## 2023-02-21 ENCOUNTER — Ambulatory Visit (INDEPENDENT_AMBULATORY_CARE_PROVIDER_SITE_OTHER): Payer: MEDICAID | Admitting: Nurse Practitioner

## 2023-02-21 ENCOUNTER — Encounter (INDEPENDENT_AMBULATORY_CARE_PROVIDER_SITE_OTHER): Payer: Self-pay

## 2023-02-26 ENCOUNTER — Encounter (INDEPENDENT_AMBULATORY_CARE_PROVIDER_SITE_OTHER): Payer: Self-pay

## 2023-02-26 ENCOUNTER — Ambulatory Visit (INDEPENDENT_AMBULATORY_CARE_PROVIDER_SITE_OTHER): Payer: MEDICAID | Admitting: Nurse Practitioner

## 2023-02-26 VITALS — BP 142/85 | HR 66 | Resp 18

## 2023-02-26 DIAGNOSIS — L97211 Non-pressure chronic ulcer of right calf limited to breakdown of skin: Secondary | ICD-10-CM

## 2023-02-26 NOTE — Progress Notes (Signed)
 History of Present Illness  There is no documented history at this time  Assessments & Plan   There are no diagnoses linked to this encounter.    Additional instructions  Subjective:  Patient presents with venous ulcer of the Bilateral lower extremity.    Procedure:  3 layer unna wrap was placed Bilateral lower extremity.   Plan:   Follow up in one week.

## 2023-03-05 ENCOUNTER — Ambulatory Visit (INDEPENDENT_AMBULATORY_CARE_PROVIDER_SITE_OTHER): Payer: MEDICAID | Admitting: Nurse Practitioner

## 2023-03-05 ENCOUNTER — Encounter (INDEPENDENT_AMBULATORY_CARE_PROVIDER_SITE_OTHER): Payer: Self-pay

## 2023-03-05 VITALS — BP 144/93 | HR 71 | Resp 18 | Ht 72.0 in | Wt 314.0 lb

## 2023-03-05 DIAGNOSIS — I890 Lymphedema, not elsewhere classified: Secondary | ICD-10-CM

## 2023-03-05 NOTE — Progress Notes (Signed)
 History of Present Illness  There is no documented history at this time  Assessments & Plan   There are no diagnoses linked to this encounter.    Additional instructions  Subjective:  Patient presents with venous ulcer of the Bilateral lower extremity.    Procedure:  3 layer unna wrap was placed Bilateral lower extremity.   Plan:   Follow up in one week.

## 2023-03-12 ENCOUNTER — Ambulatory Visit (INDEPENDENT_AMBULATORY_CARE_PROVIDER_SITE_OTHER): Payer: MEDICAID | Admitting: Nurse Practitioner

## 2023-03-12 ENCOUNTER — Encounter (INDEPENDENT_AMBULATORY_CARE_PROVIDER_SITE_OTHER): Payer: Self-pay | Admitting: Nurse Practitioner

## 2023-03-12 VITALS — BP 151/88 | HR 79 | Resp 20 | Ht 70.0 in | Wt 328.0 lb

## 2023-03-12 DIAGNOSIS — L97211 Non-pressure chronic ulcer of right calf limited to breakdown of skin: Secondary | ICD-10-CM | POA: Diagnosis not present

## 2023-03-12 DIAGNOSIS — I890 Lymphedema, not elsewhere classified: Secondary | ICD-10-CM

## 2023-03-12 DIAGNOSIS — I10 Essential (primary) hypertension: Secondary | ICD-10-CM | POA: Diagnosis not present

## 2023-03-13 NOTE — Progress Notes (Signed)
Subjective:    Patient ID: Seth Tucker, male    DOB: [birth year], 58 y.o.   MRN: [id number] Chief Complaint  Patient presents with   Follow-up    Unna x2 see FB on 3rd visit    The patient returns today for evaluation of his bilateral lower extremities after several weeks in Unna boots.  He has since received his lymphedema pump and his swelling has made a significant improvement.  His previous venous ulceration has healed.  Much of the swelling has decreased especially in his left lower extremity.  He was recently able to order some compression socks which should be available to him soon.    Review of Systems  Cardiovascular:  Positive for leg swelling.  All other systems reviewed and are negative.      Objective:   Physical Exam Vitals reviewed.  HENT:     Head: Normocephalic.  Cardiovascular:     Rate and Rhythm: Normal rate.  Pulmonary:     Effort: Pulmonary effort is normal.  Musculoskeletal:     Right lower leg: Edema present.     Left lower leg: Edema present.  Skin:    General: Skin is warm and dry.  Neurological:     Mental Status: He is alert and oriented to person, place, and time.  Psychiatric:        Mood and Affect: Mood normal.        Behavior: Behavior normal.        Thought Content: Thought content normal.        Judgment: Judgment normal.     BP (!) 151/88   Pulse 79   Resp 20   Ht 5\' 10"  (1.778 m)   Wt (!) 328 lb (148.8 kg)   BMI 47.06 kg/m   Past Medical History:  Diagnosis Date   CAD (coronary artery disease)    a. 04/2015 Cath: LM nl, LAD 10ost/p, RI nl, LCX 10p, OM1/2/3 nl, RCA nl. Nl LV fxn.   Chronic heart failure with preserved ejection fraction (HFpEF) (HCC)    a 03/2021 Echo: EF 60-65%, no rwma, mild LVH, nl RV fxn, mild MR, Ao sclerosis.   Depression    Hyperlipidemia    LVH (left ventricular hypertrophy)    Seizure (HCC)    Sleep apnea    SSS (sick sinus syndrome) (HCC)    a. 04/2015 s/p BSX DC PPM (ser #[id number]).    Vitamin D deficiency     Social History   Socioeconomic History   Marital status: Single    Spouse name: Not on file   Number of children: Not on file   Years of education: Not on file   Highest education level: Not on file  Occupational History   Not on file  Tobacco Use   Smoking status: Every Day    Current packs/day: 0.50    Average packs/day: 0.5 packs/day for 20.0 years (10.0 ttl pk-yrs)    Types: Cigarettes   Smokeless tobacco: Never   Tobacco comments:    6 a day.  Vaping Use   Vaping status: Never Used  Substance and Sexual Activity   Alcohol use: No   Drug use: No   Sexual activity: Not Currently  Other Topics Concern   Not on file  Social History Narrative   Not on file   Social Drivers of Health   Financial Resource Strain: Not on file  Food Insecurity: No Food Insecurity (03/28/2022)   Hunger Vital Sign  Worried About Programme researcher, broadcasting/film/video in the Last Year: Never true    Ran Out of Food in the Last Year: Never true  Transportation Needs: No Transportation Needs (03/28/2022)   PRAPARE - Administrator, Civil Service (Medical): No    Lack of Transportation (Non-Medical): No  Physical Activity: Not on file  Stress: Not on file  Social Connections: Not on file  Intimate Partner Violence: Not At Risk (03/28/2022)   Humiliation, Afraid, Rape, and Kick questionnaire    Fear of Current or Ex-Partner: No    Emotionally Abused: No    Physically Abused: No    Sexually Abused: No    Past Surgical History:  Procedure Laterality Date   CARDIAC CATHETERIZATION N/A 05/04/2015   Procedure: Left Heart Cath and Coronary Angiography;  Surgeon: Iran Ouch, MD;  Location: [facility] INVASIVE CV LAB;  Service: Cardiovascular;  Laterality: N/A;   EP IMPLANTABLE DEVICE N/A 05/04/2015   Procedure: Pacemaker Implant;  Surgeon: Marinus Maw, MD;  Location: [facility] INVASIVE CV LAB;  Service: Cardiovascular;  Laterality: N/A;   PPM GENERATOR CHANGEOUT N/A 01/23/2023    Procedure: PPM GENERATOR CHANGEOUT;  Surgeon: Marinus Maw, MD;  Location: [facility] INVASIVE CV LAB;  Service: Cardiovascular;  Laterality: N/A;    Family History  Problem Relation Age of Onset   Hypertension Mother    Emphysema Mother    Diabetes Other        sibling   Heart disease Other        sibling   Hypertension Brother     Allergies  Allergen Reactions   Bee Venom Anaphylaxis and Swelling   Hornet Venom Shortness Of Breath and Swelling   Influenza Vaccines Other (See Comments)    Seizures    Peanuts [Peanut Oil] Anaphylaxis   Phenytoin Hives and Rash   Valproic Acid Hives and Rash   Lamotrigine Rash       Latest Ref Rng & Units 01/11/2023    4:26 PM 10/24/2022    5:08 PM 08/21/2022    3:34 PM  CBC  WBC 3.4 - 10.8 x10E3/uL 10.9  11.6  10.6   Hemoglobin 13.0 - 17.7 g/dL 96.2  95.2  84.1   Hematocrit 37.5 - 51.0 % 42.4  44.6  44.3   Platelets 150 - 450 x10E3/uL 236  241  254       CMP     Component Value Date/Time   NA 144 01/11/2023 1626   K 4.9 01/11/2023 1626   CL 105 01/11/2023 1626   CO2 25 01/11/2023 1626   GLUCOSE 99 01/11/2023 1626   GLUCOSE 108 (H) 10/24/2022 1708   BUN 9 01/11/2023 1626   CREATININE 0.79 01/11/2023 1626   CREATININE 0.68 (L) 05/16/2015 1535   CALCIUM 9.7 01/11/2023 1626   PROT 7.4 08/21/2022 1534   ALBUMIN 3.9 08/21/2022 1534   AST 18 08/21/2022 1534   ALT 21 08/21/2022 1534   ALKPHOS 75 08/21/2022 1534   BILITOT 0.6 08/21/2022 1534   EGFR 102 01/11/2023 1626   GFRNONAA >60 10/24/2022 1708     No results found.     Assessment & Plan:   1. Lymphedema (Primary) The patient has received his lymphedema pump and his lower extremity edema has decreased significantly.  He still has more swelling in the right than the left but overall he is doing much better.  He has recently ordered some compression socks which should be available soon we will plan on placing  the patient back into bilateral Unna boots, that he can remove on his  own once he obtains his compression socks.  Once he obtains his compression socks he should utilize these daily.  He should also continue with use of his medical grade compression socks.  Will have her return in 6 weeks to evaluate his progress.  2. Skin ulcer of right calf, limited to breakdown of skin (HCC) Essentially has healed  3. Essential hypertension Continue antihypertensive medications as already ordered, these medications have been reviewed and there are no changes at this time.   Current Outpatient Medications on File Prior to Visit  Medication Sig Dispense Refill   albuterol (PROVENTIL HFA;VENTOLIN HFA) 108 (90 Base) MCG/ACT inhaler Inhale 2 puffs into the lungs every 4 (four) hours as needed for wheezing or shortness of breath.     amoxicillin-clavulanate (AUGMENTIN) 875-125 MG tablet Take 1 tablet by mouth 2 (two) times daily.     apixaban (ELIQUIS) 5 MG TABS tablet Take 1 tablet (5 mg total) by mouth 2 (two) times daily. 90 tablet 7   aspirin EC 81 MG EC tablet Take 1 tablet (81 mg total) by mouth daily.     atorvastatin (LIPITOR) 40 MG tablet Take 40 mg by mouth daily.     carvedilol (COREG) 6.25 MG tablet Take 1 tablet (6.25 mg total) by mouth 2 (two) times daily with a meal. 120 tablet 0   cholecalciferol (VITAMIN D) 1000 units tablet Take 1,000 Units by mouth daily.     EPINEPHrine 0.3 mg/0.3 mL IJ SOAJ injection Inject 0.3 mg into the muscle as needed for anaphylaxis. Follow package instructions as needed for severe allergy or anaphylactic reaction. 2 each 3   Fluticasone-Salmeterol (ADVAIR) 250-50 MCG/DOSE AEPB Inhale 1 puff into the lungs 2 (two) times daily.     furosemide (LASIX) 40 MG tablet Take 1.5 tablets (60 mg total) by mouth daily. 90 tablet 7   HYDROcodone-acetaminophen (NORCO/VICODIN) 5-325 MG tablet Take 1 tablet by mouth 3 (three) times daily as needed.     ketoconazole (NIZORAL) 2 % cream Apply to both feet and between toes once daily for 6 weeks. 60 g 1    losartan (COZAAR) 50 MG tablet Take 1 tablet (50 mg total) by mouth daily. 30 tablet 0   nitroGLYCERIN (NITROSTAT) 0.4 MG SL tablet Place 1 tablet (0.4 mg total) under the tongue every 5 (five) minutes as needed for chest pain. 25 tablet 3   oxyCODONE-acetaminophen (PERCOCET/ROXICET) 5-325 MG tablet Take 1 tablet by mouth every 6 (six) hours as needed for moderate pain. 12 tablet 0   PHENObarbital (LUMINAL) 100 MG tablet Take 200 mg by mouth at bedtime.      No current facility-administered medications on file prior to visit.    There are no Patient Instructions on file for this visit. No follow-ups on file.   Georgiana Spinner, NP

## 2023-03-18 ENCOUNTER — Telehealth (INDEPENDENT_AMBULATORY_CARE_PROVIDER_SITE_OTHER): Payer: Self-pay

## 2023-03-18 NOTE — Telephone Encounter (Signed)
Patient called requesting his last office visit gets faxed over to [facility] so he can receive his compression stocking.  Notes faxed 03/18/23

## 2023-04-23 ENCOUNTER — Ambulatory Visit (INDEPENDENT_AMBULATORY_CARE_PROVIDER_SITE_OTHER): Payer: MEDICAID | Admitting: Nurse Practitioner

## 2023-04-25 ENCOUNTER — Ambulatory Visit: Payer: MEDICAID

## 2023-04-25 ENCOUNTER — Ambulatory Visit: Payer: MEDICAID | Attending: Internal Medicine | Admitting: Internal Medicine

## 2023-04-25 DIAGNOSIS — I495 Sick sinus syndrome: Secondary | ICD-10-CM | POA: Diagnosis not present

## 2023-04-26 ENCOUNTER — Encounter: Payer: Self-pay | Admitting: Internal Medicine

## 2023-04-27 ENCOUNTER — Encounter: Payer: Self-pay | Admitting: Internal Medicine

## 2023-04-27 LAB — CUP PACEART REMOTE DEVICE CHECK
Battery Remaining Longevity: 180 mo
Battery Remaining Percentage: 100 %
Brady Statistic RA Percent Paced: 1 %
Brady Statistic RV Percent Paced: 0 %
Date Time Interrogation Session: 20250306023200
Implantable Lead Connection Status: 753985
Implantable Lead Connection Status: 753985
Implantable Lead Implant Date: 20170315
Implantable Lead Implant Date: 20170315
Implantable Lead Location: 753859
Implantable Lead Location: 753860
Implantable Lead Model: 7741
Implantable Lead Model: 7742
Implantable Lead Serial Number: 707950
Implantable Lead Serial Number: 749257
Implantable Pulse Generator Implant Date: 20241204
Lead Channel Impedance Value: 580 Ohm
Lead Channel Impedance Value: 690 Ohm
Lead Channel Pacing Threshold Amplitude: 0.6 V
Lead Channel Pacing Threshold Amplitude: 1.5 V
Lead Channel Pacing Threshold Pulse Width: 0.4 ms
Lead Channel Pacing Threshold Pulse Width: 0.4 ms
Lead Channel Setting Pacing Amplitude: 2 V
Lead Channel Setting Pacing Amplitude: 2.5 V
Lead Channel Setting Pacing Pulse Width: 0.4 ms
Lead Channel Setting Sensing Sensitivity: 2.5 mV
Pulse Gen Serial Number: 706189
Zone Setting Status: 755011

## 2023-05-07 ENCOUNTER — Encounter (INDEPENDENT_AMBULATORY_CARE_PROVIDER_SITE_OTHER): Payer: Self-pay

## 2023-05-07 ENCOUNTER — Ambulatory Visit (INDEPENDENT_AMBULATORY_CARE_PROVIDER_SITE_OTHER): Payer: MEDICAID | Admitting: Nurse Practitioner

## 2023-05-08 ENCOUNTER — Emergency Department: Payer: MEDICAID

## 2023-05-08 ENCOUNTER — Inpatient Hospital Stay: Payer: MEDICAID

## 2023-05-08 ENCOUNTER — Inpatient Hospital Stay
Admission: EM | Admit: 2023-05-08 | Discharge: 2023-05-20 | DRG: 871 | Discharge status: Against Medical Advice | Payer: MEDICAID | Attending: Internal Medicine | Admitting: Internal Medicine

## 2023-05-08 ENCOUNTER — Other Ambulatory Visit: Payer: Self-pay

## 2023-05-08 DIAGNOSIS — Z888 Allergy status to other drugs, medicaments and biological substances status: Secondary | ICD-10-CM

## 2023-05-08 DIAGNOSIS — I2489 Other forms of acute ischemic heart disease: Secondary | ICD-10-CM | POA: Diagnosis present

## 2023-05-08 DIAGNOSIS — Z1152 Encounter for screening for COVID-19: Secondary | ICD-10-CM | POA: Diagnosis not present

## 2023-05-08 DIAGNOSIS — R6521 Severe sepsis with septic shock: Secondary | ICD-10-CM | POA: Diagnosis present

## 2023-05-08 DIAGNOSIS — E8729 Other acidosis: Secondary | ICD-10-CM | POA: Diagnosis present

## 2023-05-08 DIAGNOSIS — I110 Hypertensive heart disease with heart failure: Secondary | ICD-10-CM | POA: Diagnosis present

## 2023-05-08 DIAGNOSIS — E785 Hyperlipidemia, unspecified: Secondary | ICD-10-CM | POA: Diagnosis present

## 2023-05-08 DIAGNOSIS — I495 Sick sinus syndrome: Secondary | ICD-10-CM | POA: Diagnosis present

## 2023-05-08 DIAGNOSIS — Z91030 Bee allergy status: Secondary | ICD-10-CM

## 2023-05-08 DIAGNOSIS — F17210 Nicotine dependence, cigarettes, uncomplicated: Secondary | ICD-10-CM | POA: Diagnosis present

## 2023-05-08 DIAGNOSIS — N179 Acute kidney failure, unspecified: Secondary | ICD-10-CM | POA: Diagnosis present

## 2023-05-08 DIAGNOSIS — I5032 Chronic diastolic (congestive) heart failure: Secondary | ICD-10-CM | POA: Diagnosis present

## 2023-05-08 DIAGNOSIS — Z8249 Family history of ischemic heart disease and other diseases of the circulatory system: Secondary | ICD-10-CM

## 2023-05-08 DIAGNOSIS — J189 Pneumonia, unspecified organism: Secondary | ICD-10-CM | POA: Diagnosis present

## 2023-05-08 DIAGNOSIS — Z6841 Body Mass Index (BMI) 40.0 and over, adult: Secondary | ICD-10-CM | POA: Diagnosis not present

## 2023-05-08 DIAGNOSIS — K760 Fatty (change of) liver, not elsewhere classified: Secondary | ICD-10-CM | POA: Diagnosis present

## 2023-05-08 DIAGNOSIS — E559 Vitamin D deficiency, unspecified: Secondary | ICD-10-CM | POA: Diagnosis present

## 2023-05-08 DIAGNOSIS — J441 Chronic obstructive pulmonary disease with (acute) exacerbation: Secondary | ICD-10-CM | POA: Diagnosis present

## 2023-05-08 DIAGNOSIS — J9602 Acute respiratory failure with hypercapnia: Secondary | ICD-10-CM | POA: Diagnosis present

## 2023-05-08 DIAGNOSIS — R55 Syncope and collapse: Secondary | ICD-10-CM | POA: Diagnosis not present

## 2023-05-08 DIAGNOSIS — F32A Depression, unspecified: Secondary | ICD-10-CM | POA: Diagnosis present

## 2023-05-08 DIAGNOSIS — Z86711 Personal history of pulmonary embolism: Secondary | ICD-10-CM

## 2023-05-08 DIAGNOSIS — Z91148 Patient's other noncompliance with medication regimen for other reason: Secondary | ICD-10-CM

## 2023-05-08 DIAGNOSIS — G9341 Metabolic encephalopathy: Secondary | ICD-10-CM | POA: Diagnosis present

## 2023-05-08 DIAGNOSIS — B970 Adenovirus as the cause of diseases classified elsewhere: Secondary | ICD-10-CM | POA: Diagnosis present

## 2023-05-08 DIAGNOSIS — Z950 Presence of cardiac pacemaker: Secondary | ICD-10-CM

## 2023-05-08 DIAGNOSIS — G4733 Obstructive sleep apnea (adult) (pediatric): Secondary | ICD-10-CM | POA: Diagnosis present

## 2023-05-08 DIAGNOSIS — Z825 Family history of asthma and other chronic lower respiratory diseases: Secondary | ICD-10-CM

## 2023-05-08 DIAGNOSIS — A419 Sepsis, unspecified organism: Principal | ICD-10-CM | POA: Diagnosis present

## 2023-05-08 DIAGNOSIS — Z79899 Other long term (current) drug therapy: Secondary | ICD-10-CM

## 2023-05-08 DIAGNOSIS — Z887 Allergy status to serum and vaccine status: Secondary | ICD-10-CM

## 2023-05-08 DIAGNOSIS — G40909 Epilepsy, unspecified, not intractable, without status epilepticus: Secondary | ICD-10-CM | POA: Diagnosis present

## 2023-05-08 DIAGNOSIS — G9340 Encephalopathy, unspecified: Secondary | ICD-10-CM | POA: Diagnosis not present

## 2023-05-08 DIAGNOSIS — R404 Transient alteration of awareness: Secondary | ICD-10-CM | POA: Diagnosis present

## 2023-05-08 DIAGNOSIS — J9601 Acute respiratory failure with hypoxia: Secondary | ICD-10-CM | POA: Diagnosis present

## 2023-05-08 DIAGNOSIS — Z7951 Long term (current) use of inhaled steroids: Secondary | ICD-10-CM

## 2023-05-08 DIAGNOSIS — Z7982 Long term (current) use of aspirin: Secondary | ICD-10-CM

## 2023-05-08 DIAGNOSIS — Z7901 Long term (current) use of anticoagulants: Secondary | ICD-10-CM

## 2023-05-08 DIAGNOSIS — I2510 Atherosclerotic heart disease of native coronary artery without angina pectoris: Secondary | ICD-10-CM | POA: Diagnosis present

## 2023-05-08 DIAGNOSIS — J440 Chronic obstructive pulmonary disease with acute lower respiratory infection: Secondary | ICD-10-CM | POA: Diagnosis present

## 2023-05-08 DIAGNOSIS — Z91010 Allergy to peanuts: Secondary | ICD-10-CM

## 2023-05-08 LAB — COMPREHENSIVE METABOLIC PANEL
ALT: 55 U/L — ABNORMAL HIGH (ref 0–44)
AST: 97 U/L — ABNORMAL HIGH (ref 15–41)
Albumin: 3.5 g/dL (ref 3.5–5.0)
Alkaline Phosphatase: 62 U/L (ref 38–126)
Anion gap: 9 (ref 5–15)
BUN: 56 mg/dL — ABNORMAL HIGH (ref 6–20)
CO2: 23 mmol/L (ref 22–32)
Calcium: 7.8 mg/dL — ABNORMAL LOW (ref 8.9–10.3)
Chloride: 104 mmol/L (ref 98–111)
Creatinine, Ser: 2.28 mg/dL — ABNORMAL HIGH (ref 0.61–1.24)
GFR, Estimated: 32 mL/min — ABNORMAL LOW (ref >60)
Glucose, Bld: 135 mg/dL — ABNORMAL HIGH (ref 70–99)
Potassium: 5.1 mmol/L (ref 3.5–5.1)
Sodium: 136 mmol/L (ref 135–145)
Total Bilirubin: 0.8 mg/dL (ref 0.0–1.2)
Total Protein: 8 g/dL (ref 6.5–8.1)

## 2023-05-08 LAB — CBC WITH DIFFERENTIAL/PLATELET
Abs Immature Granulocytes: 0.09 10*3/uL — ABNORMAL HIGH (ref 0.00–0.07)
Basophils Absolute: 0 10*3/uL (ref 0.0–0.1)
Basophils Relative: 0 %
Eosinophils Absolute: 0 10*3/uL (ref 0.0–0.5)
Eosinophils Relative: 0 %
HCT: 50.6 % (ref 39.0–52.0)
Hemoglobin: 16.1 g/dL (ref 13.0–17.0)
Immature Granulocytes: 1 %
Lymphocytes Relative: 13 %
Lymphs Abs: 1.9 10*3/uL (ref 0.7–4.0)
MCH: 28.7 pg (ref 26.0–34.0)
MCHC: 31.8 g/dL (ref 30.0–36.0)
MCV: 90.2 fL (ref 80.0–100.0)
Monocytes Absolute: 1 10*3/uL (ref 0.1–1.0)
Monocytes Relative: 7 %
Neutro Abs: 11.5 10*3/uL — ABNORMAL HIGH (ref 1.7–7.7)
Neutrophils Relative %: 79 %
Platelets: 128 10*3/uL — ABNORMAL LOW (ref 150–400)
RBC: 5.61 MIL/uL (ref 4.22–5.81)
RDW: 14.3 % (ref 11.5–15.5)
WBC: 14.6 10*3/uL — ABNORMAL HIGH (ref 4.0–10.5)
nRBC: 0 % (ref 0.0–0.2)

## 2023-05-08 LAB — BLOOD GAS, VENOUS
Acid-base deficit: 5.2 mmol/L — ABNORMAL HIGH (ref 0.0–2.0)
Bicarbonate: 26 mmol/L (ref 20.0–28.0)
O2 Saturation: 68.4 %
Patient temperature: 37
pCO2, Ven: 80 mmHg (ref 44–60)
pH, Ven: 7.12 — CL (ref 7.25–7.43)
pO2, Ven: 44 mmHg (ref 32–45)

## 2023-05-08 LAB — TROPONIN I (HIGH SENSITIVITY)
Troponin I (High Sensitivity): 129 ng/L (ref <18)
Troponin I (High Sensitivity): 118 ng/L (ref <18)

## 2023-05-08 LAB — URINALYSIS, W/ REFLEX TO CULTURE (INFECTION SUSPECTED)
Bilirubin Urine: NEGATIVE
Glucose, UA: NEGATIVE mg/dL
Ketones, ur: NEGATIVE mg/dL
Leukocytes,Ua: NEGATIVE
Nitrite: NEGATIVE
Protein, ur: 100 mg/dL — AB
Specific Gravity, Urine: 1.016 (ref 1.005–1.030)
pH: 5 (ref 5.0–8.0)

## 2023-05-08 LAB — BRAIN NATRIURETIC PEPTIDE: B Natriuretic Peptide: 22.9 pg/mL (ref 0.0–100.0)

## 2023-05-08 LAB — URINE DRUG SCREEN, QUALITATIVE (ARMC ONLY)
Amphetamines, Ur Screen: NOT DETECTED
Barbiturates, Ur Screen: POSITIVE — AB
Benzodiazepine, Ur Scrn: NOT DETECTED
Cannabinoid 50 Ng, Ur ~~LOC~~: NOT DETECTED
Cocaine Metabolite,Ur ~~LOC~~: NOT DETECTED
MDMA (Ecstasy)Ur Screen: NOT DETECTED
Methadone Scn, Ur: NOT DETECTED
Opiate, Ur Screen: NOT DETECTED
Phencyclidine (PCP) Ur S: NOT DETECTED
Tricyclic, Ur Screen: NOT DETECTED

## 2023-05-08 LAB — PROCALCITONIN: Procalcitonin: 4.09 ng/mL

## 2023-05-08 LAB — PROTIME-INR
INR: 1.2 (ref 0.8–1.2)
Prothrombin Time: 15.4 s — ABNORMAL HIGH (ref 11.4–15.2)

## 2023-05-08 LAB — GLUCOSE, CAPILLARY
Glucose-Capillary: 146 mg/dL — ABNORMAL HIGH (ref 70–99)
Glucose-Capillary: 141 mg/dL — ABNORMAL HIGH (ref 70–99)

## 2023-05-08 LAB — LACTIC ACID, PLASMA
Lactic Acid, Venous: 1.6 mmol/L (ref 0.5–1.9)
Lactic Acid, Venous: 1.9 mmol/L (ref 0.5–1.9)

## 2023-05-08 LAB — RESP PANEL BY RT-PCR (RSV, FLU A&B, COVID)  RVPGX2
Influenza A by PCR: NEGATIVE
Influenza B by PCR: NEGATIVE
Resp Syncytial Virus by PCR: NEGATIVE
SARS Coronavirus 2 by RT PCR: NEGATIVE

## 2023-05-08 LAB — APTT: aPTT: 37 s — ABNORMAL HIGH (ref 24–36)

## 2023-05-08 LAB — PHENOBARBITAL LEVEL: Phenobarbital: 25.7 ug/mL (ref 15.0–40.0)

## 2023-05-08 MED ORDER — FENTANYL BOLUS VIA INFUSION: 50.0000 ug | INTRAVENOUS | Status: DC | PRN

## 2023-05-08 MED ORDER — MIDAZOLAM HCL 2 MG/2ML IJ SOLN
1.0000 mg | INTRAMUSCULAR | Status: DC | PRN
  Administered 2023-05-09 – 2023-05-10 (×6): 2 mg via INTRAVENOUS
  Filled 2023-05-08 (×6): qty 2

## 2023-05-08 MED ORDER — POLYETHYLENE GLYCOL 3350 17 G PO PACK: 17.0000 g | PACK | Freq: Every day | ORAL | Status: DC | PRN

## 2023-05-08 MED ORDER — POLYETHYLENE GLYCOL 3350 17 G PO PACK
17.0000 g | PACK | Freq: Every day | ORAL | Status: DC | PRN
Start: 2023-05-08 — End: 2023-05-08

## 2023-05-08 MED ORDER — IPRATROPIUM-ALBUTEROL 0.5-2.5 (3) MG/3ML IN SOLN
3.0000 mL | Freq: Four times a day (QID) | RESPIRATORY_TRACT | Status: DC
  Administered 2023-05-09 – 2023-05-13 (×18): 3 mL via RESPIRATORY_TRACT
  Filled 2023-05-08 (×18): qty 3

## 2023-05-08 MED ORDER — LABETALOL HCL 5 MG/ML IV SOLN: 5.0000 mg | INTRAVENOUS | Status: DC | PRN

## 2023-05-08 MED ORDER — FENTANYL CITRATE PF 50 MCG/ML IJ SOSY: 50.0000 ug | PREFILLED_SYRINGE | INTRAMUSCULAR | Status: DC | PRN

## 2023-05-08 MED ORDER — DOCUSATE SODIUM 50 MG/5ML PO LIQD: 100.0000 mg | Freq: Two times a day (BID) | ORAL | Status: DC | PRN

## 2023-05-08 MED ORDER — SODIUM CHLORIDE 0.9 % IV SOLN
500.0000 mg | INTRAVENOUS | Status: AC
  Administered 2023-05-09 – 2023-05-12 (×4): 500 mg via INTRAVENOUS
  Filled 2023-05-08 (×4): qty 5

## 2023-05-08 MED ORDER — VANCOMYCIN HCL 1250 MG/250ML IV SOLN
1250.0000 mg | Freq: Once | INTRAVENOUS | Status: AC
  Administered 2023-05-09: 1250 mg via INTRAVENOUS
  Filled 2023-05-08: qty 250

## 2023-05-08 MED ORDER — ASPIRIN 300 MG RE SUPP
300.0000 mg | Freq: Once | RECTAL | Status: AC
  Administered 2023-05-08: 300 mg via RECTAL
  Filled 2023-05-08: qty 1

## 2023-05-08 MED ORDER — FENTANYL CITRATE PF 50 MCG/ML IJ SOSY
50.0000 ug | PREFILLED_SYRINGE | INTRAMUSCULAR | Status: DC | PRN
  Administered 2023-05-10: 100 ug via INTRAVENOUS
  Filled 2023-05-08: qty 2

## 2023-05-08 MED ORDER — FENTANYL 2500MCG IN NS 250ML (10MCG/ML) PREMIX INFUSION
0.0000 ug/h | INTRAVENOUS | Status: DC
  Filled 2023-05-08: qty 250

## 2023-05-08 MED ORDER — IPRATROPIUM-ALBUTEROL 0.5-2.5 (3) MG/3ML IN SOLN
9.0000 mL | Freq: Once | RESPIRATORY_TRACT | Status: AC
  Administered 2023-05-08: 9 mL via RESPIRATORY_TRACT
  Filled 2023-05-08: qty 9

## 2023-05-08 MED ORDER — BUDESONIDE 0.25 MG/2ML IN SUSP
0.2500 mg | Freq: Two times a day (BID) | RESPIRATORY_TRACT | Status: DC
  Administered 2023-05-09 – 2023-05-10 (×3): 0.25 mg via RESPIRATORY_TRACT
  Filled 2023-05-08 (×3): qty 2

## 2023-05-08 MED ORDER — ETOMIDATE 2 MG/ML IV SOLN
INTRAVENOUS | Status: AC
  Administered 2023-05-08: 40 mg via INTRAVENOUS
  Filled 2023-05-08: qty 20

## 2023-05-08 MED ORDER — LACTATED RINGERS IV BOLUS
500.0000 mL | Freq: Once | INTRAVENOUS | Status: AC
  Administered 2023-05-08: 500 mL via INTRAVENOUS

## 2023-05-08 MED ORDER — HEPARIN (PORCINE) 25000 UT/250ML-% IV SOLN
1400.0000 [IU]/h | INTRAVENOUS | Status: AC
  Administered 2023-05-08 – 2023-05-09 (×2): 1750 [IU]/h via INTRAVENOUS
  Administered 2023-05-10: 1050 [IU]/h via INTRAVENOUS
  Administered 2023-05-11: 1200 [IU]/h via INTRAVENOUS
  Administered 2023-05-12 – 2023-05-13 (×2): 1400 [IU]/h via INTRAVENOUS
  Filled 2023-05-08 (×6): qty 250

## 2023-05-08 MED ORDER — ROCURONIUM BROMIDE 10 MG/ML (PF) SYRINGE: 100.0000 mg | PREFILLED_SYRINGE | Freq: Once | INTRAVENOUS | Status: AC

## 2023-05-08 MED ORDER — ETOMIDATE 2 MG/ML IV SOLN: 40.0000 mg | Freq: Once | INTRAVENOUS | Status: AC

## 2023-05-08 MED ORDER — SODIUM CHLORIDE 0.9 % IV SOLN
250.0000 mL | INTRAVENOUS | Status: AC
  Administered 2023-05-08: 250 mL via INTRAVENOUS

## 2023-05-08 MED ORDER — DOCUSATE SODIUM 50 MG/5ML PO LIQD
100.0000 mg | Freq: Two times a day (BID) | ORAL | Status: DC
  Administered 2023-05-08 – 2023-05-10 (×4): 100 mg
  Filled 2023-05-08 (×4): qty 10

## 2023-05-08 MED ORDER — IPRATROPIUM-ALBUTEROL 0.5-2.5 (3) MG/3ML IN SOLN: 3.0000 mL | RESPIRATORY_TRACT | Status: DC | PRN

## 2023-05-08 MED ORDER — LABETALOL HCL 5 MG/ML IV SOLN
10.0000 mg | INTRAVENOUS | Status: DC | PRN
  Administered 2023-05-08: 10 mg via INTRAVENOUS
  Filled 2023-05-08: qty 4

## 2023-05-08 MED ORDER — ACETAMINOPHEN 325 MG PO TABS
650.0000 mg | ORAL_TABLET | ORAL | Status: DC | PRN
  Administered 2023-05-09: 650 mg
  Filled 2023-05-08: qty 2

## 2023-05-08 MED ORDER — PHENOBARBITAL 100 MG PO TABS
200.0000 mg | ORAL_TABLET | Freq: Every day | ORAL | Status: DC
  Administered 2023-05-08: 200 mg
  Filled 2023-05-08 (×2): qty 2

## 2023-05-08 MED ORDER — SODIUM CHLORIDE 0.9 % IV SOLN
2.0000 g | Freq: Once | INTRAVENOUS | Status: AC
  Administered 2023-05-08: 2 g via INTRAVENOUS
  Filled 2023-05-08: qty 20

## 2023-05-08 MED ORDER — METHYLPREDNISOLONE SODIUM SUCC 40 MG IJ SOLR
40.0000 mg | Freq: Two times a day (BID) | INTRAMUSCULAR | Status: DC
  Administered 2023-05-09 – 2023-05-10 (×3): 40 mg via INTRAVENOUS
  Filled 2023-05-08 (×4): qty 1

## 2023-05-08 MED ORDER — DOCUSATE SODIUM 100 MG PO CAPS: 100.0000 mg | ORAL_CAPSULE | Freq: Two times a day (BID) | ORAL | Status: DC | PRN

## 2023-05-08 MED ORDER — NOREPINEPHRINE 4 MG/250ML-% IV SOLN
2.0000 ug/min | INTRAVENOUS | Status: DC
Start: 2023-05-08 — End: 2023-05-10

## 2023-05-08 MED ORDER — SODIUM CHLORIDE 0.9 % IV SOLN
3.0000 g | Freq: Four times a day (QID) | INTRAVENOUS | Status: DC
  Administered 2023-05-09 (×3): 3 g via INTRAVENOUS
  Filled 2023-05-08 (×4): qty 8

## 2023-05-08 MED ORDER — PROPOFOL 1000 MG/100ML IV EMUL
5.0000 ug/kg/min | INTRAVENOUS | Status: DC
  Administered 2023-05-08: 10 ug/kg/min via INTRAVENOUS
  Administered 2023-05-09: 15 ug/kg/min via INTRAVENOUS
  Administered 2023-05-09 – 2023-05-10 (×2): 20 ug/kg/min via INTRAVENOUS
  Administered 2023-05-10 (×3): 30 ug/kg/min via INTRAVENOUS
  Filled 2023-05-08 (×8): qty 100

## 2023-05-08 MED ORDER — ROCURONIUM BROMIDE 10 MG/ML (PF) SYRINGE
PREFILLED_SYRINGE | INTRAVENOUS | Status: AC
Start: 2023-05-08 — End: 2023-05-08
  Administered 2023-05-08: 100 mg via INTRAVENOUS
  Filled 2023-05-08: qty 10

## 2023-05-08 MED ORDER — POLYETHYLENE GLYCOL 3350 17 G PO PACK
17.0000 g | PACK | Freq: Every day | ORAL | Status: DC
  Administered 2023-05-08 – 2023-05-10 (×3): 17 g
  Filled 2023-05-08 (×3): qty 1

## 2023-05-08 MED ORDER — FAMOTIDINE 20 MG PO TABS
20.0000 mg | ORAL_TABLET | Freq: Two times a day (BID) | ORAL | Status: DC
  Administered 2023-05-08 – 2023-05-10 (×4): 20 mg
  Filled 2023-05-08 (×4): qty 1

## 2023-05-08 MED ORDER — LACTATED RINGERS IV BOLUS (SEPSIS)
500.0000 mL | Freq: Once | INTRAVENOUS | Status: AC
  Administered 2023-05-08: 500 mL via INTRAVENOUS

## 2023-05-08 MED ORDER — CALCIUM GLUCONATE-NACL 1-0.675 GM/50ML-% IV SOLN: 1.0000 g | Freq: Once | INTRAVENOUS | Status: DC

## 2023-05-08 MED ORDER — SODIUM CHLORIDE 0.9 % IV SOLN
500.0000 mg | Freq: Once | INTRAVENOUS | Status: AC
  Administered 2023-05-08: 500 mg via INTRAVENOUS
  Filled 2023-05-08: qty 5

## 2023-05-08 MED ORDER — ACETAMINOPHEN 325 MG RE SUPP
650.0000 mg | Freq: Once | RECTAL | Status: AC
  Administered 2023-05-08: 650 mg via RECTAL
  Filled 2023-05-08: qty 2

## 2023-05-08 MED ORDER — SODIUM CHLORIDE 0.9 % IV SOLN
100.0000 mg | Freq: Once | INTRAVENOUS | Status: AC
  Administered 2023-05-08: 100 mg via INTRAVENOUS
  Filled 2023-05-08: qty 100

## 2023-05-08 MED ORDER — ACETAMINOPHEN 500 MG PO TABS: 1000.0000 mg | ORAL_TABLET | Freq: Once | ORAL | Status: DC

## 2023-05-08 NOTE — ED Notes (Signed)
 Called ICU to give report. This RN left her ext w/ clerk for RN to callback/.

## 2023-05-08 NOTE — Progress Notes (Signed)
 ANTICOAGULATION CONSULT NOTE  Pharmacy Consult for heparin infusion Indication: {Indications:3041533}  Allergies  Allergen Reactions   Bee Venom Anaphylaxis and Swelling   Hornet Venom Shortness Of Breath and Swelling   Influenza Vaccines Other (See Comments)    Seizures    Peanuts [Peanut Oil] Anaphylaxis   Phenytoin Hives and Rash   Valproic Acid Hives and Rash   Lamotrigine Rash    Patient Measurements: Height: 5\' 10"  (177.8 cm) Weight: 136.1 kg (300 lb) IBW/kg (Calculated) : 73 Heparin Dosing Weight: *** kg  Vital Signs: Temp: 100 F (37.8 C) (03/19 2145) Temp Source: Bladder (03/19 2145) BP: 177/74 (03/19 2145) Pulse Rate: 105 (03/19 2145)  Labs: Recent Labs    05/08/23 1828 05/08/23 1950  HGB 16.1  --   HCT 50.6  --   PLT 128*  --   APTT 37*  --   LABPROT 15.4*  --   INR 1.2  --   CREATININE 2.28*  --   TROPONINIHS 129* 118*    Estimated Creatinine Clearance: 48.5 mL/min (A) (by C-G formula based on SCr of 2.28 mg/dL (H)).   Medical History: Past Medical History:  Diagnosis Date   CAD (coronary artery disease)    a. 04/2015 Cath: LM nl, LAD 10ost/p, RI nl, LCX 10p, OM1/2/3 nl, RCA nl. Nl LV fxn.   Chronic heart failure with preserved ejection fraction (HFpEF) (HCC)    a 03/2021 Echo: EF 60-65%, no rwma, mild LVH, nl RV fxn, mild MR, Ao sclerosis.   Depression    Hyperlipidemia    LVH (left ventricular hypertrophy)    Seizure (HCC)    Sleep apnea    SSS (sick sinus syndrome) (HCC)    a. 04/2015 s/p BSX DC PPM (ser #[id number]).   Vitamin D deficiency     Medications:  PTA Meds: ***  Assessment: Pt is a *** yo *** presenting to ED ***  Goal of Therapy:  {Goals:3041534} {Monitor platelets by anticoagulation protocol:3041561::"Monitor platelets by anticoagulation protocol: Yes"}   Plan:  No initial bolus Start heparin infusion at 1750 units/hr Will follow aPTT until correlation w/ HL confirmed Will check aPTT in 6 hr after start of  infusion HL & CBC daily while on heparin  Otelia Sergeant, PharmD, [facility] 05/08/2023 10:33 PM

## 2023-05-08 NOTE — H&P (Signed)
 NAME:  Seth Tucker, MRN:  [id number], DOB:  12/12/1965, LOS: 0 ADMISSION DATE:  05/08/2023, CONSULTATION DATE:  05/08/23 REFERRING MD:  Dr. Jodie Echevaria, CHIEF COMPLAINT:  Unresponsive & SOB   History of Present Illness:  58 yo M presenting to [facility] ED from home via EMS for evaluation after being found unresponsive, hypoxic with a pulse.   History provided per chart review and sister telephone conference, as patient is unable to participate in interview at this time. Family reported the patient to be in his normal state of health last spoken to 2 days ago on 05/06/23 when he was headed to the [facility] for an annual check up. Sister reports he had no complaints. When asked about any issues with seizure activity, she said family was concerned that maybe he was continuing to have seizures. She stated the patient would describe falling asleep in his car and waking up 3 hours later with no memory of what occurred and that his face/head would be sore as if he had hit the steering wheel somehow. She believes he has only been taking Phenobarbital. She reports he is a current everyday smoker at about a pack a day, but is trying to quit with a 40 + pack year history. She denies ETOH or recreational drug use.  A wellness check was called and the patient was found by EMS unresponsive, hypoxic next to the door- somewhat blocking the entrance. Patient then woke up and was lethargic but following commands. EMS reported diffuse wheezing with continued hypoxia. They administered solu medrol and albuterol.  Upon arrival to ED patient was responsive reporting earlier dyspnea, some chest tightness and a new cough. He denied fevers but did endorse BLE edema.  ED course: Upon arrival patient responsive, mildly hypertensive, tachypneic, tachycardic and febrile. Suspected sepsis and AECOPD, placed on BIPAP for support. While on BIPAP patient became acutely hypoxic with desaturation into the 60's. Emergently intubated and place  on mechanical ventilatory support. Labs significant for AKI, respiratory acidosis, elevated troponin, Transaminitis, leukocytosis with beginnings of a left shift. CXR concerning for right basilar pneumonia with suspected para mnemonic effusion. CT demonstrated multifocal pneumonia, non obstructing calculi and hepatic steatosis. CT head and spine negative for acute abnormality. Medications given: Tylenol, ASA, etomidate & rocuronium, duo neb, azithromycin/ doxycycline / ceftriaxone, 500 mL bolus, propofol drip started Initial Vitals: 101.8, 27, 102, 160/68 & 99% on BIPAP 70% Significant labs: (Labs/ Imaging personally reviewed) I, Cheryll Cockayne Rust-Chester, AGACNP-BC, personally viewed and interpreted this ECG. EKG Interpretation: Date: 05/08/23, EKG Time: 18:17, Rate: 99, Rhythm: NSR, QRS Axis: normal, Intervals: normal, ST/T Wave abnormalities: none, Narrative Interpretation: NSR  Chemistry: Na+: 136, K+: 5.1, BUN/Cr.: 56/2.28, Serum CO2/ AG: 23/9, AST/ALT: 97/ 55 Hematology: WBC: 14.6, Hgb: 16.1,  Troponin: 129, BNP: 22.9, Lactic/ PCT: 1.6/ pending,  COVID-19 & Influenza A/B: negative  VBG: 7.12/ 80/ 44/ 26 CXR 05/08/23: Findings consistent with right basilar pneumonia and small para-pneumonic effusion CT head wo contrast 05/08/23: No acute intracranial abnormality. Mild chronic microvascular ischemic disease with a few small remote lacunar infarcts about the thalami and right pons. CT cervical spine 05/08/23: no acute fracture in the cervical spine. CT chest/abdomen/pelvis wo contrast 05/08/23: Extensive multifocal ground-glass and consolidative opacities throughout both lungs, greatest in the right middle lobe and lingula. Findings are consistent with multifocal pneumonia. Trace right pleural effusion. Mediastinal adenopathy, likely reactive. No acute abnormality in the abdomen/pelvis. Hepatic steatosis. Nonobstructing right renal stone.  PCCM consulted for admission due to acute  hypoxic/  hypercapnic respiratory failure secondary to right basilar pneumonia and suspected small para-pnemonic effusion requiring emergent intubation and mechanical ventilatory support.  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  Interim History / Subjective:  Patient sedated after intubation, RASS: -5 on mechanical ventilatory support.   Objective   Blood pressure (!) 130/56, pulse (!) 110, temperature (!) 100.4 F (38 C), temperature source Core, resp. rate (!) 24, height 5\' 10"  (1.778 m), weight 136.1 kg, SpO2 95%.       No intake or output data in the 24 hours ending 05/08/23 2106 Filed Weights   05/08/23 1752  Weight: 136.1 kg    Examination: General: Adult male, critically ill, lying in bed intubated & sedated requiring mechanical ventilation, NAD HEENT: MM pink/moist, red schlera, atraumatic, neck supple Neuro: Sedated, RASS: -5, unable to follow commands, ipsilateral pupils: L #1-2(challenging but possibly non reactive) R #5 (sluggish) CV: s1s2 RRR, NSR on monitor, no r/m/g Pulm: Regular, non labored on PRVC 70% & PEEP 5, breath sounds coarse crackles-BUL & diminished-BLL GI: soft, rounded, bs x 4 GU: foley in place with clear yellow urine Skin:  no rashes/lesions noted Extremities: warm/dry, pulses + 2 R/P, +1 edema noted BLE  Resolved Hospital Problem list     Assessment & Plan:  Acute Hypoxic / Hypercapnic Respiratory Failure secondary to multifocal pneumonia & AECOPD in the setting of acute encephalopathy and suspected aspiration PMHx: OSA on CPAP -  Ventilator settings: PRVC  8 mL/kg, 70% FiO2, 5 PEEP, continue ventilator support & lung protective strategies - Wean PEEP & FiO2 as tolerated, maintain SpO2 > 90% - Head of bed elevated 30 degrees, VAP protocol in place - Plateau pressures less than 30 cm H20  - Intermittent chest x-ray & ABG PRN - Daily WUA with SBT as tolerated  - Ensure adequate pulmonary hygiene  - F/u cultures, trend PCT - Continue CAP/Aspiration Pna coverage: unasyn, azithromycin & vancomycin - Steroids initiated: solu-medrol 40 mg BID  - Pulmicort BID, Duo neb Q 6, bronchodilators PRN - PAD protocol in place: continue Fentanyl IVP & Propofol drip  Leukocytosis  SIRS with suspected Sepsis due to multifocal pneumonia  Query aspiration? Initial interventions/workup included: 500 L of NS/LR & Ceftriaxone/ Azithromycin/ Doxycycline - Supplemental oxygen as needed, to maintain SpO2 > 90% - f/u cultures, trend lactic/ PCT - Daily CBC, monitor WBC/ fever curve - IV antibiotics: azithromycin, unasyn & vancomycin  - IVF hydration as needed, 2nd 500 mL bolus ordered, follow lactic trend for additional IVF  Acute Kidney Injury  Baseline Cr: 0.79, Cr on admission: 2.28 - Strict I/O's: alert provider if UOP < 0.5 mL/kg/hr - gentle IVF hydration  - Daily BMP, replace electrolytes PRN - Avoid nephrotoxic agents as able, ensure adequate renal perfusion - consider renal US PRN  Query Syncope? Chronic HFpEF without exacerbation Elevated Troponin but flat suspect secondary to demand ischemia HTN PMHx: CAD, SSS s/p PPM, HLD - Echocardiogram ordered - heparin drip per pharmacy consult for Hx of PE - hold outpatient regimen: apixaban, losartan, carvedilol- consider restarting as patient stabilizes - labetalol IV PRN for SBP > 165 - continuous cardiac monitoring - Consider  Cardiology consultation depending on echo results  Acute Encephalopathy due to unknown etiology Chronic Seizure Disorder Concern for seizure activity  leading to unresponsive episode. However lactic so far WNL.  - continue outpatient phenobarbital, monitor LFT's closely - phenobarbital level pending - avoid sedating medications as tolerated - consult neurology depending on work up results - EEG ordered in the AM  Chronic Anticoagulation due to history of PE -start heparin drip - f/u US BLE to r/o DVT, unable to scan for PE due to AKI  Transaminitis  - Trend hepatic function - Consider RUQ [place] PRN - avoid hepatotoxic agents  Best Practice (right click and "Reselect all SmartList Selections" daily)  Diet/type: NPO w/ meds via tube DVT prophylaxis systemic heparin Pressure ulcer(s): N/A GI prophylaxis: H2B Lines: N/A Foley:  Yes, and it is still needed Code Status:  full code Last date of multidisciplinary goals of care discussion [05/08/23] Sister updated via telephone conference, plan of care discussed. All questions and concerns answered at this time. Labs   CBC: Recent Labs  Lab 05/08/23 1828  WBC 14.6*  NEUTROABS 11.5*  HGB 16.1  HCT 50.6  MCV 90.2  PLT 128*    Basic Metabolic Panel: Recent Labs  Lab 05/08/23 1828  NA 136  K 5.1  CL 104  CO2 23  GLUCOSE 135*  BUN 56*  CREATININE 2.28*  CALCIUM 7.8*   GFR: Estimated Creatinine Clearance: 48.5 mL/min (A) (by C-G formula based on SCr of 2.28 mg/dL (H)). Recent Labs  Lab 05/08/23 1828  WBC 14.6*  LATICACIDVEN 1.6    Liver Function Tests: Recent Labs  Lab 05/08/23 1828  AST 97*  ALT 55*  ALKPHOS 62  BILITOT 0.8  PROT 8.0  ALBUMIN 3.5   No results for input(s): "LIPASE", "AMYLASE" in the last 168 hours. No results for input(s): "AMMONIA" in the last 168 hours.  ABG    Component Value Date/Time   HCO3 26.0 05/08/2023 1828   ACIDBASEDEF 5.2 (H) 05/08/2023 1828   O2SAT 68.4 05/08/2023 1828     Coagulation Profile: Recent Labs  Lab 05/08/23 1828  INR 1.2    Cardiac Enzymes: No results for input(s): "CKTOTAL", "CKMB", "CKMBINDEX",  "TROPONINI" in the last 168 hours.  HbA1C: No results found for: "HGBA1C"  CBG: No results for input(s): "GLUCAP" in the last 168 hours.  Review of Systems:   UTA- patient intubated and unable to participate in interview at this time.  Past Medical History:  He,  has a past medical history of CAD (coronary artery disease), Chronic heart failure with preserved ejection fraction (HFpEF) (HCC), Depression, Hyperlipidemia, LVH (left ventricular hypertrophy), Seizure (HCC), Sleep apnea, SSS (sick sinus syndrome) (HCC), and Vitamin D deficiency.   Surgical History:   Past Surgical History:  Procedure Laterality Date   CARDIAC CATHETERIZATION N/A 05/04/2015   Procedure: Left Heart Cath and Coronary Angiography;  Surgeon: Iran Ouch, MD;  Location: [facility] INVASIVE CV LAB;  Service: Cardiovascular;  Laterality: N/A;   EP IMPLANTABLE DEVICE N/A 05/04/2015   Procedure: Pacemaker Implant;  Surgeon: Marinus Maw, MD;  Location: [facility] INVASIVE CV LAB;  Service: Cardiovascular;  Laterality: N/A;   PPM GENERATOR CHANGEOUT N/A 01/23/2023   Procedure: PPM GENERATOR CHANGEOUT;  Surgeon: Marinus Maw, MD;  Location: [facility] INVASIVE CV LAB;  Service: Cardiovascular;  Laterality: N/A;     Social History:   reports that he has been smoking cigarettes. He has a 10 pack-year smoking history. He has never used smokeless tobacco. He  reports that he does not drink alcohol and does not use drugs.   Family History:  His family history includes Diabetes in an other family member; Emphysema in his mother; Heart disease in an other family member; Hypertension in his brother and mother.   Allergies Allergies  Allergen Reactions   Bee Venom Anaphylaxis and Swelling   Hornet Venom Shortness Of Breath and Swelling   Influenza Vaccines Other (See Comments)    Seizures    Peanuts [Peanut Oil] Anaphylaxis   Phenytoin Hives and Rash   Valproic Acid Hives and Rash   Lamotrigine Rash     Home Medications  Prior to  Admission medications   Medication Sig Start Date End Date Taking? Authorizing Provider  albuterol (PROVENTIL HFA;VENTOLIN HFA) 108 (90 Base) MCG/ACT inhaler Inhale 2 puffs into the lungs every 4 (four) hours as needed for wheezing or shortness of breath.    [provider]  amoxicillin-clavulanate (AUGMENTIN) 875-125 MG tablet Take 1 tablet by mouth 2 (two) times daily. 03/27/22   [provider]  apixaban (ELIQUIS) 5 MG TABS tablet Take 1 tablet (5 mg total) by mouth 2 (two) times daily. 07/03/22   Marinus Maw, MD  aspirin EC 81 MG EC tablet Take 1 tablet (81 mg total) by mouth daily. 05/05/15   Sheilah Pigeon, PA-C  atorvastatin (LIPITOR) 40 MG tablet Take 40 mg by mouth daily. 02/25/15   [provider]  carvedilol (COREG) 6.25 MG tablet Take 1 tablet (6.25 mg total) by mouth 2 (two) times daily with a meal. 03/29/22   Marrion Coy, MD  cholecalciferol (VITAMIN D) 1000 units tablet Take 1,000 Units by mouth daily.    [provider]  EPINEPHrine 0.3 mg/0.3 mL IJ SOAJ injection Inject 0.3 mg into the muscle as needed for anaphylaxis. Follow package instructions as needed for severe allergy or anaphylactic reaction. 10/24/22   Sharman Cheek, MD  Fluticasone-Salmeterol (ADVAIR) 250-50 MCG/DOSE AEPB Inhale 1 puff into the lungs 2 (two) times daily.    [provider]  furosemide (LASIX) 40 MG tablet Take 1.5 tablets (60 mg total) by mouth daily. 07/03/22   Marinus Maw, MD  HYDROcodone-acetaminophen (NORCO/VICODIN) 5-325 MG tablet Take 1 tablet by mouth 3 (three) times daily as needed. 01/30/23   [provider]  ketoconazole (NIZORAL) 2 % cream Apply to both feet and between toes once daily for 6 weeks. 07/09/22   Freddie Breech, DPM  losartan (COZAAR) 50 MG tablet Take 1 tablet (50 mg total) by mouth daily. 03/29/22   Marrion Coy, MD  nitroGLYCERIN (NITROSTAT) 0.4 MG SL tablet Place 1 tablet (0.4 mg total) under the tongue every 5 (five)  minutes as needed for chest pain. 04/28/15   Almond Lint, MD  oxyCODONE-acetaminophen (PERCOCET/ROXICET) 5-325 MG tablet Take 1 tablet by mouth every 6 (six) hours as needed for moderate pain. 03/29/22   Marrion Coy, MD  PHENObarbital (LUMINAL) 100 MG tablet Take 200 mg by mouth at bedtime.     [provider]     Critical care time: 68 minutes      Betsey Holiday, AGACNP-BC Acute Care Nurse Practitioner [facility]   [phone] / [phone] Please see Amion for details.

## 2023-05-08 NOTE — Progress Notes (Signed)
 CODE SEPSIS - PHARMACY COMMUNICATION  **Broad Spectrum Antibiotics should be administered within 1 hour of Sepsis diagnosis**  Time Code Sepsis Called/Page Received: 1819  Antibiotics Ordered: ceftriaxone  Time of 1st antibiotic administration: 1900  Additional action taken by pharmacy:    If necessary, Name of Provider/Nurse Contacted:      Angelique Blonder ,PharmD Clinical Pharmacist  05/08/2023  7:22 PM

## 2023-05-08 NOTE — ED Notes (Signed)
 Unable to perform swallow screen at this time. Provider notified.

## 2023-05-08 NOTE — ED Triage Notes (Signed)
 Patient comes in from home via [facility] due to possibly being post ictal. According to EMS, the patient was found by [facility] after performing a wellness check. According to people on scene, the patient has a history of seizures, and the state the patient was in at that time appeared that he could have had a seizure. Pt was found to be 78% on RA with wheezing noted when EMS arrived at the home. Patient given 125mg  of solumedrol, and 1 albuterol treatment which he is still receiving on arrival. Pt is minimally responsive at this time. MD Tan at bedside.  EMS Vitals 130/82 30 RR 100 HR 174 BGC

## 2023-05-08 NOTE — ED Notes (Signed)
 RT called at [phone] for Bipap alarming. Pt oxygen saturation 88% when RT called. RT arrived and pt was hypoxic @76 %, provider notified and immediately at bedside for intubation. No seizure like activity noted during hypoxic state

## 2023-05-08 NOTE — ED Notes (Signed)
 RT called for transport. RT in a rapid response at this time.

## 2023-05-08 NOTE — Progress Notes (Signed)
 Pharmacy Antibiotic Note  Seth Tucker is a 58 y.o. male admitted on 05/08/2023 with possible aspiration pneumonia.  Pharmacy has been consulted for Unasyn & Vancomycin dosing.  Plan: Unasyn 3 gm q6hr for 5 days per indication & renal fxn.  Pt given Vancomycin 2500 mg once. Vancomycin 1250 mg IV Q 24 hrs. Goal AUC 400-550. Expected AUC: 532.1 SCr used: 2.28, Vd used: 0.5, BMI: 43.2  Pharmacy will continue to follow and will adjust abx dosing whenever warranted.  Temp (24hrs), Avg:100.7 F (38.2 C), Min:100 F (37.8 C), Max:101.8 F (38.8 C)   Recent Labs  Lab 05/08/23 1828 05/08/23 1950  WBC 14.6*  --   CREATININE 2.28*  --   LATICACIDVEN 1.6 1.9    Estimated Creatinine Clearance: 48.5 mL/min (A) (by C-G formula based on SCr of 2.28 mg/dL (H)).    Allergies  Allergen Reactions   Bee Venom Anaphylaxis and Swelling   Hornet Venom Shortness Of Breath and Swelling   Influenza Vaccines Other (See Comments)    Seizures    Peanuts [Peanut Oil] Anaphylaxis   Phenytoin Hives and Rash   Valproic Acid Hives and Rash   Lamotrigine Rash    Antimicrobials this admission: 3/19 Azithromycin >> x 5 days 3/19 Ceftriaxone >> x 1 dose 3/19 Doxycycline >> x 1 dose 3/20 Unasyn >> x 5 days 3/20 Vancomycin >>  Microbiology results: 3/19 BCx: Pending 3/19 RespCx: Sent  Thank you for allowing pharmacy to be a part of this patient's care.  Otelia Sergeant, PharmD, [facility] 05/08/2023 11:25 PM

## 2023-05-08 NOTE — Progress Notes (Signed)
 PHARMACY CONSULT NOTE - ELECTROLYTES  Pharmacy Consult for Electrolyte Monitoring and Replacement   Recent Labs: Height: 5\' 10"  (177.8 cm) Weight: 136.1 kg (300 lb) IBW/kg (Calculated) : 73 Estimated Creatinine Clearance: 48.5 mL/min (A) (by C-G formula based on SCr of 2.28 mg/dL (H)). Potassium (mmol/L)  Date Value  05/08/2023 5.1   Calcium (mg/dL)  Date Value  96/05/5407 7.8 (L)   Albumin (g/dL)  Date Value  81/19/1478 3.5   Sodium (mmol/L)  Date Value  05/08/2023 136  01/11/2023 144   Corrected Ca: 8.2 mg/dL   (calcium 7.8  alb 3.5)  Assessment  Seth Tucker is a 58 y.o. male presenting with possible seizure. PMH significant for CHF, COPD, seizures on phenobarbital. Pharmacy has been consulted to monitor and replace electrolytes. intubated  Diet: - MIVF: on fentanyl, norepinephrine, propofol Pertinent medications:    Goal of Therapy: Electrolytes WNL  Plan:  No electrolyte replacement at this time Check BMP, Mg, Phos with AM labs  Thank you for allowing pharmacy to be a part of this patient's care.  Angelique Blonder, PharmD Clinical Pharmacist 05/08/2023 8:49 PM

## 2023-05-08 NOTE — ED Provider Notes (Addendum)
 [facility] Provider Note    Event Date/Time   First MD Initiated Contact with Patient 05/08/23 1748     (approximate)   History   Shortness of Breath   HPI  Seth Tucker is a 58 y.o. male history of CHF, COPD, seizures on phenobarbital presenting with shortness of breath.  Per EMS, a wellness check was called and patient, they found him unresponsive, tachypneic, hypoxic.  They spoke to family who states that patient is typically like this after a seizure.  He said that patient woke up, was a little bit sleepy but following commands, was hypoxic to the low 90s for them, was wheezing diffusely, they gave him albuterol treatment and Solu-Medrol.  Patient states that he was having some shortness of breath earlier, had some chest tightness and a cough that is new.  Does have lower extremity swelling.  No fevers.  History obtained from EMS.\  On independent chart review, he was seen by his cardiologist in November, has history of sick sinus rhythm status post PPM, does have history of PE on Eliquis.    Independent history obtained from patient's brother, patient does use CPAP machine at night, lives at home alone, last on her normal 2 days ago.  States that patient was found next to the door and this where they had some trouble getting into find him.  Unsure if he has been intubated in the past since patient has been chronically ill and going in and out of the hospital frequently.  Physical Exam   Triage Vital Signs: ED Triage Vitals  Encounter Vitals Group     BP      Systolic BP Percentile      Diastolic BP Percentile      Pulse      Resp      Temp      Temp src      SpO2      Weight      Height      Head Circumference      Peak Flow      Pain Score      Pain Loc      Pain Education      Exclude from Growth Chart     Most recent vital signs: Vitals:   05/08/23 2040 05/08/23 2048  BP: 105/63 (!) 130/56  Pulse:  (!) 110  Resp:  (!) 24   Temp:    SpO2:  95%     General: Eyes closed but awakes to voice, able to answer questions, follows commands CV:  Good peripheral perfusion.  Resp:  Normal effort.  Diffuse wheezing Abd:  No distention.  Soft nontender Other:  Bilateral lower extremity edema that appears symmetrical.  No palpable skull deformities or tenderness, no thoracic cage tenderness, no tenderness to his extremities.  No midline spinal tenderness.  Pupils appear equal.   ED Results / Procedures / Treatments   Labs (all labs ordered are listed, but only abnormal results are displayed) Labs Reviewed  COMPREHENSIVE METABOLIC PANEL - Abnormal; Notable for the following components:      Result Value   Glucose, Bld 135 (*)    BUN 56 (*)    Creatinine, Ser 2.28 (*)    Calcium 7.8 (*)    AST 97 (*)    ALT 55 (*)    GFR, Estimated 32 (*)    All other components within normal limits  CBC WITH DIFFERENTIAL/PLATELET - Abnormal; Notable for the  following components:   WBC 14.6 (*)    Platelets 128 (*)    Neutro Abs 11.5 (*)    Abs Immature Granulocytes 0.09 (*)    All other components within normal limits  BLOOD GAS, VENOUS - Abnormal; Notable for the following components:   pH, Ven 7.12 (*)    pCO2, Ven 80 (*)    Acid-base deficit 5.2 (*)    All other components within normal limits  PROTIME-INR - Abnormal; Notable for the following components:   Prothrombin Time 15.4 (*)    All other components within normal limits  APTT - Abnormal; Notable for the following components:   aPTT 37 (*)    All other components within normal limits  URINALYSIS, W/ REFLEX TO CULTURE (INFECTION SUSPECTED) - Abnormal; Notable for the following components:   Color, Urine AMBER (*)    APPearance CLOUDY (*)    Hgb urine dipstick MODERATE (*)    Protein, ur 100 (*)    Bacteria, UA RARE (*)    All other components within normal limits  TROPONIN I (HIGH SENSITIVITY) - Abnormal; Notable for the following components:   Troponin I  (High Sensitivity) 129 (*)    All other components within normal limits  RESP PANEL BY RT-PCR (RSV, FLU A&B, COVID)  RVPGX2  CULTURE, BLOOD (ROUTINE X 2)  CULTURE, BLOOD (ROUTINE X 2)  CULTURE, RESPIRATORY W GRAM STAIN  BRAIN NATRIURETIC PEPTIDE  LACTIC ACID, PLASMA  PHENOBARBITAL LEVEL  LACTIC ACID, PLASMA  HIV ANTIBODY (ROUTINE TESTING W REFLEX)  CBC  MAGNESIUM  PHOSPHORUS  BASIC METABOLIC PANEL  TRIGLYCERIDES  URINE DRUG SCREEN, QUALITATIVE ([facility] ONLY)  HEPATIC FUNCTION PANEL  BLOOD GAS, ARTERIAL  TROPONIN I (HIGH SENSITIVITY)     EKG  Sinus rhythm, rate 99, no QRS, normal QTc, T wave flattening in 1, aVL, no ischemic ST elevation, not significant change compared to prior   RADIOLOGY Chest x-ray on my interpretation concerning for bilateral opacities.   PROCEDURES:  Critical Care performed: Yes, see critical care procedure note(s)  .Critical Care  Performed by: Claybon Jabs, MD Authorized by: Claybon Jabs, MD   Critical care provider statement:    Critical care time (minutes):  40   Critical care was necessary to treat or prevent imminent or life-threatening deterioration of the following conditions:  Respiratory failure and sepsis   Critical care was time spent personally by me on the following activities:  Development of treatment plan with patient or surrogate, discussions with consultants, evaluation of patient's response to treatment, examination of patient, ordering and review of laboratory studies, ordering and review of radiographic studies, ordering and performing treatments and interventions, pulse oximetry, re-evaluation of patient's condition and review of old charts Procedure Name: Intubation Date/Time: 05/08/2023 8:59 PM  Performed by: Claybon Jabs, MDTube size: 7.5 mm Number of attempts: 1 Placement Confirmation: ETT inserted through vocal cords under direct vision, Positive ETCO2, CO2 detector and Breath sounds checked- equal and bilateral Secured  at: 21 cm Tube secured with: ETT holder Difficulty Due To: Difficult Airway- due to large tongue Comments: Patient just removed prior to intubation.       MEDICATIONS ORDERED IN ED: Medications  doxycycline (VIBRAMYCIN) 100 mg in sodium chloride 0.9 % 250 mL IVPB (100 mg Intravenous New Bag/Given 05/08/23 1945)  propofol (DIPRIVAN) 1000 MG/100ML infusion (20 mcg/kg/min  136.1 kg Intravenous Rate/Dose Change 05/08/23 2055)  fentaNYL in NS (91mcg/ml) infusion-PREMIX (has no administration in time range)  norepinephrine (LEVOPHED)  4mg  in (0.016 mg/mL) premix infusion (has no administration in time range)  famotidine (PEPCID) tablet 20 mg (has no administration in time range)  acetaminophen (TYLENOL) tablet 650 mg (has no administration in time range)  docusate (COLACE) 50 MG/5ML liquid 100 mg (has no administration in time range)  polyethylene glycol (MIRALAX / GLYCOLAX) packet 17 g (has no administration in time range)  fentaNYL (SUBLIMAZE) bolus via infusion 50-100 mcg (has no administration in time range)  midazolam (VERSED) injection 1-2 mg (has no administration in time range)  0.9 %  sodium chloride infusion (has no administration in time range)  docusate (COLACE) 50 MG/5ML liquid 100 mg (has no administration in time range)  polyethylene glycol (MIRALAX / GLYCOLAX) packet 17 g (has no administration in time range)  ipratropium-albuterol (DUONEB) 0.5-2.5 (3) MG/3ML nebulizer solution 9 mL (9 mLs Nebulization Given 05/08/23 1833)  lactated ringers bolus 500 mL (0 mLs Intravenous Stopped 05/08/23 2019)  cefTRIAXone (ROCEPHIN) 2 g in sodium chloride 0.9 % 100 mL IVPB (0 g Intravenous Stopped 05/08/23 1928)  azithromycin (ZITHROMAX) 500 mg in sodium chloride 0.9 % 250 mL IVPB (0 mg Intravenous Stopped 05/08/23 2028)  aspirin suppository 300 mg (300 mg Rectal Given 05/08/23 1943)  acetaminophen (TYLENOL) suppository 650 mg (650 mg Rectal Given 05/08/23 1944)  etomidate  (AMIDATE) injection 40 mg (40 mg Intravenous Given 05/08/23 2007)  rocuronium (ZEMURON) injection 100 mg (100 mg Intravenous Given 05/08/23 2009)  lactated ringers bolus 500 mL (500 mLs Intravenous New Bag/Given 05/08/23 2040)     IMPRESSION / MDM / ASSESSMENT AND PLAN / ED COURSE  I reviewed the triage vital signs and the nursing notes.                              Differential diagnosis includes, but is not limited to, COPD exacerbation, CHF exacerbation, seizure, electrolyte derangements, hypercarbic respiratory failure, hypoxemic respiratory failure, other infection such as pneumonia, UTI, viral illness.  Also considered atypical ACS.  Will get labs, EKG, troponin, chest x-ray will give him some DuoNebs here.  He was [facility].  Will get a CT head since is unclear if he fell and he is on blood thinners, I suspect his initial sleepiness was due to being postictal and he is now slightly more awake and answering questions appropriately.  Will reassess but he will likely need to be admitted for further management.  Patient's presentation is most consistent with acute presentation with potential threat to life or bodily function.  Patient found to be febrile, sepsis order set placed, IV antibiotics placed for possible pneumonia given his cough and respiratory issues, did a bedside ultrasound that showed sparse B-lines on the left, no B-lines on the right, IVC is collapsing.  Unable to assess cardiac use this is a technically limited study due to habitus.  Updated brother at bedside with the plan of care as well as laboratory and clinical findings so far.  Discussed with brother that we we will have to put in BiPAP due to his respiratory acidosis, if he gets worse or tires out, he will need to be intubated.  Independent review of labs imaging are below.  Troponin is also elevated no he had no chest pain complaints, only chest tightness, suspect this is demand ischemia, also noted of AKI,  electrolytes not severely deranged, his INR is not elevated, his PT is mildly elevated, he has a leukocytosis, lactic is not elevated, BNP  is normal.  Was called to bedside because patient started become hypoxic to the 60s, tidal volume went down, not responsive as before.  Patient was set up for intubation due to hypoxemic respiratory failure and mental status change.  He was intubated using RSI, with roc and etomidate.  Intubation was successfully done, noted color change to CO2, bilateral breath sounds.  ICU was consulted for admission and will evaluate the patient.  He is admitted.  Clinical Course as of 05/08/23 2114  Wed May 08, 2023  1852 Blood gas, venous(!!) Respiratory acidosis, respiratory therapist was called to set patient up on BiPAP. [TT]  1908 DG Chest Port 1 View IMPRESSION: 1. Findings consistent with right basilar pneumonia and small parapneumonic effusion.   [TT]  2104 Called patient sister to give her an update, no answer and left her voice message for her to call back. [TT]  2113 Spoke to sister and updated her on the clinical course as well as a plan fluting that he was intubated, needed ICU later noted that his pupils were irregular and that he is getting CAT scan of the head.  She will come and visit him tomorrow but if anything changes, she will like to be notified as well. [TT]    Clinical Course User Index [TT] Jodie Echevaria Franchot Erichsen, MD     FINAL CLINICAL IMPRESSION(S) / ED DIAGNOSES   Final diagnoses:  COPD exacerbation (HCC)  Sepsis, due to unspecified organism, unspecified whether acute organ dysfunction present (HCC)  Pneumonia due to infectious organism, unspecified laterality, unspecified part of lung  Acute respiratory failure with hypoxia (HCC)  Acute respiratory failure with hypercapnia (HCC)     Rx / DC Orders   ED Discharge Orders     None        Note:  This document was prepared using Dragon voice recognition software and may include  unintentional dictation errors.    Claybon Jabs, MD 05/08/23 2103    Claybon Jabs, MD 05/08/23 2114

## 2023-05-08 NOTE — ED Notes (Signed)
 Family left bedside at this time. His sister, Dois Davenport, would like to be called with any updates, at any time. Her number is [phone]

## 2023-05-08 NOTE — ED Notes (Signed)
 Tan MD with respiratory Cori at head of bed using 7.5 Tube for intubation.  21@ teeth and 21F OG established with use of glidescope.

## 2023-05-08 NOTE — Sepsis Progress Note (Signed)
 Code Sepsis protocol being monitored by eLink.

## 2023-05-08 NOTE — Plan of Care (Signed)
  Problem: Activity: Goal: Ability to tolerate increased activity will improve Outcome: Progressing   Problem: Role Relationship: Goal: Method of communication will improve Outcome: Progressing   Problem: Health Behavior/Discharge Planning: Goal: Ability to manage health-related needs will improve Outcome: Progressing   Problem: Education: Goal: Knowledge of General Education information will improve Description: Including pain rating scale, medication(s)/side effects and non-pharmacologic comfort measures Outcome: Progressing

## 2023-05-09 ENCOUNTER — Inpatient Hospital Stay: Payer: MEDICAID

## 2023-05-09 DIAGNOSIS — G9340 Encephalopathy, unspecified: Secondary | ICD-10-CM

## 2023-05-09 LAB — HEPARIN LEVEL (UNFRACTIONATED)
Heparin Unfractionated: 0.47 [IU]/mL (ref 0.30–0.70)
Heparin Unfractionated: 0.89 [IU]/mL — ABNORMAL HIGH (ref 0.30–0.70)
Heparin Unfractionated: 0.99 [IU]/mL — ABNORMAL HIGH (ref 0.30–0.70)

## 2023-05-09 LAB — HEPATIC FUNCTION PANEL
ALT: 47 U/L — ABNORMAL HIGH (ref 0–44)
AST: 87 U/L — ABNORMAL HIGH (ref 15–41)
Albumin: 2.9 g/dL — ABNORMAL LOW (ref 3.5–5.0)
Alkaline Phosphatase: 58 U/L (ref 38–126)
Bilirubin, Direct: 0.7 mg/dL — ABNORMAL HIGH (ref 0.0–0.2)
Indirect Bilirubin: 0.8 mg/dL (ref 0.3–0.9)
Total Bilirubin: 1.5 mg/dL — ABNORMAL HIGH (ref 0.0–1.2)
Total Protein: 6.7 g/dL (ref 6.5–8.1)

## 2023-05-09 LAB — BASIC METABOLIC PANEL
Anion gap: 9 (ref 5–15)
Anion gap: 9 (ref 5–15)
BUN: 64 mg/dL — ABNORMAL HIGH (ref 6–20)
BUN: 69 mg/dL — ABNORMAL HIGH (ref 6–20)
CO2: 21 mmol/L — ABNORMAL LOW (ref 22–32)
CO2: 21 mmol/L — ABNORMAL LOW (ref 22–32)
Calcium: 7.2 mg/dL — ABNORMAL LOW (ref 8.9–10.3)
Calcium: 7.1 mg/dL — ABNORMAL LOW (ref 8.9–10.3)
Chloride: 105 mmol/L (ref 98–111)
Chloride: 106 mmol/L (ref 98–111)
Creatinine, Ser: 2.84 mg/dL — ABNORMAL HIGH (ref 0.61–1.24)
Creatinine, Ser: 3.41 mg/dL — ABNORMAL HIGH (ref 0.61–1.24)
GFR, Estimated: 25 mL/min — ABNORMAL LOW (ref >60)
GFR, Estimated: 20 mL/min — ABNORMAL LOW (ref >60)
Glucose, Bld: 155 mg/dL — ABNORMAL HIGH (ref 70–99)
Glucose, Bld: 157 mg/dL — ABNORMAL HIGH (ref 70–99)
Potassium: 5.9 mmol/L — ABNORMAL HIGH (ref 3.5–5.1)
Potassium: 5.5 mmol/L — ABNORMAL HIGH (ref 3.5–5.1)
Sodium: 135 mmol/L (ref 135–145)
Sodium: 136 mmol/L (ref 135–145)

## 2023-05-09 LAB — BLOOD GAS, VENOUS
Acid-base deficit: 5.8 mmol/L — ABNORMAL HIGH (ref 0.0–2.0)
Acid-base deficit: 6.4 mmol/L — ABNORMAL HIGH (ref 0.0–2.0)
Acid-base deficit: 5.2 mmol/L — ABNORMAL HIGH (ref 0.0–2.0)
Bicarbonate: 24.2 mmol/L (ref 20.0–28.0)
Bicarbonate: 22.1 mmol/L (ref 20.0–28.0)
Bicarbonate: 21.6 mmol/L (ref 20.0–28.0)
FIO2: 60 %
MECHVT: 550 mL
O2 Saturation: 96.4 %
O2 Saturation: 94.4 %
O2 Saturation: 99.9 %
PEEP: 10 cmH2O
Patient temperature: 37
Patient temperature: 37
Patient temperature: 37
pCO2, Ven: 68 mmHg — ABNORMAL HIGH (ref 44–60)
pCO2, Ven: 54 mmHg (ref 44–60)
pCO2, Ven: 46 mmHg (ref 44–60)
pH, Ven: 7.16 — CL (ref 7.25–7.43)
pH, Ven: 7.22 — ABNORMAL LOW (ref 7.25–7.43)
pH, Ven: 7.28 (ref 7.25–7.43)
pO2, Ven: 69 mmHg — ABNORMAL HIGH (ref 32–45)
pO2, Ven: 67 mmHg — ABNORMAL HIGH (ref 32–45)
pO2, Ven: 114 mmHg — ABNORMAL HIGH (ref 32–45)

## 2023-05-09 LAB — RESPIRATORY PANEL BY PCR

## 2023-05-09 LAB — BLOOD CULTURE ID PANEL (REFLEXED) - BCID2

## 2023-05-09 LAB — ETHANOL: Alcohol, Ethyl (B): <10 mg/dL (ref <10)

## 2023-05-09 LAB — CBC
HCT: 47.9 % (ref 39.0–52.0)
Hemoglobin: 15.1 g/dL (ref 13.0–17.0)
MCH: 28.5 pg (ref 26.0–34.0)
MCHC: 31.5 g/dL (ref 30.0–36.0)
MCV: 90.4 fL (ref 80.0–100.0)
Platelets: 122 10*3/uL — ABNORMAL LOW (ref 150–400)
RBC: 5.3 MIL/uL (ref 4.22–5.81)
RDW: 14.6 % (ref 11.5–15.5)
WBC: 13.9 10*3/uL — ABNORMAL HIGH (ref 4.0–10.5)
nRBC: 0.1 % (ref 0.0–0.2)

## 2023-05-09 LAB — GLUCOSE, CAPILLARY
Glucose-Capillary: 161 mg/dL — ABNORMAL HIGH (ref 70–99)
Glucose-Capillary: 140 mg/dL — ABNORMAL HIGH (ref 70–99)
Glucose-Capillary: 150 mg/dL — ABNORMAL HIGH (ref 70–99)
Glucose-Capillary: 195 mg/dL — ABNORMAL HIGH (ref 70–99)
Glucose-Capillary: 163 mg/dL — ABNORMAL HIGH (ref 70–99)
Glucose-Capillary: 165 mg/dL — ABNORMAL HIGH (ref 70–99)

## 2023-05-09 LAB — STREP PNEUMONIAE URINARY ANTIGEN: Strep Pneumo Urinary Antigen: NEGATIVE

## 2023-05-09 LAB — PROCALCITONIN: Procalcitonin: 6.68 ng/mL

## 2023-05-09 LAB — APTT
aPTT: 99 s — ABNORMAL HIGH (ref 24–36)
aPTT: 192 s (ref 24–36)
aPTT: 171 s (ref 24–36)

## 2023-05-09 LAB — HEMOGLOBIN A1C
Hgb A1c MFr Bld: 6.6 % — ABNORMAL HIGH (ref 4.8–5.6)
Mean Plasma Glucose: 142.72 mg/dL

## 2023-05-09 LAB — TRIGLYCERIDES: Triglycerides: 120 mg/dL (ref <150)

## 2023-05-09 LAB — AMMONIA: Ammonia: 92 umol/L — ABNORMAL HIGH (ref 9–35)

## 2023-05-09 LAB — PHOSPHORUS: Phosphorus: 5.1 mg/dL — ABNORMAL HIGH (ref 2.5–4.6)

## 2023-05-09 LAB — HIV ANTIBODY (ROUTINE TESTING W REFLEX): HIV Screen 4th Generation wRfx: NONREACTIVE

## 2023-05-09 LAB — POTASSIUM: Potassium: 4.8 mmol/L (ref 3.5–5.1)

## 2023-05-09 LAB — MRSA NEXT GEN BY PCR, NASAL: MRSA by PCR Next Gen: NOT DETECTED

## 2023-05-09 LAB — MAGNESIUM: Magnesium: 2.7 mg/dL — ABNORMAL HIGH (ref 1.7–2.4)

## 2023-05-09 MED ORDER — FREE WATER
30.0000 mL | Status: DC
  Administered 2023-05-09 – 2023-05-10 (×7): 30 mL

## 2023-05-09 MED ORDER — PROSOURCE TF20 ENFIT COMPATIBL EN LIQD
60.0000 mL | Freq: Two times a day (BID) | ENTERAL | Status: DC
  Administered 2023-05-10: 60 mL

## 2023-05-09 MED ORDER — SODIUM BICARBONATE 8.4 % IV SOLN
50.0000 meq | Freq: Once | INTRAVENOUS | Status: AC
  Administered 2023-05-09: 50 meq via INTRAVENOUS
  Filled 2023-05-09: qty 50

## 2023-05-09 MED ORDER — SODIUM BICARBONATE 8.4 % IV SOLN
100.0000 meq | Freq: Once | INTRAVENOUS | Status: AC
  Administered 2023-05-09: 100 meq via INTRAVENOUS
  Filled 2023-05-09: qty 50

## 2023-05-09 MED ORDER — CHLORHEXIDINE GLUCONATE CLOTH 2 % EX PADS: 6.0000 | MEDICATED_PAD | Freq: Every day | CUTANEOUS | Status: DC

## 2023-05-09 MED ORDER — ORAL CARE MOUTH RINSE: 15.0000 mL | OROMUCOSAL | Status: DC | PRN

## 2023-05-09 MED ORDER — VITAL AF 1.2 CAL PO LIQD
1000.0000 mL | ORAL | Status: DC
  Administered 2023-05-09: 1000 mL

## 2023-05-09 MED ORDER — INSULIN ASPART 100 UNIT/ML IV SOLN
10.0000 [IU] | Freq: Once | INTRAVENOUS | Status: AC
  Administered 2023-05-09: 10 [IU] via INTRAVENOUS
  Filled 2023-05-09: qty 0.1

## 2023-05-09 MED ORDER — VANCOMYCIN HCL 1250 MG/250ML IV SOLN: 1250.0000 mg | INTRAVENOUS | Status: DC

## 2023-05-09 MED ORDER — MUPIROCIN 2 % EX OINT
1.0000 | TOPICAL_OINTMENT | Freq: Two times a day (BID) | CUTANEOUS | Status: AC
  Administered 2023-05-09 – 2023-05-13 (×10): 1 via NASAL
  Filled 2023-05-09 (×2): qty 22

## 2023-05-09 MED ORDER — CHLORHEXIDINE GLUCONATE CLOTH 2 % EX PADS
6.0000 | MEDICATED_PAD | Freq: Every day | CUTANEOUS | Status: DC
  Administered 2023-05-09 – 2023-05-15 (×6): 6 via TOPICAL

## 2023-05-09 MED ORDER — SODIUM ZIRCONIUM CYCLOSILICATE 5 G PO PACK
10.0000 g | PACK | Freq: Once | ORAL | Status: AC
  Administered 2023-05-09: 10 g
  Filled 2023-05-09: qty 2

## 2023-05-09 MED ORDER — ORAL CARE MOUTH RINSE
15.0000 mL | OROMUCOSAL | Status: DC
  Administered 2023-05-09 – 2023-05-10 (×16): 15 mL via OROMUCOSAL

## 2023-05-09 MED ORDER — PHENOBARBITAL 100 MG PO TABS
200.0000 mg | ORAL_TABLET | Freq: Every day | ORAL | Status: DC
Start: 2023-05-10 — End: 2023-05-11
  Administered 2023-05-10 – 2023-05-11 (×3): 200 mg
  Filled 2023-05-09 (×5): qty 2

## 2023-05-09 MED ORDER — INSULIN ASPART 100 UNIT/ML IJ SOLN
0.0000 [IU] | INTRAMUSCULAR | Status: DC
  Administered 2023-05-09 – 2023-05-10 (×3): 3 [IU] via SUBCUTANEOUS
  Administered 2023-05-10: 2 [IU] via SUBCUTANEOUS
  Administered 2023-05-10 (×2): 3 [IU] via SUBCUTANEOUS
  Administered 2023-05-11: 2 [IU] via SUBCUTANEOUS
  Administered 2023-05-12 (×2): 3 [IU] via SUBCUTANEOUS
  Administered 2023-05-13 – 2023-05-15 (×7): 2 [IU] via SUBCUTANEOUS
  Administered 2023-05-16: 3 [IU] via SUBCUTANEOUS
  Administered 2023-05-16: 2 [IU] via SUBCUTANEOUS
  Administered 2023-05-16: 5 [IU] via SUBCUTANEOUS
  Administered 2023-05-17: 2 [IU] via SUBCUTANEOUS
  Administered 2023-05-17 – 2023-05-18 (×2): 3 [IU] via SUBCUTANEOUS
  Administered 2023-05-18 – 2023-05-19 (×2): 2 [IU] via SUBCUTANEOUS
  Administered 2023-05-19: 3 [IU] via SUBCUTANEOUS
  Administered 2023-05-19 – 2023-05-20 (×3): 2 [IU] via SUBCUTANEOUS
  Filled 2023-05-09 (×26): qty 1

## 2023-05-09 MED ORDER — DEXTROSE 50 % IV SOLN
1.0000 | Freq: Once | INTRAVENOUS | Status: AC
  Administered 2023-05-09: 50 mL via INTRAVENOUS
  Filled 2023-05-09: qty 50

## 2023-05-09 NOTE — Progress Notes (Signed)
 Made Dr. Karna Christmas aware respiratory panel resulted. Positive for Adenovirus

## 2023-05-09 NOTE — Progress Notes (Signed)
 ANTICOAGULATION CONSULT NOTE  Pharmacy Consult for heparin infusion Indication: pulmonary embolus  Allergies  Allergen Reactions   Bee Venom Anaphylaxis and Swelling   Hornet Venom Shortness Of Breath and Swelling   Influenza Vaccines Other (See Comments)    Seizures    Peanuts [Peanut Oil] Anaphylaxis   Phenytoin Hives and Rash   Valproic Acid Hives and Rash   Lamotrigine Rash    Patient Measurements: Height: 5\' 10"  (177.8 cm) Weight: (!) 137 kg (302 lb 0.5 oz) IBW/kg (Calculated) : 73 Heparin Dosing Weight: 104.7 kg  Vital Signs: Temp: 102.6 F (39.2 C) (03/20 0615) Temp Source: Axillary (03/20 0400) BP: 124/58 (03/20 0615) Pulse Rate: 88 (03/20 0615)  Labs: Recent Labs    05/08/23 1828 05/08/23 1950 05/09/23 0632  HGB 16.1  --  15.1  HCT 50.6  --  47.9  PLT 128*  --  122*  APTT 37*  --  99*  LABPROT 15.4*  --   --   INR 1.2  --   --   HEPARINUNFRC  --   --  0.47  CREATININE 2.28*  --   --   TROPONINIHS 129* 118*  --     Estimated Creatinine Clearance: 48.7 mL/min (A) (by C-G formula based on SCr of 2.28 mg/dL (H)).   Medical History: Past Medical History:  Diagnosis Date   CAD (coronary artery disease)    a. 04/2015 Cath: LM nl, LAD 10ost/p, RI nl, LCX 10p, OM1/2/3 nl, RCA nl. Nl LV fxn.   Chronic heart failure with preserved ejection fraction (HFpEF) (HCC)    a 03/2021 Echo: EF 60-65%, no rwma, mild LVH, nl RV fxn, mild MR, Ao sclerosis.   Depression    Hyperlipidemia    LVH (left ventricular hypertrophy)    Seizure (HCC)    Sleep apnea    SSS (sick sinus syndrome) (HCC)    a. 04/2015 s/p BSX DC PPM (ser #[id number]).   Vitamin D deficiency     Medications:  PTA Meds: Eliquis, last dose unknown  Assessment: Pt is a 58 yo male with hx of PE on Eliquis presenting to ED found unresponsive, tachypneic, hypoxic at home during wellness check.  Goal of Therapy:  Heparin level 0.3-0.7 units/ml aPTT 66-102 seconds Monitor platelets by anticoagulation  protocol: Yes  03/20 0632 aPTT 99, therapeutic x 1 / HL 0.47, correlating x 1    Plan:  Continue heparin infusion at 1750 units/hr Will follow aPTT until correlation w/ HL confirmed Will check aPTT & HL in 6 hr to confirm, then will follow HL if correlation if confirmed CBC daily while on heparin  Otelia Sergeant, PharmD, MBA 05/09/2023 7:00 AM

## 2023-05-09 NOTE — Progress Notes (Signed)
 NAME:  Seth Tucker, MRN:  [id number], DOB:  [birth year], LOS: 1 ADMISSION DATE:  05/08/2023, CONSULTATION DATE:  05/08/23 REFERRING MD:  Dr. Jodie Echevaria, CHIEF COMPLAINT:  Unresponsive & SOB   History of Present Illness:  58 yo M presenting to [facility] ED from home via EMS for evaluation after being found unresponsive, hypoxic with a pulse.   History provided per chart review and sister telephone conference, as patient is unable to participate in interview at this time. Family reported the patient to be in his normal state of health last spoken to 2 days ago on 05/06/23 when he was headed to the [facility] for an annual check up. Sister reports he had no complaints. When asked about any issues with seizure activity, she said family was concerned that maybe he was continuing to have seizures. She stated the patient would describe falling asleep in his car and waking up 3 hours later with no memory of what occurred and that his face/head would be sore as if he had hit the steering wheel somehow. She believes he has only been taking Phenobarbital. She reports he is a current everyday smoker at about a pack a day, but is trying to quit with a 40 + pack year history. She denies ETOH or recreational drug use.  05/09/23-patietn + for adenovirus on RVP.  Remains hypoxemic and acidemic on repeat blood gas eval.  He's on empiric abx and steroids.   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  Interim History / Subjective:  Patient sedated after intubation, RASS: -5 on mechanical ventilatory support.    Objective   Blood pressure (!) 140/65, pulse 89, temperature (!) 101.3 F (38.5 C), resp. rate (!) 27, height 5\' 10"  (1.778 m), weight (!) 137 kg, SpO2 94%.    Vent Mode: PRVC FiO2 (%):  [50 %-70 %] 50 % Set Rate:  [20 bmp-26 bmp] 26 bmp Vt Set:  [450 mL-550 mL] 550 mL PEEP:  [5 cmH20-10 cmH20] 10 cmH20 Pressure Support:  [14 cmH20] 14 cmH20 Plateau Pressure:  [24 cmH20-26 cmH20] 26 cmH20   Intake/Output Summary (Last 24 hours) at 05/09/2023 0835 Last data filed at 05/09/2023 0535 Gross per 24 hour  Intake 2108.11 ml  Output 350 ml  Net 1758.11 ml   Filed Weights   05/08/23 1752 05/09/23 0428  Weight: 136.1 kg (!) 137 kg    Examination: General: Adult male, critically ill, lying in bed intubated & sedated requiring mechanical ventilation, NAD HEENT: MM pink/moist, red schlera, atraumatic, neck supple Neuro: Sedated, RASS: -5, unable to follow commands, ipsilateral pupils: L #1-2(challenging but possibly non reactive) R #5 (sluggish) CV: s1s2 RRR, NSR on monitor, no r/m/g Pulm: Regular, non labored on PRVC 70% & PEEP 5, breath sounds coarse crackles-BUL & diminished-BLL GI: soft, rounded, bs x 4 GU: foley in place with clear yellow urine Skin:  no rashes/lesions noted Extremities: warm/dry, pulses + 2 R/P, +1 edema noted BLE  Resolved Hospital Problem list     Assessment & Plan:  Acute Hypoxic / Hypercapnic Respiratory Failure secondary to multifocal pneumonia & AECOPD in the setting of acute encephalopathy and suspected aspiration PMHx: OSA  on CPAP - Ventilator settings: PRVC  8 mL/kg, 70% FiO2, 5 PEEP, continue ventilator support & lung protective strategies - Wean PEEP & FiO2 as tolerated, maintain SpO2 > 90% - Head of bed elevated 30 degrees, VAP protocol in place - Plateau pressures less than 30 cm H20  - Intermittent chest x-ray & ABG PRN - Daily WUA with SBT as tolerated  - Ensure adequate pulmonary hygiene  - F/u cultures, trend PCT - Continue CAP/Aspiration  Pna coverage: unasyn, azithromycin & vancomycin - Steroids initiated: solu-medrol 40 mg BID  - Pulmicort BID, Duo neb Q 6, bronchodilators PRN - PAD protocol in place: continue Fentanyl IVP & Propofol drip  Leukocytosis  SIRS with suspected Sepsis due to multifocal pneumonia  Query aspiration? Initial interventions/workup included: 500 L of NS/LR & Ceftriaxone/ Azithromycin/ Doxycycline - Supplemental oxygen as needed, to maintain SpO2 > 90% - f/u cultures, trend lactic/ PCT - Daily CBC, monitor WBC/ fever curve - IV antibiotics: azithromycin, unasyn & vancomycin  - IVF hydration as needed, 2nd 500 mL bolus ordered, follow lactic trend for additional IVF  Acute Kidney Injury  Baseline Cr: 0.79, Cr on admission: 2.28 - Strict I/O's: alert provider if UOP < 0.5 mL/kg/hr - gentle IVF hydration  - Daily BMP, replace electrolytes PRN - Avoid nephrotoxic agents as able, ensure adequate renal perfusion - consider renal US PRN  Query Syncope? Chronic HFpEF without exacerbation Elevated Troponin but flat suspect secondary to demand ischemia HTN PMHx: CAD, SSS s/p PPM, HLD - Echocardiogram ordered - heparin drip per pharmacy consult for Hx of PE - hold outpatient regimen: apixaban, losartan, carvedilol- consider restarting as patient stabilizes - labetalol IV PRN for SBP > 165 - continuous cardiac monitoring - Consider Cardiology consultation depending on echo results  Acute Encephalopathy due to unknown etiology Chronic Seizure Disorder Concern for seizure activity leading to unresponsive episode. However lactic so far WNL.  - continue outpatient phenobarbital, monitor LFT's closely - phenobarbital level pending - avoid sedating medications as tolerated - consult neurology depending on work up results - EEG ordered in the AM  Chronic Anticoagulation due to history of PE -start heparin drip - f/u US BLE to r/o DVT, unable to scan for PE due to AKI  Transaminitis  - Trend  hepatic function - Consider RUQ [place] PRN - avoid hepatotoxic agents  Best Practice (right click and "Reselect all SmartList Selections" daily)  Diet/type: NPO w/ meds via tube DVT prophylaxis systemic heparin Pressure ulcer(s): N/A GI prophylaxis: H2B Lines: N/A Foley:  Yes, and it is still needed Code Status:  full code Last date of multidisciplinary goals of care discussion [05/08/23] Sister updated via telephone conference, plan of care discussed. All questions and concerns answered at this time. Labs   CBC: Recent Labs  Lab 05/08/23 1828 05/09/23 0632  WBC 14.6* 13.9*  NEUTROABS 11.5*  --   HGB 16.1 15.1  HCT 50.6 47.9  MCV 90.2 90.4  PLT 128* 122*    Basic Metabolic Panel: Recent Labs  Lab 05/08/23 1828 05/09/23 0632  NA 136 135  K 5.1 5.9*  CL 104 105  CO2 23 21*  GLUCOSE 135* 155*  BUN 56* 64*  CREATININE 2.28* 2.84*  CALCIUM 7.8* 7.2*  MG  --  2.7*  PHOS  --  5.1*   GFR: Estimated Creatinine Clearance: 39.1 mL/min (A) (by C-G formula based on SCr of 2.84 mg/dL (H)). Recent Labs  Lab 05/08/23 1828 05/08/23 1950 05/08/23 2138 05/09/23 4098  PROCALCITON  --   --  4.09 6.68  WBC 14.6*  --   --  13.9*  LATICACIDVEN 1.6 1.9  --   --     Liver Function Tests: Recent Labs  Lab 05/08/23 1828 05/09/23 0632  AST 97* 87*  ALT 55* 47*  ALKPHOS 62 58  BILITOT 0.8 1.5*  PROT 8.0 6.7  ALBUMIN 3.5 2.9*   No results for input(s): "LIPASE", "AMYLASE" in the last 168 hours. Recent Labs  Lab 05/09/23 0632  AMMONIA 92*    ABG    Component Value Date/Time   HCO3 24.2 05/09/2023 0634   ACIDBASEDEF 5.8 (H) 05/09/2023 0634   O2SAT 96.4 05/09/2023 0634     Coagulation Profile: Recent Labs  Lab 05/08/23 1828  INR 1.2    Cardiac Enzymes: No results for input(s): "CKTOTAL", "CKMB", "CKMBINDEX", "TROPONINI" in the last 168 hours.  HbA1C: No results found for: "HGBA1C"  CBG: Recent Labs  Lab 05/08/23 2123 05/08/23 2324 05/09/23 0423  05/09/23 0736  GLUCAP 146* 141* 161* 140*    Review of Systems:   UTA- patient intubated and unable to participate in interview at this time.  Past Medical History:  He,  has a past medical history of CAD (coronary artery disease), Chronic heart failure with preserved ejection fraction (HFpEF) (HCC), Depression, Hyperlipidemia, LVH (left ventricular hypertrophy), Seizure (HCC), Sleep apnea, SSS (sick sinus syndrome) (HCC), and Vitamin D deficiency.   Surgical History:   Past Surgical History:  Procedure Laterality Date   CARDIAC CATHETERIZATION N/A 05/04/2015   Procedure: Left Heart Cath and Coronary Angiography;  Surgeon: Iran Ouch, MD;  Location: [facility] INVASIVE CV LAB;  Service: Cardiovascular;  Laterality: N/A;   EP IMPLANTABLE DEVICE N/A 05/04/2015   Procedure: Pacemaker Implant;  Surgeon: Marinus Maw, MD;  Location: [facility] INVASIVE CV LAB;  Service: Cardiovascular;  Laterality: N/A;   PPM GENERATOR CHANGEOUT N/A 01/23/2023   Procedure: PPM GENERATOR CHANGEOUT;  Surgeon: Marinus Maw, MD;  Location: [facility] INVASIVE CV LAB;  Service: Cardiovascular;  Laterality: N/A;     Social History:   reports that he has been smoking cigarettes. He has a 10 pack-year smoking history. He has never used smokeless tobacco. He reports that he does not drink alcohol and does not use drugs.   Family History:  His family history includes Diabetes in an other family member; Emphysema in his mother; Heart disease in an other family member; Hypertension in his brother and mother.   Allergies Allergies  Allergen Reactions   Bee Venom Anaphylaxis and Swelling   Hornet Venom Shortness Of Breath and Swelling   Influenza Vaccines Other (See Comments)    Seizures    Peanuts [Peanut Oil] Anaphylaxis   Phenytoin Hives and Rash   Valproic Acid Hives and Rash   Lamotrigine Rash     Home Medications  Prior to Admission medications   Medication Sig Start Date End Date Taking? Authorizing Provider   albuterol (PROVENTIL HFA;VENTOLIN HFA) 108 (90 Base) MCG/ACT inhaler Inhale 2 puffs into the lungs every 4 (four) hours as needed for wheezing or shortness of breath.    [provider]  amoxicillin-clavulanate (AUGMENTIN) 875-125 MG tablet Take 1 tablet by mouth 2 (two) times daily. 03/27/22   [provider]  apixaban (ELIQUIS) 5 MG TABS tablet Take 1 tablet (5 mg total) by mouth 2 (two) times daily. 07/03/22   Marinus Maw, MD  aspirin EC 81 MG EC tablet Take 1 tablet (81 mg total) by mouth daily. 05/05/15   Francis Dowse  Larita Fife, PA-C  atorvastatin (LIPITOR) 40 MG tablet Take 40 mg by mouth daily. 02/25/15   [provider]  carvedilol (COREG) 6.25 MG tablet Take 1 tablet (6.25 mg total) by mouth 2 (two) times daily with a meal. 03/29/22   Marrion Coy, MD  cholecalciferol (VITAMIN D) 1000 units tablet Take 1,000 Units by mouth daily.    [provider]  EPINEPHrine 0.3 mg/0.3 mL IJ SOAJ injection Inject 0.3 mg into the muscle as needed for anaphylaxis. Follow package instructions as needed for severe allergy or anaphylactic reaction. 10/24/22   Sharman Cheek, MD  Fluticasone-Salmeterol (ADVAIR) 250-50 MCG/DOSE AEPB Inhale 1 puff into the lungs 2 (two) times daily.    [provider]  furosemide (LASIX) 40 MG tablet Take 1.5 tablets (60 mg total) by mouth daily. 07/03/22   Marinus Maw, MD  HYDROcodone-acetaminophen (NORCO/VICODIN) 5-325 MG tablet Take 1 tablet by mouth 3 (three) times daily as needed. 01/30/23   [provider]  ketoconazole (NIZORAL) 2 % cream Apply to both feet and between toes once daily for 6 weeks. 07/09/22   Freddie Breech, DPM  losartan (COZAAR) 50 MG tablet Take 1 tablet (50 mg total) by mouth daily. 03/29/22   Marrion Coy, MD  nitroGLYCERIN (NITROSTAT) 0.4 MG SL tablet Place 1 tablet (0.4 mg total) under the tongue every 5 (five) minutes as needed for chest pain. 04/28/15   Almond Lint, MD  oxyCODONE-acetaminophen  (PERCOCET/ROXICET) 5-325 MG tablet Take 1 tablet by mouth every 6 (six) hours as needed for moderate pain. 03/29/22   Marrion Coy, MD  PHENObarbital (LUMINAL) 100 MG tablet Take 200 mg by mouth at bedtime.     [provider]     Critical care provider statement:   Total critical care time: 33 minutes   Performed by: Karna Christmas MD   Critical care time was exclusive of separately billable procedures and treating other patients.   Critical care was necessary to treat or prevent imminent or life-threatening deterioration.   Critical care was time spent personally by me on the following activities: development of treatment plan with patient and/or surrogate as well as nursing, discussions with consultants, evaluation of patient's response to treatment, examination of patient, obtaining history from patient or surrogate, ordering and performing treatments and interventions, ordering and review of laboratory studies, ordering and review of radiographic studies, pulse oximetry and re-evaluation of patient's condition.    Vida Rigger, M.D.  Pulmonary & Critical Care Medicine

## 2023-05-09 NOTE — Progress Notes (Signed)
 ANTICOAGULATION CONSULT NOTE  Pharmacy Consult for heparin infusion Indication: pulmonary embolus  Allergies  Allergen Reactions   Bee Venom Anaphylaxis and Swelling   Hornet Venom Shortness Of Breath and Swelling   Influenza Vaccines Other (See Comments)    Seizures    Peanuts [Peanut Oil] Anaphylaxis   Phenytoin Hives and Rash   Valproic Acid Hives and Rash   Lamotrigine Rash    Patient Measurements: Height: 5\' 10"  (177.8 cm) Weight: (!) 137 kg (302 lb 0.5 oz) IBW/kg (Calculated) : 73 Heparin Dosing Weight: 104.7 kg  Vital Signs: Temp: 98.4 F (36.9 C) (03/20 1630) BP: 136/61 (03/20 1630) Pulse Rate: 88 (03/20 1630)  Labs: Recent Labs    05/08/23 1828 05/08/23 1950 05/09/23 0632 05/09/23 1322  HGB 16.1  --  15.1  --   HCT 50.6  --  47.9  --   PLT 128*  --  122*  --   APTT 37*  --  99*  --   LABPROT 15.4*  --   --   --   INR 1.2  --   --   --   HEPARINUNFRC  --   --  0.47 0.89*  CREATININE 2.28*  --  2.84* 3.41*  TROPONINIHS 129* 118*  --   --     Estimated Creatinine Clearance: 32.5 mL/min (A) (by C-G formula based on SCr of 3.41 mg/dL (H)).   Medical History: Past Medical History:  Diagnosis Date   CAD (coronary artery disease)    a. 04/2015 Cath: LM nl, LAD 10ost/p, RI nl, LCX 10p, OM1/2/3 nl, RCA nl. Nl LV fxn.   Chronic heart failure with preserved ejection fraction (HFpEF) (HCC)    a 03/2021 Echo: EF 60-65%, no rwma, mild LVH, nl RV fxn, mild MR, Ao sclerosis.   Depression    Hyperlipidemia    LVH (left ventricular hypertrophy)    Seizure (HCC)    Sleep apnea    SSS (sick sinus syndrome) (HCC)    a. 04/2015 s/p BSX DC PPM (ser #[id number]).   Vitamin D deficiency     Medications:  PTA Meds: Eliquis, last dose unknown  Assessment: Pt is a 58 yo male with hx of PE on Eliquis presenting to ED found unresponsive, tachypneic, hypoxic at home during wellness check.  Goal of Therapy:  Heparin level 0.3-0.7 units/ml aPTT 66-102 seconds Monitor  platelets by anticoagulation protocol: Yes   Plan: aPTT SUPRAtherapeutic  ---hold heparin one hour ---decrease heparin infusion to 1200 units/hr ---Will recheck heparin level and aPTT in 6 hours after rate change ---CBC daily while on heparin  Lowella Bandy, PharmD Clinical Pharmacist 05/09/2023 5:15 PM

## 2023-05-09 NOTE — Progress Notes (Signed)
 New orders to decrease heparin infusion, but Burnis Medin, [facility] sent a secure chat to hold gtt for 1 hour. Heparin gtt stopped at this time.

## 2023-05-09 NOTE — Progress Notes (Signed)
 Pharmacist was made aware PTT just resulted 192. Heparin was previously decreased due to heparin level being elevated at well 0.89. per pharmacists will review and place orders as needed

## 2023-05-09 NOTE — Progress Notes (Signed)
 Pharmacy made aware of critical aPTT and elevated heparin level. Will place new orders based on labs.

## 2023-05-09 NOTE — Progress Notes (Signed)
 Eeg done

## 2023-05-09 NOTE — Progress Notes (Signed)
 PHARMACY - PHYSICIAN COMMUNICATION CRITICAL VALUE ALERT - BLOOD CULTURE IDENTIFICATION (BCID)  Seth Tucker is an 58 y.o. male who presented to [facility] on 05/08/2023 with a chief complaint of Shortness of breath  Assessment:  blood culture from 05/08/23 with GPC in 1 of 4 bottles, BCID detects MRSE.   Name of physician (or Provider) Contacted: Zada Girt, NP  Current antibiotics: Vancomycin, ampicillin/sulbactam, and azithromycin  Changes to prescribed antibiotics recommended:  Recommendations accepted by provider - stop vancomycin   Results for orders placed or performed during the hospital encounter of 05/08/23  Blood Culture ID Panel (Reflexed) (Collected: 05/08/2023  6:28 PM)  Result Value Ref Range   Enterococcus faecalis NOT DETECTED NOT DETECTED   Enterococcus Faecium NOT DETECTED NOT DETECTED   Listeria monocytogenes NOT DETECTED NOT DETECTED   Staphylococcus species DETECTED (A) NOT DETECTED   Staphylococcus aureus (BCID) NOT DETECTED NOT DETECTED   Staphylococcus epidermidis DETECTED (A) NOT DETECTED   Staphylococcus lugdunensis NOT DETECTED NOT DETECTED   Streptococcus species NOT DETECTED NOT DETECTED   Streptococcus agalactiae NOT DETECTED NOT DETECTED   Streptococcus pneumoniae NOT DETECTED NOT DETECTED   Streptococcus pyogenes NOT DETECTED NOT DETECTED   A.calcoaceticus-baumannii NOT DETECTED NOT DETECTED   Bacteroides fragilis NOT DETECTED NOT DETECTED   Enterobacterales NOT DETECTED NOT DETECTED   Enterobacter cloacae complex NOT DETECTED NOT DETECTED   Escherichia coli NOT DETECTED NOT DETECTED   Klebsiella aerogenes NOT DETECTED NOT DETECTED   Klebsiella oxytoca NOT DETECTED NOT DETECTED   Klebsiella pneumoniae NOT DETECTED NOT DETECTED   Proteus species NOT DETECTED NOT DETECTED   Salmonella species NOT DETECTED NOT DETECTED   Serratia marcescens NOT DETECTED NOT DETECTED   Haemophilus influenzae NOT DETECTED NOT DETECTED   Neisseria meningitidis NOT  DETECTED NOT DETECTED   Pseudomonas aeruginosa NOT DETECTED NOT DETECTED   Stenotrophomonas maltophilia NOT DETECTED NOT DETECTED   Candida albicans NOT DETECTED NOT DETECTED   Candida auris NOT DETECTED NOT DETECTED   Candida glabrata NOT DETECTED NOT DETECTED   Candida krusei NOT DETECTED NOT DETECTED   Candida parapsilosis NOT DETECTED NOT DETECTED   Candida tropicalis NOT DETECTED NOT DETECTED   Cryptococcus neoformans/gattii NOT DETECTED NOT DETECTED   Methicillin resistance mecA/C DETECTED (A) NOT DETECTED   Juliette Alcide, PharmD, BCPS, BCIDP Work Cell: [phone] 05/09/2023 12:56 PM

## 2023-05-09 NOTE — Procedures (Addendum)
 History: 58 year old male being evaluated for altered mental status, r/o seizures  Sedation: propofol   Patient State: asleep  EEG duration: 37 minutes  Technique: This EEG was acquired with electrodes placed according to the International 10-20 electrode system (including Fp1, Fp2, F3, F4, C3, C4, P3, P4, O1, O2, T3, T4, T5, T6, A1, A2, Fz, Cz, Pz). The following electrodes were missing or displaced: none.   Background: The background consists of intermixed alpha and beta activities.  There are occasional spindles seen at times, there is also generalized irregular low voltage delta intruding into the background.  Photic stimulation: Physiologic driving is not performed  EEG Abnormalities: Sedated EEG  Clinical Interpretation: This normal EEG is consistent with the patient's sedated state. There was no seizure or seizure predisposition recorded on this study. Please note that lack of epileptiform activity on EEG does not preclude the possibility of epilepsy.   Ann Keto, MD [facility]   If 7pm- 7am, please page neurology on call as listed in AMION.

## 2023-05-09 NOTE — Progress Notes (Signed)
 ANTICOAGULATION CONSULT NOTE  Pharmacy Consult for heparin infusion Indication: pulmonary embolus  Allergies  Allergen Reactions   Bee Venom Anaphylaxis and Swelling   Hornet Venom Shortness Of Breath and Swelling   Influenza Vaccines Other (See Comments)    Seizures    Peanuts [Peanut Oil] Anaphylaxis   Phenytoin Hives and Rash   Valproic Acid Hives and Rash   Lamotrigine Rash    Patient Measurements: Height: 5\' 10"  (177.8 cm) Weight: (!) 137 kg (302 lb 0.5 oz) IBW/kg (Calculated) : 73 Heparin Dosing Weight: 104.7 kg  Vital Signs: Temp: 98.4 F (36.9 C) (03/20 1530) Temp Source: Axillary (03/20 0400) BP: 129/64 (03/20 1530) Pulse Rate: 88 (03/20 1530)  Labs: Recent Labs    05/08/23 1828 05/08/23 1950 05/09/23 0632 05/09/23 1322  HGB 16.1  --  15.1  --   HCT 50.6  --  47.9  --   PLT 128*  --  122*  --   APTT 37*  --  99*  --   LABPROT 15.4*  --   --   --   INR 1.2  --   --   --   HEPARINUNFRC  --   --  0.47 0.89*  CREATININE 2.28*  --  2.84* 3.41*  TROPONINIHS 129* 118*  --   --     Estimated Creatinine Clearance: 32.5 mL/min (A) (by C-G formula based on SCr of 3.41 mg/dL (H)).   Medical History: Past Medical History:  Diagnosis Date   CAD (coronary artery disease)    a. 04/2015 Cath: LM nl, LAD 10ost/p, RI nl, LCX 10p, OM1/2/3 nl, RCA nl. Nl LV fxn.   Chronic heart failure with preserved ejection fraction (HFpEF) (HCC)    a 03/2021 Echo: EF 60-65%, no rwma, mild LVH, nl RV fxn, mild MR, Ao sclerosis.   Depression    Hyperlipidemia    LVH (left ventricular hypertrophy)    Seizure (HCC)    Sleep apnea    SSS (sick sinus syndrome) (HCC)    a. 04/2015 s/p BSX DC PPM (ser #[id number]).   Vitamin D deficiency     Medications:  PTA Meds: Eliquis, last dose unknown  Assessment: Pt is a 58 yo male with hx of PE on Eliquis presenting to ED found unresponsive, tachypneic, hypoxic at home during wellness check.  Goal of Therapy:  Heparin level 0.3-0.7  units/ml aPTT 66-102 seconds Monitor platelets by anticoagulation protocol: Yes  03/20 0632 aPTT 99, therapeutic x 1 / HL 0.47, correlating x 1  03/20 1322 HL 0.89, SUPRAtherapeutic    Plan:  Decrease heparin infusion at 1550 units/hr Will recheck heparin level in 6 hours after rate change CBC daily while on heparin  Bettey Costa, PharmD Clinical Pharmacist 05/09/2023 3:50 PM

## 2023-05-09 NOTE — Consult Note (Signed)
 PHARMACY CONSULT NOTE - ELECTROLYTES  Pharmacy Consult for Electrolyte Monitoring and Replacement   Recent Labs: Height: 5\' 10"  (177.8 cm) Weight: (!) 137 kg (302 lb 0.5 oz) IBW/kg (Calculated) : 73 Estimated Creatinine Clearance: 39.1 mL/min (A) (by C-G formula based on SCr of 2.84 mg/dL (H)). Potassium (mmol/L)  Date Value  05/09/2023 5.9 (H)   Magnesium (mg/dL)  Date Value  16/11/9602 2.7 (H)   Calcium (mg/dL)  Date Value  54/10/8117 7.2 (L)   Albumin (g/dL)  Date Value  14/78/2956 2.9 (L)   Phosphorus (mg/dL)  Date Value  21/30/8657 5.1 (H)   Sodium (mmol/L)  Date Value  05/09/2023 135  01/11/2023 144   Corrected Ca: 8.02 mg/dL  Assessment  Seth Tucker is a 58 y.o. male presenting with hypoxia. PMH significant for seizures, HFpEF, SSS s/p PPM, CAD, PE(on apixaban), HLD. Pharmacy has been consulted to monitor and replace electrolytes.  Diet: NPO MIVF: N/A Pertinent medications: N/A  Goal of Therapy: Electrolytes WNL  Plan:  K 5.9: lokelma 10g x 1 Scr continues to worsen. Nephrology likely to be consulted to help assist in electrolyte abnormalities  Check BMP, Mg, Phos with AM labs  Thank you for allowing pharmacy to be a part of this patient's care.  Bettey Costa, PharmD Clinical Pharmacist 05/09/2023 7:25 AM

## 2023-05-09 NOTE — Progress Notes (Signed)
 eLink Physician-Brief Progress Note Patient Name: Seth Tucker DOB: [birth year] MRN: [id number]   Date of Service  05/09/2023  HPI/Events of Note  58 year old male with a history of morbid obesity, heart failure with preserved ejection fraction, coronary artery disease and sick sinus syndrome presented to the emergency after a home safety eval being found unresponsive, hypoxic with a pulse.  He was admitted with acute hypoxic hypercapnic respiratory failure in the setting of multifocal airspace disease and a likely COPD exacerbation with sepsis.  Patient's vital signs have normalized.  He is saturating 95% on the ventilator with 60% FiO2.  Received broad-spectrum antibiotics.  Last gas consistent with hypercapnia.  Deeply sedated with a RASS of -4.  Results consistent with elevated creatinine and BUN, transaminitis.  Barbiturates positive on urine screen.  CT head with no acute intracranial abnormalities.  Endotracheal tube in place, OG tube needs to be advanced.  eICU Interventions  Maintain mechanical ventilation, phenobarbital, for propofol, and intermittent fentanyl as needed to maintain RASS goal.  Check ammonia and ethanol levels.  Scheduled SVNs in place.  Vancomycin, Unasyn, azithromycin with stress dose steroids for severe pneumonia.  Cultures pending.  CT chest with Edac like physiology, consider increasing PEEP-may reduce auto peeping and allow for better ventilation.  GI prophylaxis with famotidine DVT prophylaxis with SCDs, consider initiation of chemoprophylaxis.     Intervention Category Evaluation Type: New Patient Evaluation  Secily Walthour 05/09/2023, 1:54 AM

## 2023-05-09 NOTE — Plan of Care (Signed)
  Problem: Clinical Measurements: Goal: Diagnostic test results will improve Outcome: Progressing Goal: Respiratory complications will improve Outcome: Progressing Goal: Cardiovascular complication will be avoided Outcome: Progressing   Problem: Nutrition: Goal: Adequate nutrition will be maintained Outcome: Progressing   Problem: Elimination: Goal: Will not experience complications related to urinary retention Outcome: Progressing   Problem: Pain Managment: Goal: General experience of comfort will improve and/or be controlled Outcome: Progressing   Problem: Skin Integrity: Goal: Risk for impaired skin integrity will decrease Outcome: Progressing

## 2023-05-09 NOTE — Progress Notes (Signed)
 Initial Nutrition Assessment  DOCUMENTATION CODES:   Morbid obesity  INTERVENTION:   Vital 1.2@65ml /hr- Initiate at 27ml/hr and increase by 33ml/hr q 8 hours until goal rate is reached.   ProSource TF 20- Give 60ml BID via tube, each supplement provides 80kcal and 20g of protein.   Free water flushes 30ml q4 hours to maintain tube patency   Regimen provides 2032kcal/day, 157g/day protein and 1450ml/day of free water.   Pt at high refeed risk; recommend monitor potassium, magnesium and phosphorus labs daily until stable  Daily weights   NUTRITION DIAGNOSIS:   Inadequate oral intake related to inability to eat (pt sedated and ventilated) as evidenced by NPO status.  GOAL:   Provide needs based on ASPEN/SCCM guidelines  MONITOR:   Vent status, Labs, Weight trends, I & O's, TF tolerance, Skin  REASON FOR ASSESSMENT:   Consult Enteral/tube feeding initiation and management  ASSESSMENT:   58 y/o male with h/o COPD, HLD, depression, OSA, CAD, PE, HTN, CHF, seizures, PVD, pacemaker and lymphedema who is admitted with COPD exacerbation, sepsis, AKI, PNA and SIRS.  Pt sedated and ventilated. OGT in place. Will plan to initiate tube feeds today. Pt is at refeed risk. Per chart, pt is down ~5lbs from his UBW.   Medications reviewed and include: colace, pepcid, solu-medrol, miralax, unasyn, azithromycin, heparin, levophed, propofol   Labs reviewed: K 5.9(H), BUN 64(H), creat 2.84(H), P 5.1(H), Mg 2.7(H) Ammonia 92(H) Wbc- 13.9(H) Cbgs- 150, 140, 161 x 24 hrs   Patient is currently intubated on ventilator support MV: 14.1 L/min Temp (24hrs), Avg:100.5 F (38.1 C), Min:99.1 F (37.3 C), Max:102.9 F (39.4 C)  Propofol: 12.25 ml/hr- provides 323kcal/day   MAP >29mmHg   UOP-   NUTRITION - FOCUSED PHYSICAL EXAM:  Flowsheet Row Most Recent Value  Orbital Region No depletion  Upper Arm Region No depletion  Thoracic and Lumbar Region No depletion  Buccal Region  No depletion  Temple Region No depletion  Clavicle Bone Region No depletion  Clavicle and Acromion Bone Region No depletion  Scapular Bone Region No depletion  Dorsal Hand No depletion  Patellar Region No depletion  Anterior Thigh Region No depletion  Posterior Calf Region No depletion  Edema (RD Assessment) Mild  Hair Reviewed  Eyes Reviewed  Mouth Reviewed  Skin Reviewed  Nails Reviewed   Diet Order:   Diet Order             Diet NPO time specified  Diet effective now                  EDUCATION NEEDS:   No education needs have been identified at this time  Skin:  Skin Assessment: Reviewed RN Assessment  Last BM:  PTA  Height:   Ht Readings from Last 1 Encounters:  05/08/23 5\' 10"  (1.778 m)    Weight:   Wt Readings from Last 1 Encounters:  05/09/23 (!) 137 kg    Ideal Body Weight:  75.45 kg  BMI:  Body mass index is 43.34 kg/m.  Estimated Nutritional Needs:   Kcal:  1507-1918kcal/day  Protein:  >150g/day  Fluid:  1.9-2.2L/day  Betsey Holiday MS, RD, LDN If unable to be reached, please send secure chat to "RD inpatient" available from 8:00a-4:00p daily

## 2023-05-10 ENCOUNTER — Inpatient Hospital Stay (HOSPITAL_COMMUNITY)
Admit: 2023-05-10 | Discharge: 2023-05-10 | Disposition: A | Discharge status: Home / Self Care | Payer: MEDICAID | Attending: Pulmonary Disease | Admitting: Pulmonary Disease

## 2023-05-10 ENCOUNTER — Other Ambulatory Visit: Payer: MEDICAID

## 2023-05-10 DIAGNOSIS — R55 Syncope and collapse: Secondary | ICD-10-CM

## 2023-05-10 LAB — HEPARIN LEVEL (UNFRACTIONATED)
Heparin Unfractionated: 0.74 [IU]/mL — ABNORMAL HIGH (ref 0.30–0.70)
Heparin Unfractionated: 0.69 [IU]/mL (ref 0.30–0.70)
Heparin Unfractionated: 0.59 [IU]/mL (ref 0.30–0.70)

## 2023-05-10 LAB — APTT
aPTT: 149 s — ABNORMAL HIGH (ref 24–36)
aPTT: 102 s — ABNORMAL HIGH (ref 24–36)

## 2023-05-10 LAB — BASIC METABOLIC PANEL
Anion gap: 9 (ref 5–15)
BUN: 80 mg/dL — ABNORMAL HIGH (ref 6–20)
CO2: 25 mmol/L (ref 22–32)
Calcium: 7.4 mg/dL — ABNORMAL LOW (ref 8.9–10.3)
Chloride: 107 mmol/L (ref 98–111)
Creatinine, Ser: 3.62 mg/dL — ABNORMAL HIGH (ref 0.61–1.24)
GFR, Estimated: 19 mL/min — ABNORMAL LOW (ref >60)
Glucose, Bld: 208 mg/dL — ABNORMAL HIGH (ref 70–99)
Potassium: 4.4 mmol/L (ref 3.5–5.1)
Sodium: 141 mmol/L (ref 135–145)

## 2023-05-10 LAB — PROCALCITONIN: Procalcitonin: 6.91 ng/mL

## 2023-05-10 LAB — C-REACTIVE PROTEIN: CRP: 15.2 mg/dL — ABNORMAL HIGH (ref <1.0)

## 2023-05-10 LAB — ECHOCARDIOGRAM COMPLETE
AR max vel: 2.7 cm2
AV Area VTI: 3.24 cm2
AV Area mean vel: 2.66 cm2
AV Mean grad: 4 mmHg
AV Peak grad: 9 mmHg
Ao pk vel: 1.5 m/s
Area-P 1/2: 3.91 cm2
Calc EF: 52.4 %
Height: 70 in
MV VTI: 3.65 cm2
S' Lateral: 3.7 cm
Single Plane A2C EF: 51.4 %
Single Plane A4C EF: 53.3 %
Weight: 5026.49 [oz_av]

## 2023-05-10 LAB — GLUCOSE, CAPILLARY
Glucose-Capillary: 198 mg/dL — ABNORMAL HIGH (ref 70–99)
Glucose-Capillary: 189 mg/dL — ABNORMAL HIGH (ref 70–99)
Glucose-Capillary: 153 mg/dL — ABNORMAL HIGH (ref 70–99)
Glucose-Capillary: 132 mg/dL — ABNORMAL HIGH (ref 70–99)
Glucose-Capillary: 141 mg/dL — ABNORMAL HIGH (ref 70–99)
Glucose-Capillary: 94 mg/dL (ref 70–99)

## 2023-05-10 LAB — CBC
HCT: 39.9 % (ref 39.0–52.0)
Hemoglobin: 13 g/dL (ref 13.0–17.0)
MCH: 28.3 pg (ref 26.0–34.0)
MCHC: 32.6 g/dL (ref 30.0–36.0)
MCV: 86.9 fL (ref 80.0–100.0)
Platelets: 114 10*3/uL — ABNORMAL LOW (ref 150–400)
RBC: 4.59 MIL/uL (ref 4.22–5.81)
RDW: 14.7 % (ref 11.5–15.5)
WBC: 14.6 10*3/uL — ABNORMAL HIGH (ref 4.0–10.5)
nRBC: 0.1 % (ref 0.0–0.2)

## 2023-05-10 LAB — MAGNESIUM: Magnesium: 2.9 mg/dL — ABNORMAL HIGH (ref 1.7–2.4)

## 2023-05-10 LAB — LEGIONELLA PNEUMOPHILA SEROGP 1 UR AG: L. pneumophila Serogp 1 Ur Ag: NEGATIVE

## 2023-05-10 LAB — PHOSPHORUS: Phosphorus: 2.8 mg/dL (ref 2.5–4.6)

## 2023-05-10 MED ORDER — QUETIAPINE FUMARATE 25 MG PO TABS
25.0000 mg | ORAL_TABLET | Freq: Every day | ORAL | Status: DC
  Administered 2023-05-11 – 2023-05-19 (×8): 25 mg via ORAL
  Filled 2023-05-10 (×9): qty 1

## 2023-05-10 MED ORDER — DEXMEDETOMIDINE HCL IN NACL 400 MCG/100ML IV SOLN
0.0000 ug/kg/h | INTRAVENOUS | Status: DC
Start: 2023-05-10 — End: 2023-05-10
  Administered 2023-05-10: 0.9 ug/kg/h via INTRAVENOUS
  Administered 2023-05-10: 0.4 ug/kg/h via INTRAVENOUS
  Filled 2023-05-10 (×2): qty 100

## 2023-05-10 MED ORDER — METHYLPREDNISOLONE SODIUM SUCC 40 MG IJ SOLR
40.0000 mg | INTRAMUSCULAR | Status: DC
  Administered 2023-05-12 – 2023-05-14 (×3): 40 mg via INTRAVENOUS
  Filled 2023-05-10 (×3): qty 1

## 2023-05-10 NOTE — Consult Note (Signed)
 PHARMACY CONSULT NOTE - ELECTROLYTES  Pharmacy Consult for Electrolyte Monitoring and Replacement   Recent Labs: Height: 5\' 10"  (177.8 cm) Weight: (!) 142.5 kg (314 lb 2.5 oz) IBW/kg (Calculated) : 73 Estimated Creatinine Clearance: 31.3 mL/min (A) (by C-G formula based on SCr of 3.62 mg/dL (H)). Potassium (mmol/L)  Date Value  05/10/2023 4.4   Magnesium (mg/dL)  Date Value  81/19/1478 2.9 (H)   Calcium (mg/dL)  Date Value  29/56/2130 7.4 (L)   Albumin (g/dL)  Date Value  86/57/8469 2.9 (L)   Phosphorus (mg/dL)  Date Value  62/95/2841 2.8   Sodium (mmol/L)  Date Value  05/10/2023 141  01/11/2023 144   Corrected Ca: 8.28 mg/dL  Assessment  Seth Tucker is a 58 y.o. male presenting with hypoxia. PMH significant for seizures, HFpEF, SSS s/p PPM, CAD, PE(on apixaban), HLD. Pharmacy has been consulted to monitor and replace electrolytes.  Diet: NPO MIVF: N/A Pertinent medications: N/A  Goal of Therapy: Electrolytes WNL  Plan:  No replacement indicated Scr continues to worsen. Nephrology likely to be consulted to help assist in electrolyte abnormalities  Check BMP, Mg, Phos with AM labs  Thank you for allowing pharmacy to be a part of this patient's care.  Bettey Costa, PharmD Clinical Pharmacist 05/10/2023 7:20 AM

## 2023-05-10 NOTE — Plan of Care (Signed)
 Patient extubated. Maintaining oxygen saturation at 97% on 2L.  Patient following commands.  Problem: Activity: Goal: Ability to tolerate increased activity will improve Outcome: Progressing   Problem: Clinical Measurements: Goal: Ability to maintain clinical measurements within normal limits will improve Outcome: Progressing

## 2023-05-10 NOTE — Progress Notes (Signed)
 ANTICOAGULATION CONSULT NOTE  Pharmacy Consult for heparin infusion Indication: pulmonary embolus  Allergies  Allergen Reactions   Bee Venom Anaphylaxis and Swelling   Hornet Venom Shortness Of Breath and Swelling   Influenza Vaccines Other (See Comments)    Seizures    Peanuts [Peanut Oil] Anaphylaxis   Phenytoin Hives and Rash   Valproic Acid Hives and Rash   Lamotrigine Rash    Patient Measurements: Height: 5\' 10"  (177.8 cm) Weight: (!) 142.5 kg (314 lb 2.5 oz) IBW/kg (Calculated) : 73 Heparin Dosing Weight: 104.7 kg  Vital Signs: Temp: 99 F (37.2 C) (03/21 1030) Temp Source: Bladder (03/21 1030) BP: 133/66 (03/21 1000) Pulse Rate: 92 (03/21 1030)  Labs: Recent Labs    05/08/23 1828 05/08/23 1828 05/08/23 1950 05/09/23 0632 05/09/23 1322 05/09/23 2018 05/10/23 0411 05/10/23 1224  HGB 16.1  --   --  15.1  --   --  13.0  --   HCT 50.6  --   --  47.9  --   --  39.9  --   PLT 128*  --   --  122*  --   --  114*  --   APTT 37*  --   --  99* 192* 171* 149* 102*  LABPROT 15.4*  --   --   --   --   --   --   --   INR 1.2  --   --   --   --   --   --   --   HEPARINUNFRC  --    < >  --  0.47 0.89* 0.99* 0.74* 0.69  CREATININE 2.28*  --   --  2.84* 3.41*  --  3.62*  --   TROPONINIHS 129*  --  118*  --   --   --   --   --    < > = values in this interval not displayed.    Estimated Creatinine Clearance: 31.3 mL/min (A) (by C-G formula based on SCr of 3.62 mg/dL (H)).   Medical History: Past Medical History:  Diagnosis Date   CAD (coronary artery disease)    a. 04/2015 Cath: LM nl, LAD 10ost/p, RI nl, LCX 10p, OM1/2/3 nl, RCA nl. Nl LV fxn.   Chronic heart failure with preserved ejection fraction (HFpEF) (HCC)    a 03/2021 Echo: EF 60-65%, no rwma, mild LVH, nl RV fxn, mild MR, Ao sclerosis.   Depression    Hyperlipidemia    LVH (left ventricular hypertrophy)    Seizure (HCC)    Sleep apnea    SSS (sick sinus syndrome) (HCC)    a. 04/2015 s/p BSX DC PPM (ser  #[id number]).   Vitamin D deficiency     Medications:  PTA Meds: Eliquis, last dose unknown  Assessment: Pt is a 58 yo male with hx of PE on Eliquis presenting to ED found unresponsive, tachypneic, hypoxic at home during wellness check.  Goal of Therapy:  Heparin level 0.3-0.7 units/ml Monitor platelets by anticoagulation protocol: Yes   Plan:  therapeutic x 2   --Continue heparin infusion at 1050 units/hr --Will continue heparin level dosing now that both levels are correlating --Check next heparin level in am --CBC daily while on heparin  Lowella Bandy, PharmD Clinical Pharmacist 05/10/2023 2:54 PM

## 2023-05-10 NOTE — TOC Progression Note (Signed)
 Transition of Care [facility]) - Progression Note    Patient Details  Name: Seth Tucker MRN: [id number] Date of Birth: 1966-02-11  [facility]) CM/SW Contact  Garret Reddish, RN Phone Number: 05/10/2023, 12:21 PM  Clinical Narrative:    Chart reviewed.  Noted that patient was admitted with Acute Hypoxic Respiratory Failure secondary to PNA.  Patient is on IV Unasyn, Azithromycin and Vancomycin.  Patient also has Adenovirus.  Patient is currently intubated and on IV Propofol for sedation.    Noted that patient was found down at home.    TOC will continue to follow for discharge planning.          Expected Discharge Plan and Services                                               Social Determinants of Health (SDOH) Interventions SDOH Screenings   Food Insecurity: Patient Unable To Answer (05/09/2023)  Housing: Patient Unable To Answer (05/09/2023)  Transportation Needs: Patient Unable To Answer (05/09/2023)  Utilities: Patient Unable To Answer (05/09/2023)  Tobacco Use: High Risk (05/08/2023)    Readmission Risk Interventions     No data to display

## 2023-05-10 NOTE — Progress Notes (Signed)
 Pt stated that he was not going to wear a CPAP and he had already told us that, "so don't bring it to him". CPAP remains outside of pt room at this time. Will try later if patient calms down. Pt RN aware.

## 2023-05-10 NOTE — Progress Notes (Signed)
 ANTICOAGULATION CONSULT NOTE  Pharmacy Consult for heparin infusion Indication: pulmonary embolus  Allergies  Allergen Reactions   Bee Venom Anaphylaxis and Swelling   Hornet Venom Shortness Of Breath and Swelling   Influenza Vaccines Other (See Comments)    Seizures    Peanuts [Peanut Oil] Anaphylaxis   Phenytoin Hives and Rash   Valproic Acid Hives and Rash   Lamotrigine Rash    Patient Measurements: Height: 5\' 10"  (177.8 cm) Weight: (!) 142.5 kg (314 lb 2.5 oz) IBW/kg (Calculated) : 73 Heparin Dosing Weight: 104.7 kg  Vital Signs: Temp: 99 F (37.2 C) (03/21 1030) Temp Source: Bladder (03/21 1030) BP: 133/66 (03/21 1000) Pulse Rate: 92 (03/21 1030)  Labs: Recent Labs    05/08/23 1828 05/08/23 1828 05/08/23 1950 05/09/23 0632 05/09/23 1322 05/09/23 2018 05/10/23 0411 05/10/23 1224  HGB 16.1  --   --  15.1  --   --  13.0  --   HCT 50.6  --   --  47.9  --   --  39.9  --   PLT 128*  --   --  122*  --   --  114*  --   APTT 37*  --   --  99* 192* 171* 149* 102*  LABPROT 15.4*  --   --   --   --   --   --   --   INR 1.2  --   --   --   --   --   --   --   HEPARINUNFRC  --    < >  --  0.47 0.89* 0.99* 0.74* 0.69  CREATININE 2.28*  --   --  2.84* 3.41*  --  3.62*  --   TROPONINIHS 129*  --  118*  --   --   --   --   --    < > = values in this interval not displayed.    Estimated Creatinine Clearance: 31.3 mL/min (A) (by C-G formula based on SCr of 3.62 mg/dL (H)).   Medical History: Past Medical History:  Diagnosis Date   CAD (coronary artery disease)    a. 04/2015 Cath: LM nl, LAD 10ost/p, RI nl, LCX 10p, OM1/2/3 nl, RCA nl. Nl LV fxn.   Chronic heart failure with preserved ejection fraction (HFpEF) (HCC)    a 03/2021 Echo: EF 60-65%, no rwma, mild LVH, nl RV fxn, mild MR, Ao sclerosis.   Depression    Hyperlipidemia    LVH (left ventricular hypertrophy)    Seizure (HCC)    Sleep apnea    SSS (sick sinus syndrome) (HCC)    a. 04/2015 s/p BSX DC PPM (ser  #[id number]).   Vitamin D deficiency     Medications:  PTA Meds: Eliquis, last dose unknown  Assessment: Pt is a 58 yo male with hx of PE on Eliquis presenting to ED found unresponsive, tachypneic, hypoxic at home during wellness check.  Goal of Therapy:  Heparin level 0.3-0.7 units/ml aPTT 66-102 seconds Monitor platelets by anticoagulation protocol: Yes   Plan:  --3/21@1224 : HL 0.69, aPTT 102, therapeutic x1 and correlating  --Continue heparin infusion at 1050 units/hr --Will transition to heparin level dosing now that both levels are correlating --Check confirmatory heparin level in 6 hours --CBC daily while on heparin  Bettey Costa, PharmD Clinical Pharmacist 05/10/2023 2:26 PM

## 2023-05-10 NOTE — Progress Notes (Signed)
 ANTICOAGULATION CONSULT NOTE  Pharmacy Consult for heparin infusion Indication: pulmonary embolus  Allergies  Allergen Reactions   Bee Venom Anaphylaxis and Swelling   Hornet Venom Shortness Of Breath and Swelling   Influenza Vaccines Other (See Comments)    Seizures    Peanuts [Peanut Oil] Anaphylaxis   Phenytoin Hives and Rash   Valproic Acid Hives and Rash   Lamotrigine Rash    Patient Measurements: Height: 5\' 10"  (177.8 cm) Weight: (!) 137 kg (302 lb 0.5 oz) IBW/kg (Calculated) : 73 Heparin Dosing Weight: 104.7 kg  Vital Signs: Temp: 98.1 F (36.7 C) (03/21 0500) Temp Source: Bladder (03/21 0500) BP: 135/64 (03/21 0500) Pulse Rate: 93 (03/21 0500)  Labs: Recent Labs    05/08/23 1828 05/08/23 1828 05/08/23 1950 05/09/23 0632 05/09/23 1322 05/09/23 2018 05/10/23 0411  HGB 16.1  --   --  15.1  --   --  13.0  HCT 50.6  --   --  47.9  --   --  39.9  PLT 128*  --   --  122*  --   --  114*  APTT 37*  --   --  99* 192* 171* 149*  LABPROT 15.4*  --   --   --   --   --   --   INR 1.2  --   --   --   --   --   --   HEPARINUNFRC  --    < >  --  0.47 0.89* 0.99* 0.74*  CREATININE 2.28*  --   --  2.84* 3.41*  --  3.62*  TROPONINIHS 129*  --  118*  --   --   --   --    < > = values in this interval not displayed.    Estimated Creatinine Clearance: 30.6 mL/min (A) (by C-G formula based on SCr of 3.62 mg/dL (H)).   Medical History: Past Medical History:  Diagnosis Date   CAD (coronary artery disease)    a. 04/2015 Cath: LM nl, LAD 10ost/p, RI nl, LCX 10p, OM1/2/3 nl, RCA nl. Nl LV fxn.   Chronic heart failure with preserved ejection fraction (HFpEF) (HCC)    a 03/2021 Echo: EF 60-65%, no rwma, mild LVH, nl RV fxn, mild MR, Ao sclerosis.   Depression    Hyperlipidemia    LVH (left ventricular hypertrophy)    Seizure (HCC)    Sleep apnea    SSS (sick sinus syndrome) (HCC)    a. 04/2015 s/p BSX DC PPM (ser #[id number]).   Vitamin D deficiency     Medications:  PTA  Meds: Eliquis, last dose unknown  Assessment: Pt is a 58 yo male with hx of PE on Eliquis presenting to ED found unresponsive, tachypneic, hypoxic at home during wellness check.  Goal of Therapy:  Heparin level 0.3-0.7 units/ml aPTT 66-102 seconds Monitor platelets by anticoagulation protocol: Yes   Plan: HL 0.74 / aPTT 149, levels are supratherapeutic ---decrease heparin infusion to 1050 units/hr ---Will recheck heparin level and aPTT in 6 hours after rate change ---CBC daily while on heparin  Otelia Sergeant, PharmD, [facility] 05/10/2023 5:50 AM

## 2023-05-10 NOTE — Progress Notes (Signed)
 Patient has pulled off tele monitor, nasal cannula, and gown. He is currently attempting to get to the side of the bed to urinate. Explained that he still had a catheter in place and could just go. He wanted me to call his sister, so we negotiated him getting back into the bed and going back on the monitor since he was complaining that he couldn't breath. Oxygen was replaced and monitor. Bed alarm reset to a more sensitive level.

## 2023-05-10 NOTE — Progress Notes (Signed)
 NAME:  Seth Tucker, MRN:  [id number], DOB:  May 04, 1965, LOS: 2 ADMISSION DATE:  05/08/2023, CONSULTATION DATE:  05/08/23 REFERRING MD:  Dr. Jodie Echevaria, CHIEF COMPLAINT:  Unresponsive & SOB   History of Present Illness:  58 yo M presenting to [facility] ED from home via EMS for evaluation after being found unresponsive, hypoxic with a pulse.   History provided per chart review and sister telephone conference, as patient is unable to participate in interview at this time. Family reported the patient to be in his normal state of health last spoken to 2 days ago on 05/06/23 when he was headed to the [facility] for an annual check up. Sister reports he had no complaints. When asked about any issues with seizure activity, she said family was concerned that maybe he was continuing to have seizures. She stated the patient would describe falling asleep in his car and waking up 3 hours later with no memory of what occurred and that his face/head would be sore as if he had hit the steering wheel somehow. She believes he has only been taking Phenobarbital. She reports he is a current everyday smoker at about a pack a day, but is trying to quit with a 40 + pack year history. She denies ETOH or recreational drug use.  05/09/23-patietn + for adenovirus on RVP.  Remains hypoxemic and acidemic on repeat blood gas eval.  He's on empiric abx and steroids.  05/10/23- patient weaning from [facility], on propofol weaning protocol for awakening and SBT. Blood culture with contaminant.  S/p extubation today.  Pertinent  Medical History  Seizure disorder HFpEF SSS s/p PPM placement Pulmonary Embolus on Eliquis CAD HLD OSA  Significant Hospital Events: Including procedures, antibiotic start and stop dates in addition to other pertinent events   05/08/23: Admit to ICU due to acute hypoxic/ hypercapnic respiratory failure secondary to right basilar pneumonia and suspected small para-pnemonic effusion requiring emergent intubation and  mechanical ventilatory support.  Interim History / Subjective:  Patient sedated after intubation, RASS: -5 on mechanical ventilatory support.   Objective   Blood pressure 124/66, pulse 85, temperature 98.4 F (36.9 C), resp. rate (!) 6, height 5\' 10"  (1.778 m), weight (!) 142.5 kg, SpO2 98%.    Vent Mode: PRVC FiO2 (%):  [40 %-50 %] 40 % Set Rate:  [26 bmp] 26 bmp Vt Set:  [550 mL] 550 mL PEEP:  [5 cmH20-10 cmH20] 5 cmH20 Plateau Pressure:  [24 cmH20-25 cmH20] 24 cmH20   Intake/Output Summary (Last 24 hours) at 05/10/2023 0829 Last data filed at 05/10/2023 6213 Gross per 24 hour  Intake 2014.26 ml  Output 1388 ml  Net 626.26 ml   Filed Weights   05/08/23 1752 05/09/23 0428 05/10/23 0500  Weight: 136.1 kg (!) 137 kg (!) 142.5 kg    Examination: General: Adult male, critically ill, lying in bed NAD s/p exubation HEENT: MM pink/moist, red schlera, atraumatic, neck supple Neuro: no FND grossly s/p extubation CV: s1s2 RRR, NSR on monitor, no r/m/g Pulm: Regular, non labored mild rhonchi diminished-BLL GI: soft, rounded, bs x 4 GU: foley in place with clear yellow urine Skin:  no rashes/lesions noted Extremities: warm/dry, pulses + 2 R/P, +1 edema noted BLE  IMAGING   COMPARISON:  Chest radiographs earlier today.  Chest CT 03/28/2022   FINDINGS: CT CHEST FINDINGS   Cardiovascular: The heart is upper normal in size. Pacemaker wires in the right atrium and ventricle. Aortic atherosclerosis without aneurysm. Dilated main pulmonary artery at  4 cm. No pericardial effusion.   Mediastinum/Nodes: Enteric tube decompresses the esophagus. Endotracheal tube tip above the carina. Mediastinal adenopathy including a 16 mm anterior paratracheal node, series 2, image 31. Assessment for hilar adenopathy is limited in the absence of IV contrast. No suspicious thyroid nodule.   Lungs/Pleura: Extends multifocal ground-glass and consolidative opacities throughout both lungs. There is  both lung and all lobar involvement. Greatest airspace disease is in the right middle lobe and lingula with air bronchograms. No cavitary components. There is moderate bronchial thickening. Trace right pleural effusion.   Musculoskeletal: There are no acute or suspicious osseous abnormalities. Left-sided pacemaker.   CT ABDOMEN PELVIS FINDINGS   Hepatobiliary: Hepatic steatosis. No evidence of focal hepatic abnormality on this unenhanced exam. Gallbladder physiologically distended, no calcified stone. No biliary dilatation.   Pancreas: No ductal dilatation or inflammation.   Spleen: Normal in size without focal abnormality.   Adrenals/Urinary Tract: Normal adrenal glands. 4 mm nonobstructing stone in the right kidney. No hydronephrosis or renal inflammation. Decompressed ureters. Urinary bladder is decompressed by Foley catheter.   Stomach/Bowel: Enteric tube tip in the stomach. There is no bowel obstruction or inflammation. Normal appendix.   Vascular/Lymphatic: Aortic atherosclerosis. No aortic aneurysm. Shotty periportal and retroperitoneal nodes, likely reactive.   Reproductive: Prostate is unremarkable.   Other: No ascites or free air. Small fat containing umbilical hernia.   Musculoskeletal: There are no acute or suspicious osseous abnormalities. Degenerative change of both hips in the pubic symphysis. Transitional lumbosacral anatomy with sacralization of L5.   IMPRESSION: 1. Extensive multifocal ground-glass and consolidative opacities throughout both lungs, greatest in the right middle lobe and lingula. Findings are consistent with multifocal pneumonia. 2. Trace right pleural effusion. 3. Mediastinal adenopathy, likely reactive. 4. No acute abnormality in the abdomen/pelvis. 5. Hepatic steatosis. 6. Nonobstructing right renal stone.   Aortic Atherosclerosis (ICD10-I70.0).     Electronically Signed   By: Narda Rutherford M.D.   On: 05/08/2023  21:35  Assessment & Plan:  Acute Hypoxic / Hypercapnic Respiratory Failure secondary to multifocal pneumonia & AECOPD in the setting of acute encephalopathy and suspected aspiration PMHx: OSA on CPAP - F/u cultures, trend PCT-BLOOD CX contaminant staph epi - Continue CAP/Aspiration Pna coverage: unasyn, azithromycin & vancomycin  Acute Kidney Injury -KDGIO 3 - due to septic shock Baseline Cr: 0.79, Cr on admission: 2.28 - Strict I/O's: alert provider if UOP < 0.5 mL/kg/hr - gentle IVF hydration  - Daily BMP, replace electrolytes PRN - Avoid nephrotoxic agents as able, ensure adequate renal perfusion - consider renal US PRN  Septic shock - present on admission - due to pneumonia -now off pressors    Chronic HFpEF without exacerbation Elevated Troponin but flat suspect secondary to demand ischemia HTN PMHx: CAD, SSS s/p PPM, HLD - Echocardiogram ordered - heparin drip per pharmacy consult for Hx of PE - hold outpatient regimen: apixaban, losartan, carvedilol- consider restarting as patient stabilizes - labetalol IV PRN for SBP > 165 - continuous cardiac monitoring - Consider Cardiology consultation depending on echo results  Acute Encephalopathy due to unknown etiology Chronic Seizure Disorder Concern for seizure activity leading to unresponsive episode. However lactic so far WNL.  - continue outpatient phenobarbital, monitor LFT's closely - phenobarbital level pending - avoid sedating medications as tolerated - consult neurology depending on work up results - EEG ordered in the AM  Chronic Anticoagulation due to history of PE -start heparin drip - f/u US BLE to r/o DVT, unable to scan for  PE due to AKI  Transaminitis  - Trend hepatic function - Consider RUQ [place] PRN - avoid hepatotoxic agents  Best Practice (right click and "Reselect all SmartList Selections" daily)  Diet/type: NPO w/ meds via tube DVT prophylaxis systemic heparin Pressure ulcer(s): N/A GI  prophylaxis: H2B Lines: N/A Foley:  Yes, and it is still needed Code Status:  full code Last date of multidisciplinary goals of care discussion [05/08/23] Sister updated via telephone conference, plan of care discussed. All questions and concerns answered at this time. Labs   CBC: Recent Labs  Lab 05/08/23 1828 05/09/23 0632 05/10/23 0411  WBC 14.6* 13.9* 14.6*  NEUTROABS 11.5*  --   --   HGB 16.1 15.1 13.0  HCT 50.6 47.9 39.9  MCV 90.2 90.4 86.9  PLT 128* 122* 114*    Basic Metabolic Panel: Recent Labs  Lab 05/08/23 1828 05/09/23 0632 05/09/23 1322 05/09/23 2018 05/10/23 0411  NA 136 135 136  --  141  K 5.1 5.9* 5.5* 4.8 4.4  CL 104 105 106  --  107  CO2 23 21* 21*  --  25  GLUCOSE 135* 155* 157*  --  208*  BUN 56* 64* 69*  --  80*  CREATININE 2.28* 2.84* 3.41*  --  3.62*  CALCIUM 7.8* 7.2* 7.1*  --  7.4*  MG  --  2.7*  --   --  2.9*  PHOS  --  5.1*  --   --  2.8   GFR: Estimated Creatinine Clearance: 31.3 mL/min (A) (by C-G formula based on SCr of 3.62 mg/dL (H)). Recent Labs  Lab 05/08/23 1828 05/08/23 1950 05/08/23 2138 05/09/23 0632 05/10/23 0411  PROCALCITON  --   --  4.09 6.68 6.91  WBC 14.6*  --   --  13.9* 14.6*  LATICACIDVEN 1.6 1.9  --   --   --     Liver Function Tests: Recent Labs  Lab 05/08/23 1828 05/09/23 0632  AST 97* 87*  ALT 55* 47*  ALKPHOS 62 58  BILITOT 0.8 1.5*  PROT 8.0 6.7  ALBUMIN 3.5 2.9*   No results for input(s): "LIPASE", "AMYLASE" in the last 168 hours. Recent Labs  Lab 05/09/23 0632  AMMONIA 92*    ABG    Component Value Date/Time   HCO3 21.6 05/09/2023 1323   ACIDBASEDEF 5.2 (H) 05/09/2023 1323   O2SAT 99.9 05/09/2023 1323     Coagulation Profile: Recent Labs  Lab 05/08/23 1828  INR 1.2    Cardiac Enzymes: No results for input(s): "CKTOTAL", "CKMB", "CKMBINDEX", "TROPONINI" in the last 168 hours.  HbA1C: Hgb A1c MFr Bld  Date/Time Value Ref Range Status  05/09/2023 08:18 PM 6.6 (H) 4.8 - 5.6  % Final    Comment:    (NOTE) Pre diabetes:          5.7%-6.4%  Diabetes:              >6.4%  Glycemic control for   <7.0% adults with diabetes     CBG: Recent Labs  Lab 05/09/23 1512 05/09/23 1932 05/09/23 2319 05/10/23 0409 05/10/23 0740  GLUCAP 195* 163* 165* 198* 189*    Review of Systems:   UTA- patient intubated and unable to participate in interview at this time.  Past Medical History:  He,  has a past medical history of CAD (coronary artery disease), Chronic heart failure with preserved ejection fraction (HFpEF) (HCC), Depression, Hyperlipidemia, LVH (left ventricular hypertrophy), Seizure (HCC), Sleep apnea, SSS (sick sinus syndrome) (  HCC), and Vitamin D deficiency.   Surgical History:   Past Surgical History:  Procedure Laterality Date   CARDIAC CATHETERIZATION N/A 05/04/2015   Procedure: Left Heart Cath and Coronary Angiography;  Surgeon: Iran Ouch, MD;  Location: [facility] INVASIVE CV LAB;  Service: Cardiovascular;  Laterality: N/A;   EP IMPLANTABLE DEVICE N/A 05/04/2015   Procedure: Pacemaker Implant;  Surgeon: Marinus Maw, MD;  Location: [facility] INVASIVE CV LAB;  Service: Cardiovascular;  Laterality: N/A;   PPM GENERATOR CHANGEOUT N/A 01/23/2023   Procedure: PPM GENERATOR CHANGEOUT;  Surgeon: Marinus Maw, MD;  Location: [facility] INVASIVE CV LAB;  Service: Cardiovascular;  Laterality: N/A;     Social History:   reports that he has been smoking cigarettes. He has a 10 pack-year smoking history. He has never used smokeless tobacco. He reports that he does not drink alcohol and does not use drugs.   Family History:  His family history includes Diabetes in an other family member; Emphysema in his mother; Heart disease in an other family member; Hypertension in his brother and mother.   Allergies Allergies  Allergen Reactions   Bee Venom Anaphylaxis and Swelling   Hornet Venom Shortness Of Breath and Swelling   Influenza Vaccines Other (See Comments)     Seizures    Peanuts [Peanut Oil] Anaphylaxis   Phenytoin Hives and Rash   Valproic Acid Hives and Rash   Lamotrigine Rash     Home Medications  Prior to Admission medications   Medication Sig Start Date End Date Taking? Authorizing Provider  albuterol (PROVENTIL HFA;VENTOLIN HFA) 108 (90 Base) MCG/ACT inhaler Inhale 2 puffs into the lungs every 4 (four) hours as needed for wheezing or shortness of breath.    [provider]  amoxicillin-clavulanate (AUGMENTIN) 875-125 MG tablet Take 1 tablet by mouth 2 (two) times daily. 03/27/22   [provider]  apixaban (ELIQUIS) 5 MG TABS tablet Take 1 tablet (5 mg total) by mouth 2 (two) times daily. 07/03/22   Marinus Maw, MD  aspirin EC 81 MG EC tablet Take 1 tablet (81 mg total) by mouth daily. 05/05/15   Sheilah Pigeon, PA-C  atorvastatin (LIPITOR) 40 MG tablet Take 40 mg by mouth daily. 02/25/15   [provider]  carvedilol (COREG) 6.25 MG tablet Take 1 tablet (6.25 mg total) by mouth 2 (two) times daily with a meal. 03/29/22   Marrion Coy, MD  cholecalciferol (VITAMIN D) 1000 units tablet Take 1,000 Units by mouth daily.    [provider]  EPINEPHrine 0.3 mg/0.3 mL IJ SOAJ injection Inject 0.3 mg into the muscle as needed for anaphylaxis. Follow package instructions as needed for severe allergy or anaphylactic reaction. 10/24/22   Sharman Cheek, MD  Fluticasone-Salmeterol (ADVAIR) 250-50 MCG/DOSE AEPB Inhale 1 puff into the lungs 2 (two) times daily.    [provider]  furosemide (LASIX) 40 MG tablet Take 1.5 tablets (60 mg total) by mouth daily. 07/03/22   Marinus Maw, MD  HYDROcodone-acetaminophen (NORCO/VICODIN) 5-325 MG tablet Take 1 tablet by mouth 3 (three) times daily as needed. 01/30/23   [provider]  ketoconazole (NIZORAL) 2 % cream Apply to both feet and between toes once daily for 6 weeks. 07/09/22   Freddie Breech, DPM  losartan (COZAAR) 50 MG tablet Take 1 tablet (50  mg total) by mouth daily. 03/29/22   Marrion Coy, MD  nitroGLYCERIN (NITROSTAT) 0.4 MG SL tablet Place 1 tablet (0.4 mg total) under the tongue every  5 (five) minutes as needed for chest pain. 04/28/15   Almond Lint, MD  oxyCODONE-acetaminophen (PERCOCET/ROXICET) 5-325 MG tablet Take 1 tablet by mouth every 6 (six) hours as needed for moderate pain. 03/29/22   Marrion Coy, MD  PHENObarbital (LUMINAL) 100 MG tablet Take 200 mg by mouth at bedtime.     [provider]     Critical care provider statement:   Total critical care time: 33 minutes   Performed by: Karna Christmas MD   Critical care time was exclusive of separately billable procedures and treating other patients.   Critical care was necessary to treat or prevent imminent or life-threatening deterioration.   Critical care was time spent personally by me on the following activities: development of treatment plan with patient and/or surrogate as well as nursing, discussions with consultants, evaluation of patient's response to treatment, examination of patient, obtaining history from patient or surrogate, ordering and performing treatments and interventions, ordering and review of laboratory studies, ordering and review of radiographic studies, pulse oximetry and re-evaluation of patient's condition.    Vida Rigger, M.D.  Pulmonary & Critical Care Medicine

## 2023-05-10 NOTE — Progress Notes (Signed)
 Called sister and left VM to bring glasses along with other belongings in the morning.  Made him aware, but he wanted to call her back. Called sister's number from the room phone, he was able to talk to her.

## 2023-05-10 NOTE — Progress Notes (Signed)
*  PRELIMINARY RESULTS* Echocardiogram 2D Echocardiogram has been performed.  Carolyne Fiscal 05/10/2023, 12:16 PM

## 2023-05-10 NOTE — Progress Notes (Signed)
 Patient extubated to 5L [place] with no complications. Saturations are 93%

## 2023-05-11 LAB — GLUCOSE, CAPILLARY
Glucose-Capillary: 99 mg/dL (ref 70–99)
Glucose-Capillary: 115 mg/dL — ABNORMAL HIGH (ref 70–99)
Glucose-Capillary: 105 mg/dL — ABNORMAL HIGH (ref 70–99)
Glucose-Capillary: 130 mg/dL — ABNORMAL HIGH (ref 70–99)
Glucose-Capillary: 100 mg/dL — ABNORMAL HIGH (ref 70–99)

## 2023-05-11 LAB — CBC WITH DIFFERENTIAL/PLATELET
Abs Immature Granulocytes: 0.87 10*3/uL — ABNORMAL HIGH (ref 0.00–0.07)
Basophils Absolute: 0.1 10*3/uL (ref 0.0–0.1)
Basophils Relative: 1 %
Eosinophils Absolute: 0 10*3/uL (ref 0.0–0.5)
Eosinophils Relative: 0 %
HCT: 41.6 % (ref 39.0–52.0)
Hemoglobin: 14.1 g/dL (ref 13.0–17.0)
Immature Granulocytes: 5 %
Lymphocytes Relative: 25 %
Lymphs Abs: 4 10*3/uL (ref 0.7–4.0)
MCH: 27.9 pg (ref 26.0–34.0)
MCHC: 33.9 g/dL (ref 30.0–36.0)
MCV: 82.4 fL (ref 80.0–100.0)
Monocytes Absolute: 1 10*3/uL (ref 0.1–1.0)
Monocytes Relative: 6 %
Neutro Abs: 10.2 10*3/uL — ABNORMAL HIGH (ref 1.7–7.7)
Neutrophils Relative %: 63 %
Platelets: 202 10*3/uL (ref 150–400)
RBC: 5.05 MIL/uL (ref 4.22–5.81)
RDW: 14.6 % (ref 11.5–15.5)
Smear Review: NORMAL
WBC: 16.2 10*3/uL — ABNORMAL HIGH (ref 4.0–10.5)
nRBC: 0.3 % — ABNORMAL HIGH (ref 0.0–0.2)

## 2023-05-11 LAB — COMPREHENSIVE METABOLIC PANEL
ALT: 35 U/L (ref 0–44)
AST: 54 U/L — ABNORMAL HIGH (ref 15–41)
Albumin: 2.8 g/dL — ABNORMAL LOW (ref 3.5–5.0)
Alkaline Phosphatase: 56 U/L (ref 38–126)
Anion gap: 8 (ref 5–15)
BUN: 78 mg/dL — ABNORMAL HIGH (ref 6–20)
CO2: 24 mmol/L (ref 22–32)
Calcium: 8 mg/dL — ABNORMAL LOW (ref 8.9–10.3)
Chloride: 106 mmol/L (ref 98–111)
Creatinine, Ser: 3.2 mg/dL — ABNORMAL HIGH (ref 0.61–1.24)
GFR, Estimated: 21 mL/min — ABNORMAL LOW (ref >60)
Glucose, Bld: 113 mg/dL — ABNORMAL HIGH (ref 70–99)
Potassium: 3.6 mmol/L (ref 3.5–5.1)
Sodium: 138 mmol/L (ref 135–145)
Total Bilirubin: 0.9 mg/dL (ref 0.0–1.2)
Total Protein: 7 g/dL (ref 6.5–8.1)

## 2023-05-11 LAB — HEPARIN LEVEL (UNFRACTIONATED)
Heparin Unfractionated: 0.27 [IU]/mL — ABNORMAL LOW (ref 0.30–0.70)
Heparin Unfractionated: 0.2 [IU]/mL — ABNORMAL LOW (ref 0.30–0.70)

## 2023-05-11 LAB — CULTURE, BLOOD (ROUTINE X 2)

## 2023-05-11 LAB — MAGNESIUM: Magnesium: 2.8 mg/dL — ABNORMAL HIGH (ref 1.7–2.4)

## 2023-05-11 LAB — PHOSPHORUS: Phosphorus: 1.5 mg/dL — ABNORMAL LOW (ref 2.5–4.6)

## 2023-05-11 MED ORDER — PHENOBARBITAL 100 MG PO TABS
200.0000 mg | ORAL_TABLET | Freq: Every day | ORAL | Status: DC
  Administered 2023-05-12 – 2023-05-19 (×8): 200 mg via ORAL
  Filled 2023-05-11 (×9): qty 2

## 2023-05-11 MED ORDER — POLYETHYLENE GLYCOL 3350 17 G PO PACK
17.0000 g | PACK | Freq: Every day | ORAL | Status: DC | PRN
Start: 2023-05-11 — End: 2023-05-20

## 2023-05-11 MED ORDER — ENSURE ENLIVE PO LIQD
237.0000 mL | Freq: Two times a day (BID) | ORAL | Status: DC
  Administered 2023-05-11 – 2023-05-13 (×3): 237 mL via ORAL

## 2023-05-11 MED ORDER — POLYETHYLENE GLYCOL 3350 17 G PO PACK
17.0000 g | PACK | Freq: Every day | ORAL | Status: DC
  Administered 2023-05-12 – 2023-05-20 (×7): 17 g via ORAL
  Filled 2023-05-11 (×9): qty 1

## 2023-05-11 MED ORDER — DOCUSATE SODIUM 50 MG/5ML PO LIQD: 100.0000 mg | Freq: Two times a day (BID) | ORAL | Status: DC | PRN

## 2023-05-11 MED ORDER — FAMOTIDINE 20 MG PO TABS
20.0000 mg | ORAL_TABLET | Freq: Two times a day (BID) | ORAL | Status: DC
  Administered 2023-05-11 – 2023-05-20 (×18): 20 mg via ORAL
  Filled 2023-05-11 (×18): qty 1

## 2023-05-11 MED ORDER — K PHOS MONO-SOD PHOS DI & MONO 155-852-130 MG PO TABS
500.0000 mg | ORAL_TABLET | Freq: Once | ORAL | Status: DC
  Filled 2023-05-11: qty 2

## 2023-05-11 MED ORDER — DOCUSATE SODIUM 50 MG/5ML PO LIQD
100.0000 mg | Freq: Two times a day (BID) | ORAL | Status: DC
  Administered 2023-05-12 – 2023-05-19 (×6): 100 mg via ORAL
  Filled 2023-05-11 (×19): qty 10

## 2023-05-11 MED ORDER — HEPARIN BOLUS VIA INFUSION
1550.0000 [IU] | Freq: Once | INTRAVENOUS | Status: AC
Start: 2023-05-11 — End: 2023-05-11
  Administered 2023-05-11: 1550 [IU] via INTRAVENOUS
  Filled 2023-05-11: qty 1550

## 2023-05-11 MED ORDER — LACTULOSE 10 GM/15ML PO SOLN: 30.0000 g | Freq: Three times a day (TID) | ORAL | Status: DC

## 2023-05-11 MED ORDER — ACETAMINOPHEN 325 MG PO TABS: 650.0000 mg | ORAL_TABLET | ORAL | Status: DC | PRN

## 2023-05-11 MED ORDER — K PHOS MONO-SOD PHOS DI & MONO 155-852-130 MG PO TABS
500.0000 mg | ORAL_TABLET | Freq: Once | ORAL | Status: AC
  Administered 2023-05-11: 500 mg via ORAL
  Filled 2023-05-11: qty 2

## 2023-05-11 MED ORDER — LACTULOSE 10 GM/15ML PO SOLN
30.0000 g | Freq: Three times a day (TID) | ORAL | Status: DC
  Administered 2023-05-11 – 2023-05-20 (×10): 30 g via ORAL
  Filled 2023-05-11 (×19): qty 60

## 2023-05-11 NOTE — Progress Notes (Signed)
 Tried to get patient to take Seroquel, he refused and states "I only take medications that my doctor orders." When I explained that it was ordered by his doctor, he stated "that's not my doctor."

## 2023-05-11 NOTE — TOC Progression Note (Signed)
 Transition of Care [facility]) - Progression Note    Patient Details  Name: Seth Tucker MRN: [id number] Date of Birth: Apr 04, 1965  Transition of [facility]) CM/SW Contact  Liliana Cline, LCSW Phone Number: 05/11/2023, 9:16 AM  Clinical Narrative:    Reached out to MD and RN regarding consult about living conditions. Per MD, no concerns or TOC needs at this time. Please re consult TOC with specific needs/concerns if needed.        Expected Discharge Plan and Services                                               Social Determinants of Health (SDOH) Interventions SDOH Screenings   Food Insecurity: Patient Unable To Answer (05/09/2023)  Housing: Patient Unable To Answer (05/09/2023)  Transportation Needs: Patient Unable To Answer (05/09/2023)  Utilities: Patient Unable To Answer (05/09/2023)  Tobacco Use: High Risk (05/08/2023)    Readmission Risk Interventions     No data to display

## 2023-05-11 NOTE — Progress Notes (Signed)
 ANTICOAGULATION CONSULT NOTE  Pharmacy Consult for heparin infusion Indication: pulmonary embolus  Allergies  Allergen Reactions   Bee Venom Anaphylaxis and Swelling   Hornet Venom Shortness Of Breath and Swelling   Influenza Vaccines Other (See Comments)    Seizures    Peanuts [Peanut Oil] Anaphylaxis   Phenytoin Hives and Rash   Valproic Acid Hives and Rash   Lamotrigine Rash    Patient Measurements: Height: 5\' 10"  (177.8 cm) Weight: (!) 138.1 kg (304 lb 7.3 oz) IBW/kg (Calculated) : 73 Heparin Dosing Weight: 104.7 kg  Vital Signs: Temp: 100.4 F (38 C) (03/22 1600) Temp Source: Axillary (03/22 1600) BP: 141/72 (03/22 1800) Pulse Rate: 80 (03/22 1700)  Labs: Recent Labs    05/09/23 0632 05/09/23 1322 05/09/23 2018 05/10/23 0411 05/10/23 1224 05/10/23 1905 05/11/23 1043 05/11/23 2016  HGB 15.1  --   --  13.0  --   --  14.1  --   HCT 47.9  --   --  39.9  --   --  41.6  --   PLT 122*  --   --  114*  --   --  202  --   APTT 99* 192* 171* 149* 102*  --   --   --   HEPARINUNFRC 0.47 0.89* 0.99* 0.74* 0.69 0.59 0.27* 0.20*  CREATININE 2.84* 3.41*  --  3.62*  --   --  3.20*  --     Estimated Creatinine Clearance: 34.8 mL/min (A) (by C-G formula based on SCr of 3.2 mg/dL (H)).   Medical History: Past Medical History:  Diagnosis Date   CAD (coronary artery disease)    a. 04/2015 Cath: LM nl, LAD 10ost/p, RI nl, LCX 10p, OM1/2/3 nl, RCA nl. Nl LV fxn.   Chronic heart failure with preserved ejection fraction (HFpEF) (HCC)    a 03/2021 Echo: EF 60-65%, no rwma, mild LVH, nl RV fxn, mild MR, Ao sclerosis.   Depression    Hyperlipidemia    LVH (left ventricular hypertrophy)    Seizure (HCC)    Sleep apnea    SSS (sick sinus syndrome) (HCC)    a. 04/2015 s/p BSX DC PPM (ser #[id number]).   Vitamin D deficiency     Medications:  PTA Meds: Eliquis, last dose unknown  Assessment: Pt is a 58 yo male with hx of PE on Eliquis presenting to ED found unresponsive,  tachypneic, hypoxic at home during wellness check.  Date Time HL Rate/Comment 3/22 1043 0.27 Subtherapeutic 3/22 2016 0.20 Subtherapeutic  Goal of Therapy:  Heparin level 0.3-0.7 units/ml Monitor platelets by anticoagulation protocol: Yes   Plan:  Heparin level subtherapeutic, no issues with infusion reported Give heparin bolus of 1550 units x1 --increase heparin infusion rate to 1400 units/hr --Check next heparin level in 6 hours after rate change --CBC daily while on heparin  Thank you for involving pharmacy in this patient's care.   Rockwell Alexandria, PharmD Clinical Pharmacist 05/11/2023 8:45 PM

## 2023-05-11 NOTE — Evaluation (Signed)
 Physical Therapy Evaluation Patient Details Name: Seth Tucker MRN: [id number] DOB: 09-Nov-1965 Today's Date: 05/11/2023  History of Present Illness  Pt is a 58 year old male admitted to ICU due to acute hypoxic/ hypercapnic respiratory failure secondary to possible seizure, right basilar pneumonia and suspected small para-pnemonic effusion  after being found unresponsive, requiring emergent intubation and mechanical ventilatory support, extubated 05/10/23;     PMH significant for Seizure disorder, HFpEF  SSS s/p PPM placement, Pulmonary Embolus on Eliquis, CAD, HLD, OSA  Clinical Impression  Patient seated edge of bed with OT upon arrival to room.  Patient alert and oriented to self only; follows commands, pleasant and cooperative.  Generally impulsive and easily distractible by external environment. No clinical indicators of pain.  Bilat UE/LE strength and ROM grossly symmetrical and WFL for basic transfers and mobility; no focal weakness appreciated.  Currently requiring min/mod assist +2 for bed mobility; min/mod assist +2 for sit/stand, standing balance and lateral stepping edge of bed with RW.  Demonstrates broad BOS, excessive sway bilat; decreased foot clearance bilat; consistent cuing for postural extension and pursed lip breathing. Generally weak and unstead, increased sway in all planes. Heavy reliance on RW; notably SOB with minimal activity.  Patient very motivated and eager to progress; anticipate good progression towards goals as medical status continues to improve and therapy continues to progress. Would benefit from skilled PT to address above deficits and promote optimal return to PLOF.; recommend post-acute PT follow up as indicated by interdisciplinary care team.            If plan is discharge home, recommend the following: Two people to help with walking and/or transfers;Two people to help with bathing/dressing/bathroom   Can travel by private vehicle        Equipment  Recommendations    Recommendations for Other Services       Functional Status Assessment Patient has had a recent decline in their functional status and demonstrates the ability to make significant improvements in function in a reasonable and predictable amount of time.     Precautions / Restrictions Precautions Precautions: Fall Recall of Precautions/Restrictions: Impaired Restrictions Weight Bearing Restrictions Per Provider Order: No      Mobility  Bed Mobility Overal bed mobility: Needs Assistance Bed Mobility: Supine to Sit, Sit to Supine     Supine to sit: Mod assist, HOB elevated, Used rails, +2 for safety/equipment Sit to supine: Min assist, HOB elevated, +2 for safety/equipment, Used rails        Transfers Overall transfer level: Needs assistance Equipment used: Rolling walker (2 wheels) Transfers: Sit to/from Stand Sit to Stand: Min assist, +2 physical assistance, +2 safety/equipment           General transfer comment: broad BOS, increased use of momentum for lift off; increased sway all directions    Ambulation/Gait Ambulation/Gait assistance: Min assist, +2 physical assistance Gait Distance (Feet):  (3-5 steps x2) Assistive device: Rolling walker (2 wheels)         General Gait Details: broad BOS, excessive sway bilat; decreased foot clearance bilat; consistent cuing for postural extension and pursed lip breathing. Generally weak and unstead, increased sway in all planes. Heavy reliance on RW; notably SOB with minimal activity.  Stairs            Wheelchair Mobility     Tilt Bed    Modified Rankin (Stroke Patients Only)       Balance Overall balance assessment: Needs assistance Sitting-balance support: No upper  extremity supported, Feet supported Sitting balance-Leahy Scale: Fair     Standing balance support: Bilateral upper extremity supported Standing balance-Leahy Scale: Fair                                Pertinent Vitals/Pain Pain Assessment Pain Assessment: No/denies pain    Home Living Family/patient expects to be discharged to:: Private residence Living Arrangements: Alone   Type of Home: House Home Access: Stairs to enter   Secretary/administrator of Steps: 3   Home Layout: Laundry or work area in basement (flight down to basement) Home Equipment: Agricultural consultant (2 wheels)      Prior Function Prior Level of Function : Driving;Independent/Modified Independent             Mobility Comments: RW 2-3x per week when he is "worn out", other than that amb with no AD per pt report ADLs Comments: MOD I for dressing,feeding, grooming, bathing (pt reports increased difficulties getting over tub/shower recently), reports MOD I-I for IADL- goes out to eat for meals and 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 Highly distractible, generally impulsive Following commands: Impaired Following commands impaired: Follows one step commands with increased time     Cueing Cueing Techniques: Verbal cues, Tactile cues, Visual cues, Gestural cues     General Comments      Exercises     Assessment/Plan    PT Assessment Patient needs continued PT services  PT Problem List Decreased strength;Decreased range of motion;Decreased balance;Decreased activity tolerance;Decreased mobility;Decreased coordination;Decreased  cognition;Decreased knowledge of use of DME;Decreased safety awareness;Decreased knowledge of precautions;Cardiopulmonary status limiting activity;Obesity       PT Treatment Interventions DME instruction;Gait training;Stair training;Functional mobility training;Therapeutic activities;Therapeutic exercise;Balance training;Cognitive remediation;Patient/family education    PT Goals (Current goals can be found in the Care Plan section)  Acute Rehab PT Goals Patient Stated Goal: to go home! PT Goal Formulation: With patient Time For Goal Achievement: 05/25/23 Potential to Achieve Goals: Good    Frequency Min 3X/week     Co-evaluation PT/OT/SLP Co-Evaluation/Treatment: Yes Reason for Co-Treatment: For patient/therapist safety;To address functional/ADL transfers;Complexity of the patient's impairments (multi-system involvement);Necessary to address cognition/behavior during functional activity PT goals addressed during session: Mobility/safety with mobility OT goals addressed during session: ADL's and self-care       AM-PAC PT "6 Clicks" Mobility  Outcome Measure Help needed turning from your back to your side while in a flat bed without using bedrails?: A Lot Help needed moving from lying on your back to sitting on the side of a flat bed without using bedrails?: A Lot Help needed moving to and from a bed to a chair (including a wheelchair)?: A Lot Help needed standing up from a chair using your arms (e.g., wheelchair or bedside chair)?: A Lot Help needed to walk in hospital room?:  A Lot Help needed climbing 3-5 steps with a railing? : A Lot 6 Click Score: 12    End of Session Equipment Utilized During Treatment: Gait belt Activity Tolerance: Patient tolerated treatment well Patient left: in bed;with call bell/phone within reach;with bed alarm set (chair position in bed) Nurse Communication: Mobility status PT Visit Diagnosis: Muscle weakness (generalized) (M62.81);Difficulty in  walking, not elsewhere classified (R26.2)    Time: 1610-9604 PT Time Calculation (min) (ACUTE ONLY): 20 min   Charges:   PT Evaluation $PT Eval Moderate Complexity: 1 Mod   PT General Charges $$ ACUTE PT VISIT: 1 Visit         Aricka Goldberger H. Manson Passey, PT, DPT, NCS 05/11/23, 11:17 AM [phone]

## 2023-05-11 NOTE — Consult Note (Signed)
 PHARMACY CONSULT NOTE - ELECTROLYTES  Pharmacy Consult for Electrolyte Monitoring and Replacement   Recent Labs: Height: 5\' 10"  (177.8 cm) Weight: (!) 138.1 kg (304 lb 7.3 oz) IBW/kg (Calculated) : 73 Estimated Creatinine Clearance: 34.8 mL/min (A) (by C-G formula based on SCr of 3.2 mg/dL (H)). Potassium (mmol/L)  Date Value  05/11/2023 3.6   Magnesium (mg/dL)  Date Value  84/69/6295 2.8 (H)   Calcium (mg/dL)  Date Value  28/41/3244 8.0 (L)   Albumin (g/dL)  Date Value  02/21/7251 2.8 (L)   Phosphorus (mg/dL)  Date Value  66/44/0347 1.5 (L)   Sodium (mmol/L)  Date Value  05/11/2023 138  01/11/2023 144   Corrected Ca: 8.28 mg/dL  Assessment  Seth Tucker is a 58 y.o. male presenting with hypoxia. PMH significant for seizures, HFpEF, SSS s/p PPM, CAD, PE(on apixaban), HLD. Pharmacy has been consulted to monitor and replace electrolytes.  Goal of Therapy: Electrolytes WNL  Plan:  500 mg phosphorous per tube x 1 (contains phosphorus 16 mMol, potassium 2.2 mEq) Check BMP, Mg, Phos with AM labs  Thank you for allowing pharmacy to be a part of this patient's care.  Lowella Bandy, PharmD Clinical Pharmacist 05/11/2023 12:03 PM

## 2023-05-11 NOTE — Evaluation (Addendum)
 Occupational Therapy Evaluation Patient Details Name: Seth Tucker MRN: [id number] DOB: 1965/11/14 Today's Date: 05/11/2023   History of Present Illness   Pt is a 58 year old male admitted to ICU due to acute hypoxic/ hypercapnic respiratory failure secondary to right basilar pneumonia and suspected small para-pnemonic effusion  after being found unresponsive, requiring emergent intubation and mechanical ventilatory support, extubated 05/10/23;     PMH significant for Seizure disorder, HFpEF  SSS s/p PPM placement, Pulmonary Embolus on Eliquis, CAD, HLD, OSA     Clinical Impressions Chart reviewed, to date, pt greeted in bed, slouched towards bottom, recently pulled IV out. Pt is initially oriented to self only, then oriented to self and place after being reoriented. Pt is impulsive throughout, appears to improve with position changes and mobility. 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 Will need to confirm PLOF. Pt presents with deficits in strength, endurance, activity tolerance, balance, cognition, affecting safe and optimal ADL completion. MOD A +2 required for bed mobility, STS with MIN A +2 multiple attempts, steps up the bed to the R with MIN A +2; frequent cueing for technique. MAX A required for LB bathing/dressing, MOD A for UB bathing, MIN A for UB dressing. SET Up for feeding tasks. Pt with SOB during activity, attempting to remove spo2 monitor, >90% on 3.5 L at rest. Pt will benefit from intensive inpatient OT to address deficits and to facilitate optimal ADL performance. Pt is left in chair position, safety maintained, all needs met. OT will follow acutely.      If plan is discharge home, recommend the following:   A lot of help with bathing/dressing/bathroom;A lot of help with walking and/or transfers;Supervision due to cognitive status     Functional Status Assessment   Patient has had a recent decline in  their functional status and demonstrates the ability to make significant improvements in function in a reasonable and predictable amount of time.     Equipment Recommendations   Other (comment) (defer)     Recommendations for Other Services         Precautions/Restrictions   Precautions Precautions: Fall Recall of Precautions/Restrictions: Impaired Restrictions Weight Bearing Restrictions Per Provider Order: No     Mobility Bed Mobility Overal bed mobility: Needs Assistance Bed Mobility: Supine to Sit, Sit to Supine     Supine to sit: Mod assist, HOB elevated, Used rails, +2 for safety/equipment Sit to supine: Min assist, HOB elevated, +2 for safety/equipment, Used rails        Transfers Overall transfer level: Needs assistance Equipment used: Rolling walker (2 wheels) Transfers: Sit to/from Stand Sit to Stand: Min assist, +2 physical assistance, +2 safety/equipment (with RW two attempts)           General transfer comment: two bouts of 2-3 lateral steps up the bed with RW with MIN A +2, frequent vcs for technique      Balance Overall balance assessment: Needs assistance Sitting-balance support: Feet supported Sitting balance-Leahy Scale: Fair     Standing balance support: Bilateral upper extremity supported, Reliant on assistive device for balance, During functional activity Standing balance-Leahy Scale: Fair                             ADL either performed or assessed with clinical judgement   ADL Overall ADL's : Needs assistance/impaired Eating/Feeding: Set up;Sitting;Supervision/ safety  Grooming: Wash/dry face;Sitting;Minimal assistance   Upper Body Bathing: Moderate assistance;Bed level   Lower Body Bathing: Maximal assistance;Bed level   Upper Body Dressing : Minimal assistance;Bed level   Lower Body Dressing: Maximal assistance Lower Body Dressing Details (indicate cue type and reason): donn socks     Toileting-  Clothing Manipulation and Hygiene: Maximal assistance;Bed level Toileting - Clothing Manipulation Details (indicate cue type and reason): peri care, pt has foley currently       General ADL Comments: pt had pulled IV out prior to therapist entering room, assisted with full linen change/bath prior to mobility     Vision Patient Visual Report: No change from baseline Additional Comments: will continue to assess     Perception         Praxis         Pertinent Vitals/Pain Pain Assessment Pain Assessment: No/denies pain     Extremity/Trunk Assessment Upper Extremity Assessment Upper Extremity Assessment: Generalized weakness (BUE AROM appears grossly WFL, 4/5 throughout)   Lower Extremity Assessment Lower Extremity Assessment: Defer to PT evaluation;Generalized weakness       Communication Communication Communication: No apparent difficulties   Cognition Arousal: Alert Behavior During Therapy: Impulsive Cognition: No family/caregiver present to determine baseline, Cognition impaired   Orientation impairments: Time, Situation Awareness: Intellectual awareness impaired, Online awareness impaired Memory impairment (select all impairments): Short-term memory, Working memory Attention impairment (select first level of impairment): Sustained attention Executive functioning impairment (select all impairments): Reasoning, Problem solving OT - Cognition Comments: pt had pulled out IV prior to threapist entering room, improved participation, less pullig at lines after mobility                 Following commands: Impaired Following commands impaired: Follows one step commands with increased time     Cueing  General Comments   Cueing Techniques: Verbal cues;Tactile cues;Visual cues;Gestural cues      Exercises Other Exercises Other Exercises: edu re: role of OT, role of rehab   Shoulder Instructions      Home Living Family/patient expects to be discharged to::  Private residence Living Arrangements: Alone   Type of Home: House Home Access: Stairs to enter Secretary/administrator of Steps: 3   Home Layout: Laundry or work area in basement (flight down to the basement)     Bathroom Shower/Tub: Chief Strategy Officer: Handicapped height     Home Equipment: Agricultural consultant (2 wheels)          Prior Functioning/Environment Prior Level of Function : Driving             Mobility Comments: RW 2-3x per week when he is "worn out", other than that amb with no AD per pt report ADLs Comments: MOD I for dressing,feeding, grooming, bathing (pt reports increased difficulties getting over tub/shower recently), reports MOD I-I for IADL- goes out to eat for meals and 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    OT Problem List: Decreased strength;Decreased range of motion;Decreased activity tolerance;Decreased knowledge of use of DME or AE;Impaired balance (sitting and/or standing);Decreased cognition;Decreased knowledge of precautions;Cardiopulmonary status limiting activity;Decreased safety awareness   OT Treatment/Interventions: Self-care/ADL training;DME and/or AE instruction;Therapeutic activities;Balance training;Therapeutic exercise;Energy conservation;Patient/family education      OT Goals(Current goals can be found in the care plan section)   Acute Rehab OT Goals Patient Stated Goal: go home today OT Goal Formulation: With patient Time For Goal Achievement: 05/25/23 Potential to Achieve Goals: Poor  ADL Goals Pt Will Perform Grooming: with modified independence;sitting Pt Will Perform Lower Body Dressing: with modified independence;sit to/from stand;sitting/lateral leans Pt Will Transfer to Toilet: with modified independence;ambulating Pt Will Perform Toileting - Clothing Manipulation and hygiene: with modified independence;sitting/lateral leans;sit to/from stand   OT Frequency:  Min 3X/week     Co-evaluation PT/OT/SLP Co-Evaluation/Treatment: Yes Reason for Co-Treatment: For patient/therapist safety;To address functional/ADL transfers;Complexity of the patient's impairments (multi-system involvement);Necessary to address cognition/behavior during functional activity   OT goals addressed during session: ADL's and self-care      AM-PAC OT "6 Clicks" Daily Activity     Outcome Measure Help from another person eating meals?: A Little Help from another person taking care of personal grooming?: A Little Help from another person toileting, which includes using toliet, bedpan, or urinal?: A Lot Help from another person bathing (including washing, rinsing, drying)?: A Lot Help from another person to put on and taking off regular upper body clothing?: A Little Help from another person to put on and taking off regular lower body clothing?: A Lot 6 Click Score: 15   End of Session Equipment Utilized During Treatment: Rolling walker (2 wheels);Oxygen Nurse Communication: Mobility status  Activity Tolerance: Patient tolerated treatment well Patient left: in bed;with call bell/phone within reach;with bed alarm set (in chair position)  OT Visit Diagnosis: Other abnormalities of gait and mobility (R26.89);Muscle weakness (generalized) (M62.81);Cognitive communication deficit (R41.841)                Time: 2956-2130 OT Time Calculation (min): 43 min Charges:  OT General Charges $OT Visit: 1 Visit OT Evaluation $OT Eval High Complexity: 1 High Oleta Mouse, OTD OTR/L  05/11/23, 10:48 AM

## 2023-05-11 NOTE — Progress Notes (Signed)
 [facility] CONSULT NOTE    Date: 05/11/2023                  Patient Name:  Seth Tucker  MRN: [id number]  DOB: 10-06-1965  Age / Sex: 58 y.o., male         PCP: [facility]                 Service Requesting Consult: ICU                  Reason for Consult: Acute kidney injury            History of Present Illness: Patient is a 58 y.o. male with a PMHx of obese male with history of seizure disorder, hypertension, congestive heart failure, COPD was admitted with history of unresponsive episode.  He was initially intubated and sedated.  He was found to have pneumonia and was treated with antibiotics.  He is now found to have acute kidney injury.  Medications: Outpatient medications: Medications Prior to Admission  Medication Sig Dispense Refill Last Dose/Taking   albuterol (PROVENTIL HFA;VENTOLIN HFA) 108 (90 Base) MCG/ACT inhaler Inhale 2 puffs into the lungs every 4 (four) hours as needed for wheezing or shortness of breath.   Past Month   HYDROcodone-acetaminophen (NORCO/VICODIN) 5-325 MG tablet Take 1 tablet by mouth 3 (three) times daily as needed.   Past Week   PHENObarbital (LUMINAL) 100 MG tablet Take 200 mg by mouth at bedtime.    Past Week   amoxicillin-clavulanate (AUGMENTIN) 875-125 MG tablet Take 1 tablet by mouth 2 (two) times daily. (Patient not taking: Reported on 05/10/2023)   Not Taking   apixaban (ELIQUIS) 5 MG TABS tablet Take 1 tablet (5 mg total) by mouth 2 (two) times daily. (Patient not taking: Reported on 05/10/2023) 90 tablet 7 Not Taking   aspirin EC 81 MG EC tablet Take 1 tablet (81 mg total) by mouth daily. (Patient not taking: Reported on 05/10/2023)   Not Taking   atorvastatin (LIPITOR) 40 MG tablet Take 40 mg by mouth daily. (Patient not taking: Reported on 05/10/2023)   Not Taking   carvedilol (COREG) 6.25 MG tablet Take 1 tablet (6.25 mg total) by mouth 2 (two) times daily with a meal. (Patient not taking:  Reported on 05/10/2023) 120 tablet 0 Not Taking   cholecalciferol (VITAMIN D) 1000 units tablet Take 1,000 Units by mouth daily. (Patient not taking: Reported on 05/10/2023)   Not Taking   EPINEPHrine 0.3 mg/0.3 mL IJ SOAJ injection Inject 0.3 mg into the muscle as needed for anaphylaxis. Follow package instructions as needed for severe allergy or anaphylactic reaction. (Patient not taking: Reported on 05/10/2023) 2 each 3 Not Taking   Fluticasone-Salmeterol (ADVAIR) 250-50 MCG/DOSE AEPB Inhale 1 puff into the lungs 2 (two) times daily. (Patient not taking: Reported on 05/10/2023)   Not Taking   furosemide (LASIX) 40 MG tablet Take 1.5 tablets (60 mg total) by mouth daily. (Patient not taking: Reported on 05/10/2023) 90 tablet 7 Not Taking   ketoconazole (NIZORAL) 2 % cream Apply to both feet and between toes once daily for 6 weeks. (Patient not taking: Reported on 05/10/2023) 60 g 1 Not Taking   losartan (COZAAR) 50 MG tablet Take 1 tablet (50 mg total) by mouth daily. (Patient not taking: Reported on 05/10/2023) 30 tablet 0 Not Taking   nitroGLYCERIN (NITROSTAT) 0.4 MG SL tablet Place 1 tablet (0.4 mg total) under the tongue every  5 (five) minutes as needed for chest pain. (Patient not taking: Reported on 05/10/2023) 25 tablet 3 Not Taking   oxyCODONE-acetaminophen (PERCOCET/ROXICET) 5-325 MG tablet Take 1 tablet by mouth every 6 (six) hours as needed for moderate pain. (Patient not taking: Reported on 05/10/2023) 12 tablet 0 Not Taking    Discontinued Meds:   Medications Discontinued During This Encounter  Medication Reason   acetaminophen (TYLENOL) tablet 1,000 mg    docusate sodium (COLACE) capsule 100 mg    polyethylene glycol (MIRALAX / GLYCOLAX) packet 17 g    calcium gluconate 1 g/ 50 mL sodium chloride IVPB    fentaNYL in NS (56mcg/ml) infusion-PREMIX    fentaNYL (SUBLIMAZE) bolus via infusion 50-100 mcg    labetalol (NORMODYNE) injection 10 mg    Chlorhexidine Gluconate Cloth 2 %  PADS 6 each Duplicate   vancomycin (VANCOREADY) IVPB 1250 mg/250 mL    Ampicillin-Sulbactam (UNASYN) 3 g in sodium chloride 0.9 % 100 mL IVPB    PHENObarbital (LUMINAL) tablet 200 mg    dexmedetomidine (PRECEDEX) 400 MCG/100ML (4 mcg/mL) infusion    propofol (DIPRIVAN) 1000 MG/100ML infusion    midazolam (VERSED) injection 1-2 mg    norepinephrine (LEVOPHED) 4mg  in (0.016 mg/mL) premix infusion    budesonide (PULMICORT) nebulizer solution 0.25 mg    methylPREDNISolone sodium succinate (SOLU-MEDROL) 40 mg/mL injection 40 mg    Oral care mouth rinse    feeding supplement (VITAL AF 1.2 CAL) liquid 1,000 mL    feeding supplement (PROSource TF20) liquid 60 mL    free water 30 mL    famotidine (PEPCID) tablet 20 mg    docusate (COLACE) 50 MG/5ML liquid 100 mg    polyethylene glycol (MIRALAX / GLYCOLAX) packet 17 g    lactulose (CHRONULAC) 10 GM/15ML solution 30 g     Current medications: Current Facility-Administered Medications  Medication Dose Route Frequency Provider Last Rate Last Admin   acetaminophen (TYLENOL) tablet 650 mg  650 mg Per Tube Q4H PRN Rust-Chester, Cecelia Byars, NP   650 mg at 05/09/23 0535   azithromycin (ZITHROMAX) 500 mg in sodium chloride 0.9 % 250 mL IVPB  500 mg Intravenous Q24H Rust-Chester, Cecelia Byars, NP   Stopped at 05/10/23 1908   Chlorhexidine Gluconate Cloth 2 % PADS 6 each  6 each Topical Daily Vida Rigger, MD   6 each at 05/11/23 1049   docusate (COLACE) 50 MG/5ML liquid 100 mg  100 mg Per Tube BID PRN Rockwell Alexandria, RPH       docusate (COLACE) 50 MG/5ML liquid 100 mg  100 mg Oral BID Vida Rigger, MD       famotidine (PEPCID) tablet 20 mg  20 mg Oral BID Vida Rigger, MD       feeding supplement (ENSURE ENLIVE / ENSURE PLUS) liquid 237 mL  237 mL Oral BID BM Aleskerov, Fuad, MD       fentaNYL (SUBLIMAZE) injection 50 mcg  50 mcg Intravenous Q15 min PRN Rust-Chester, Micheline Rough L, NP       fentaNYL (SUBLIMAZE) injection 50-200 mcg  50-200 mcg  Intravenous Q30 min PRN Rust-Chester, Britton L, NP   100 mcg at 05/10/23 1044   heparin ADULT infusion 100 units/mL (25000 units/249mL)  1,200 Units/hr Intravenous Continuous Lowella Bandy, RPH 12 mL/hr at 05/11/23 1210 1,200 Units/hr at 05/11/23 1210   insulin aspart (novoLOG) injection 0-15 Units  0-15 Units Subcutaneous Q4H Rust-Chester, Cecelia Byars, NP   2 Units at 05/10/23 1545   ipratropium-albuterol (  DUONEB) 0.5-2.5 (3) MG/3ML nebulizer solution 3 mL  3 mL Nebulization Q6H Rust-Chester, Britton L, NP   3 mL at 05/11/23 0981   ipratropium-albuterol (DUONEB) 0.5-2.5 (3) MG/3ML nebulizer solution 3 mL  3 mL Nebulization Q4H PRN Rust-Chester, Micheline Rough L, NP       labetalol (NORMODYNE) injection 5 mg  5 mg Intravenous Q2H PRN Rust-Chester, Micheline Rough L, NP       lactulose (CHRONULAC) 10 GM/15ML solution 30 g  30 g Oral TID Vida Rigger, MD       methylPREDNISolone sodium succinate (SOLU-MEDROL) 40 mg/mL injection 40 mg  40 mg Intravenous Q24H Vida Rigger, MD       mupirocin ointment (BACTROBAN) 2 % 1 Application  1 Application Nasal BID Vida Rigger, MD   1 Application at 05/11/23 1051   Oral care mouth rinse  15 mL Mouth Rinse PRN Vida Rigger, MD       PHENObarbital (LUMINAL) tablet 200 mg  200 mg Per Tube QHS Rust-Chester, Britton L, NP   200 mg at 05/10/23 2235   phosphorus (K PHOS NEUTRAL) tablet 500 mg  500 mg Per Tube Once Lowella Bandy, RPH       polyethylene glycol (MIRALAX / GLYCOLAX) packet 17 g  17 g Per Tube Daily PRN Rockwell Alexandria, RPH       [START ON 05/12/2023] polyethylene glycol (MIRALAX / GLYCOLAX) packet 17 g  17 g Oral Daily Vida Rigger, MD       QUEtiapine (SEROQUEL) tablet 25 mg  25 mg Oral QHS Jimmye Norman, NP          Allergies: Allergies  Allergen Reactions   Bee Venom Anaphylaxis and Swelling   Hornet Venom Shortness Of Breath and Swelling   Influenza Vaccines Other (See Comments)    Seizures    Peanuts [Peanut Oil] Anaphylaxis    Phenytoin Hives and Rash   Valproic Acid Hives and Rash   Lamotrigine Rash      Past Medical History: Past Medical History:  Diagnosis Date   CAD (coronary artery disease)    a. 04/2015 Cath: LM nl, LAD 10ost/p, RI nl, LCX 10p, OM1/2/3 nl, RCA nl. Nl LV fxn.   Chronic heart failure with preserved ejection fraction (HFpEF) (HCC)    a 03/2021 Echo: EF 60-65%, no rwma, mild LVH, nl RV fxn, mild MR, Ao sclerosis.   Depression    Hyperlipidemia    LVH (left ventricular hypertrophy)    Seizure (HCC)    Sleep apnea    SSS (sick sinus syndrome) (HCC)    a. 04/2015 s/p BSX DC PPM (ser #[id number]).   Vitamin D deficiency      Past Surgical History: Past Surgical History:  Procedure Laterality Date   CARDIAC CATHETERIZATION N/A 05/04/2015   Procedure: Left Heart Cath and Coronary Angiography;  Surgeon: Iran Ouch, MD;  Location: [facility] INVASIVE CV LAB;  Service: Cardiovascular;  Laterality: N/A;   EP IMPLANTABLE DEVICE N/A 05/04/2015   Procedure: Pacemaker Implant;  Surgeon: Marinus Maw, MD;  [facility] IN[facility] CV LAB;  Service: Cardiovascular;  Laterality: N/A;   PPM GENERATOR CHANGEOUT N/A 01/23/2023   Procedure: PPM GENERATOR CHANGEOUT;  Surgeon: Marinus Maw, MD;  Location: [facility] INVASIVE CV LAB;  Service: Cardiovascular;  Laterality: N/A;     Family History: Family History  Problem Relation Age of Onset   Hypertension Mother    Emphysema Mother    Diabetes Other        sibling  Heart disease Other        sibling   Hypertension Brother      Social History: Social History   Socioeconomic History   Marital status: Single    Spouse name: Not on file   Number of children: Not on file   Years of education: Not on file   Highest education level: Not on file  Occupational History   Not on file  Tobacco Use   Smoking status: Every Day    Current packs/day: 0.50    Average packs/day: 0.5 packs/day for 20.0 years (10.0 ttl pk-yrs)    Types: Cigarettes   Smokeless  tobacco: Never   Tobacco comments:    6 a day.  Vaping Use   Vaping status: Never Used  Substance and Sexual Activity   Alcohol use: No   Drug use: No   Sexual activity: Not Currently  Other Topics Concern   Not on file  Social History Narrative   Not on file   Social Drivers of Health   Financial Resource Strain: Not on file  Food Insecurity: Patient Unable To Answer (05/09/2023)   Hunger Vital Sign    Worried About Running Out of Food in the Last Year: Patient unable to answer    Ran Out of Food in the Last Year: Patient unable to answer  Transportation Needs: Patient Unable To Answer (05/09/2023)   PRAPARE - Transportation    Lack of Transportation (Medical): Patient unable to answer    Lack of Transportation (Non-Medical): Patient unable to answer  Physical Activity: Not on file  Stress: Not on file  Social Connections: Not on file  Intimate Partner Violence: Patient Unable To Answer (05/09/2023)   Humiliation, Afraid, Rape, and Kick questionnaire    Fear of Current or Ex-Partner: Patient unable to answer    Emotionally Abused: Patient unable to answer    Physically Abused: Patient unable to answer    Sexually Abused: Patient unable to answer     Review of Systems: As per HPI  Vital Signs: Blood pressure (!) 166/72, pulse 98, temperature (!) 101.3 F (38.5 C), temperature source Axillary, resp. rate (!) 26, height 5\' 10"  (1.778 m), weight (!) 138.1 kg, SpO2 (!) 89%.  Weight trends: Filed Weights   05/09/23 0428 05/10/23 0500 05/11/23 0500  Weight: (!) 137 kg (!) 142.5 kg (!) 138.1 kg    Physical Exam: Physical Exam: General:  No acute distress  Head:  Normocephalic, atraumatic. Moist oral mucosal membranes  Eyes:  Anicteric  Neck:  Supple  Lungs:   Clear to auscultation, normal effort  Heart:  S1S2 no rubs  Abdomen:   Soft, nontender, bowel sounds present  Extremities:  peripheral edema.  Neurologic:  Awake, alert, following commands  Skin:  No lesions   Access:     Lab results:  Basic Metabolic Panel: Recent Labs  Lab 05/08/23 1828 05/09/23 0632 05/09/23 1322 05/09/23 2018 05/10/23 0411 05/11/23 1042 05/11/23 1043  NA 136 135 136  --  141  --  138  K 5.1 5.9* 5.5* 4.8 4.4  --  3.6  CL 104 105 106  --  107  --  106  CO2 23 21* 21*  --  25  --  24  GLUCOSE 135* 155* 157*  --  208*  --  113*  BUN 56* 64* 69*  --  80*  --  78*  CREATININE 2.28* 2.84* 3.41*  --  3.62*  --  3.20*  CALCIUM 7.8* 7.2* 7.1*  --  7.4*  --  8.0*  MG  --  2.7*  --   --  2.9* 2.8*  --   PHOS  --  5.1*  --   --  2.8 1.5*  --     Creat  Date/Time Value Ref Range Status  05/16/2015 03:35 PM 0.68 (L) 0.70 - 1.33 mg/dL Final   Creatinine, Ser  Date/Time Value Ref Range Status  05/11/2023 10:43 AM 3.20 (H) 0.61 - 1.24 mg/dL Final  40/98/1191 47:82 AM 3.62 (H) 0.61 - 1.24 mg/dL Final  95/62/1308 65:78 PM 3.41 (H) 0.61 - 1.24 mg/dL Final  46/96/2952 84:13 AM 2.84 (H) 0.61 - 1.24 mg/dL Final  24/40/1027 25:36 PM 2.28 (H) 0.61 - 1.24 mg/dL Final  64/40/3474 25:95 PM 0.79 0.76 - 1.27 mg/dL Final  63/87/5643 32:95 PM 0.61 0.61 - 1.24 mg/dL Final  18/84/1660 63:01 PM 0.80 0.61 - 1.24 mg/dL Final  60/11/9321 55:73 AM 0.66 0.61 - 1.24 mg/dL Final  22/03/5425 06:23 AM 0.68 0.61 - 1.24 mg/dL Final  76/28/3151 76:16 PM 1.01 0.61 - 1.24 mg/dL Final  07/37/1062 69:48 PM 0.70 0.61 - 1.24 mg/dL Final  54/62/7035 00:93 AM 0.70 0.61 - 1.24 mg/dL Final  81/82/9937 16:96 PM 0.96 0.61 - 1.24 mg/dL Final  78/93/8101 75:10 PM 0.87 0.61 - 1.24 mg/dL Final  25/85/2778 24:23 PM 0.81 0.76 - 1.27 mg/dL Final  53/61/4431 54:00 PM 0.81 0.76 - 1.27 mg/dL Final  86/76/1950 93:26 PM 0.72 (L) 0.76 - 1.27 mg/dL Final  71/24/5809 98:33 PM 0.76 0.76 - 1.27 mg/dL Final  82/50/5397 67:34 AM 0.86 0.61 - 1.24 mg/dL Final  19/37/9024 09:73 PM 0.71 0.61 - 1.24 mg/dL Final  53/29/9242 68:34 AM 0.78 0.61 - 1.24 mg/dL Final  19/62/2297 98:92 AM 0.77 0.61 - 1.24 mg/dL Final     CBC: Recent Labs  Lab 05/08/23 1828 05/09/23 0632 05/10/23 0411 05/11/23 1043  WBC 14.6* 13.9* 14.6* 16.2*  NEUTROABS 11.5*  --   --  PENDING  HGB 16.1 15.1 13.0 14.1  HCT 50.6 47.9 39.9 41.6  MCV 90.2 90.4 86.9 82.4  PLT 128* 122* 114* 202    Microbiology: Results for orders placed or performed during the hospital encounter of 05/08/23  Resp panel by RT-PCR (RSV, Flu A&B, Covid) Anterior Nasal Swab     Status: None   Collection Time: 05/08/23  6:28 PM   Specimen: Anterior Nasal Swab  Result Value Ref Range Status   SARS Coronavirus 2 by RT PCR NEGATIVE NEGATIVE Final    Comment: (NOTE) SARS-CoV-2 target nucleic acids are NOT DETECTED.  The SARS-CoV-2 RNA is generally detectable in upper respiratory specimens during the acute phase of infection. The lowest concentration of SARS-CoV-2 viral copies this assay can detect is 138 copies/mL. A negative result does not preclude SARS-Cov-2 infection and should not be used as the sole basis for treatment or other patient management decisions. A negative result may occur with  improper specimen collection/handling, submission of specimen other than nasopharyngeal swab, presence of viral mutation(s) within the areas targeted by this assay, and inadequate number of viral copies(<138 copies/mL). A negative result must be combined with clinical observations, patient history, and epidemiological information. The expected result is Negative.  Fact Sheet for Patients:  BloggerCourse.com  Fact Sheet for Healthcare Providers:  SeriousBroker.it  This test is no t yet approved or cleared by the [place] FDA and  has been authorized for detection and/or diagnosis of SARS-CoV-2 by FDA under an Emergency Use Authorization (EUA). This EUA  will remain  in effect (meaning this test can be used) for the duration of the COVID-19 declaration under Section 564(b)(1) of the Act,  21 U.S.C.section 360bbb-3(b)(1), unless the authorization is terminated  or revoked sooner.       Influenza A by PCR NEGATIVE NEGATIVE Final   Influenza B by PCR NEGATIVE NEGATIVE Final    Comment: (NOTE) The Xpert Xpress SARS-CoV-2/FLU/RSV plus assay is intended as an aid in the diagnosis of influenza from Nasopharyngeal swab specimens and should not be used as a sole basis for treatment. Nasal washings and aspirates are unacceptable for Xpert Xpress SARS-CoV-2/FLU/RSV testing.  Fact Sheet for Patients: BloggerCourse.com  Fact Sheet for Healthcare Providers: SeriousBroker.it  This test is not yet approved or cleared by the [place] FDA and has been authorized for detection and/or diagnosis of SARS-CoV-2 by FDA under an Emergency Use Authorization (EUA). This EUA will remain in effect (meaning this test can be used) for the duration of the COVID-19 declaration under Section 564(b)(1) of the Act, 21 U.S.C. section 360bbb-3(b)(1), unless the authorization is terminated or revoked.     Resp Syncytial Virus by PCR NEGATIVE NEGATIVE Final    Comment: (NOTE) Fact Sheet for Patients: BloggerCourse.com  Fact Sheet for Healthcare Providers: SeriousBroker.it  This test is not yet approved or cleared by the [place] FDA and has been authorized for detection and/or diagnosis of SARS-CoV-2 by FDA under an Emergency Use Authorization (EUA). This EUA will remain in effect (meaning this test can be used) for the duration of the COVID-19 declaration under Section 564(b)(1) of the Act, 21 U.S.C. section 360bbb-3(b)(1), unless the authorization is terminated or revoked.  Performed at [facility], [address] [place], [place] [address]   Blood Culture (routine x 2)     Status: None (Preliminary result)   Collection Time: 05/08/23  6:28 PM   Specimen: BLOOD  Result  Value Ref Range Status   Specimen Description BLOOD BLOOD RIGHT ARM  Final   Special Requests   Final    BOTTLES DRAWN AEROBIC AND ANAEROBIC Blood Culture adequate volume   Culture   Final    NO GROWTH 3 DAYS Performed at [facility], [address], [place], [place] [address]    Report Status PENDING  Incomplete  Blood Culture (routine x 2)     Status: Abnormal   Collection Time: 05/08/23  6:28 PM   Specimen: BLOOD LEFT ARM  Result Value Ref Range Status   Specimen Description   Final    BLOOD LEFT ARM Performed at [facility], [address], [place], [place] [address]    Special Requests   Final    BOTTLES DRAWN AEROBIC AND ANAEROBIC Blood Culture results may not be optimal due to an inadequate volume of blood received in culture bottles Performed at [facility], [address] [place], [place] [address]    Culture  Setup Time   Final    GRAM POSITIVE COCCI IN BOTH AEROBIC AND ANAEROBIC BOTTLES CRITICAL RESULT CALLED TO, READ BACK BY AND VERIFIED WITH: SHEEMA HALLAJI 05/09/23 1234 MW    Culture (A)  Final    STAPHYLOCOCCUS EPIDERMIDIS STAPHYLOCOCCUS HOMINIS THE SIGNIFICANCE OF ISOLATING THIS ORGANISM FROM A SINGLE SET OF BLOOD CULTURES WHEN MULTIPLE SETS ARE DRAWN IS UNCERTAIN. PLEASE NOTIFY THE MICROBIOLOGY DEPARTMENT WITHIN ONE WEEK IF SPECIATION AND SENSITIVITIES ARE REQUIRED. Performed at [facility], [address] [place], [place] [address]    Report Status  05/11/2023 FINAL  Final  Blood Culture ID Panel (Reflexed)     Status: Abnormal   Collection Time: 05/08/23  6:28 PM  Result Value Ref Range Status   Enterococcus faecalis NOT DETECTED NOT DETECTED Final   Enterococcus Faecium NOT DETECTED NOT DETECTED Final   Listeria monocytogenes NOT DETECTED NOT DETECTED Final   Staphylococcus species DETECTED (A) NOT DETECTED Final    Comment: CRITICAL RESULT CALLED TO, READ BACK BY AND VERIFIED WITH: SHEEMA HALLAJI 05/09/23 1234 MW     Staphylococcus aureus (BCID) NOT DETECTED NOT DETECTED Final   Staphylococcus epidermidis DETECTED (A) NOT DETECTED Final    Comment: Methicillin (oxacillin) resistant coagulase negative staphylococcus. Possible blood culture contaminant (unless isolated from more than one blood culture draw or clinical case suggests pathogenicity). No antibiotic treatment is indicated for blood  culture contaminants. CRITICAL RESULT CALLED TO, READ BACK BY AND VERIFIED WITH: [facility] 05/09/23 1234 MW    Staphylococcus lugdunensis NOT DETECTED NOT DETECTED Final   Streptococcus species NOT DETECTED NOT DETECTED Final   Streptococcus agalactiae NOT DETECTED NOT DETECTED Final   Streptococcus pneumoniae NOT DETECTED NOT DETECTED Final   Streptococcus pyogenes NOT DETECTED NOT DETECTED Final   A.calcoaceticus-baumannii NOT DETECTED NOT DETECTED Final   Bacteroides fragilis NOT DETECTED NOT DETECTED Final   Enterobacterales NOT DETECTED NOT DETECTED Final   Enterobacter cloacae complex NOT DETECTED NOT DETECTED Final   Escherichia coli NOT DETECTED NOT DETECTED Final   Klebsiella aerogenes NOT DETECTED NOT DETECTED Final   Klebsiella oxytoca NOT DETECTED NOT DETECTED Final   Klebsiella pneumoniae NOT DETECTED NOT DETECTED Final   Proteus species NOT DETECTED NOT DETECTED Final   Salmonella species NOT DETECTED NOT DETECTED Final   Serratia marcescens NOT DETECTED NOT DETECTED Final   Haemophilus influenzae NOT DETECTED NOT DETECTED Final   Neisseria meningitidis NOT DETECTED NOT DETECTED Final   Pseudomonas aeruginosa NOT DETECTED NOT DETECTED Final   Stenotrophomonas maltophilia NOT DETECTED NOT DETECTED Final   Candida albicans NOT DETECTED NOT DETECTED Final   Candida auris NOT DETECTED NOT DETECTED Final   Candida glabrata NOT DETECTED NOT DETECTED Final   Candida krusei NOT DETECTED NOT DETECTED Final   Candida parapsilosis NOT DETECTED NOT DETECTED Final   Candida tropicalis NOT DETECTED NOT DETECTED  Final   Cryptococcus neoformans/gattii NOT DETECTED NOT DETECTED Final   Methicillin resistance mecA/C DETECTED (A) NOT DETECTED Final    Comment: CRITICAL RESULT CALLED TO, READ BACK BY AND VERIFIED WITH: Cher Nakai 05/09/23 1234 MW Performed at [facility], [address] [place], [place] [address]   MRSA Next Gen by PCR, Nasal     Status: None   Collection Time: 05/09/23  8:55 AM   Specimen: Nasal Mucosa; Nasal Swab  Result Value Ref Range Status   MRSA by PCR Next Gen NOT DETECTED NOT DETECTED Final    Comment: (NOTE) The GeneXpert MRSA Assay (FDA approved for NASAL specimens only), is one component of a comprehensive MRSA colonization surveillance program. It is not intended to diagnose MRSA infection nor to guide or monitor treatment for MRSA infections. Test performance is not FDA approved in patients less than 36 years old. Performed at [facility], [address] [place], [place] [address]   Respiratory (~20 pathogens) panel by PCR     Status: Abnormal   Collection Time: 05/09/23 11:12 AM   Specimen: Nasopharyngeal Swab; Respiratory  Result Value Ref Range Status   Adenovirus DETECTED (A) NOT DETECTED Final  Coronavirus 229E NOT DETECTED NOT DETECTED Final    Comment: (NOTE) The Coronavirus on the Respiratory Panel, DOES NOT test for the novel  Coronavirus (2019 nCoV)    Coronavirus HKU1 NOT DETECTED NOT DETECTED Final   Coronavirus NL63 NOT DETECTED NOT DETECTED Final   Coronavirus OC43 NOT DETECTED NOT DETECTED Final   Metapneumovirus NOT DETECTED NOT DETECTED Final   Rhinovirus / Enterovirus NOT DETECTED NOT DETECTED Final   Influenza A NOT DETECTED NOT DETECTED Final   Influenza B NOT DETECTED NOT DETECTED Final   Parainfluenza Virus 1 NOT DETECTED NOT DETECTED Final   Parainfluenza Virus 2 NOT DETECTED NOT DETECTED Final   Parainfluenza Virus 3 NOT DETECTED NOT DETECTED Final   Parainfluenza Virus 4 NOT DETECTED NOT DETECTED Final    Respiratory Syncytial Virus NOT DETECTED NOT DETECTED Final   Bordetella pertussis NOT DETECTED NOT DETECTED Final   Bordetella Parapertussis NOT DETECTED NOT DETECTED Final   Chlamydophila pneumoniae NOT DETECTED NOT DETECTED Final   Mycoplasma pneumoniae NOT DETECTED NOT DETECTED Final    Comment: Performed at [facility], [address] [place], [place] [address]    Urinalysis: Recent Labs    05/08/23 1828  COLORURINE AMBER*  LABSPEC 1.016  PHURINE 5.0  GLUCOSEU NEGATIVE  HGBUR MODERATE*  BILIRUBINUR NEGATIVE  KETONESUR NEGATIVE  PROTEINUR 100*  NITRITE NEGATIVE  LEUKOCYTESUR NEGATIVE     Imaging:  ECHOCARDIOGRAM COMPLETE Result Date: 05/10/2023    ECHOCARDIOGRAM REPORT   Patient Name:   KAMARON DESKINS Mattos Date of Exam: 05/10/2023 Medical Rec #:  [id number]       Height:       70.0 in Accession #:    [id number]      Weight:       314.2 lb Date of Birth:  [birth year]       BSA:          2.529 m Patient Age:    57 years        BP:           124/66 mmHg Patient Gender: M               HR:           84 bpm. Exam Location:  [facility] Procedure: 2D Echo, Cardiac Doppler, Color Doppler and Strain Analysis (Both            Spectral and Color Flow Doppler were utilized during procedure). Indications:     Syncope  History:         Patient has prior history of Echocardiogram examinations, most                  recent 04/14/2021. CHF, CAD, Pacemaker, COPD,                  Arrythmias:Bradycardia, Signs/Symptoms:Syncope, Chest Pain,                  Edema and Shortness of Breath; Risk Factors:Hypertension, Sleep                  Apnea, Dyslipidemia and Current Smoker.  Sonographer:     Mikki Harbor Referring Phys:  [id number] BRITTON L [place] Diagnosing Phys: Debbe Odea MD  Sonographer Comments: Technically difficult study due to poor echo windows and patient is obese. Image acquisition challenging due to COPD. Global longitudinal strain was attempted. IMPRESSIONS  1. Left ventricular  ejection fraction, by estimation, is 55 to 60%. Left ventricular ejection fraction  by PLAX is 57 %. The left ventricle has normal function. The left ventricle has no regional wall motion abnormalities. There is mild left ventricular hypertrophy. Left ventricular diastolic parameters were normal.  2. Right ventricular systolic function is normal. The right ventricular size is normal. There is normal pulmonary artery systolic pressure.  3. The mitral valve is normal in structure. No evidence of mitral valve regurgitation.  4. The aortic valve is tricuspid. Aortic valve regurgitation is not visualized.  5. Aortic dilatation noted. There is borderline dilatation of the aortic root, measuring 38 mm.  6. The inferior vena cava is normal in size with greater than 50% respiratory variability, suggesting right atrial pressure of 3 mmHg. FINDINGS  Left Ventricle: Left ventricular ejection fraction, by estimation, is 55 to 60%. Left ventricular ejection fraction by PLAX is 57 %. The left ventricle has normal function. The left ventricle has no regional wall motion abnormalities. Global longitudinal strain performed but not reported based on interpreter judgement due to suboptimal tracking. The left ventricular internal cavity size was normal in size. There is mild left ventricular hypertrophy. Left ventricular diastolic parameters were  normal. Right Ventricle: The right ventricular size is normal. No increase in right ventricular wall thickness. Right ventricular systolic function is normal. There is normal pulmonary artery systolic pressure. The tricuspid regurgitant velocity is 1.54 m/s, and  with an assumed right atrial pressure of 8 mmHg, the estimated right ventricular systolic pressure is 17.5 mmHg. Left Atrium: Left atrial size was normal in size. Right Atrium: Right atrial size was normal in size. Pericardium: There is no evidence of pericardial effusion. Mitral Valve: The mitral valve is normal in structure. No  evidence of mitral valve regurgitation. MV peak gradient, 2.8 mmHg. The mean mitral valve gradient is 1.0 mmHg. Tricuspid Valve: The tricuspid valve is normal in structure. Tricuspid valve regurgitation is not demonstrated. Aortic Valve: The aortic valve is tricuspid. Aortic valve regurgitation is not visualized. Aortic valve mean gradient measures 4.0 mmHg. Aortic valve peak gradient measures 9.0 mmHg. Aortic valve area, by VTI measures 3.24 cm. Pulmonic Valve: The pulmonic valve was normal in structure. Pulmonic valve regurgitation is not visualized. Aorta: Aortic dilatation noted. There is borderline dilatation of the aortic root, measuring 38 mm. Venous: The inferior vena cava is normal in size with greater than 50% respiratory variability, suggesting right atrial pressure of 3 mmHg. IAS/Shunts: No atrial level shunt detected by color flow Doppler. Additional Comments: 3D was performed not requiring image post processing on an independent workstation and was indeterminate.  LEFT VENTRICLE PLAX 2D LV EF:         Left            Diastology                ventricular     LV e' medial:    11.70 cm/s                ejection        LV E/e' medial:  6.7                fraction by     LV e' lateral:   14.10 cm/s                PLAX is 57      LV E/e' lateral: 5.6                %. LVIDd:         5.30  cm LVIDs:         3.70 cm LV PW:         1.20 cm LV IVS:        1.10 cm LVOT diam:     2.10 cm LV SV:         86 LV SV Index:   34 LVOT Area:     3.46 cm  LV Volumes (MOD) LV vol d, MOD    48.1 ml A2C: LV vol d, MOD    83.7 ml A4C: LV vol s, MOD    23.4 ml A2C: LV vol s, MOD    39.1 ml A4C: LV SV MOD A2C:   24.7 ml LV SV MOD A4C:   83.7 ml LV SV MOD BP:    34.5 ml RIGHT VENTRICLE RV Basal diam:  4.40 cm LEFT ATRIUM             Index        RIGHT ATRIUM           Index LA diam:        3.70 cm 1.46 cm/m   RA Area:     22.20 cm LA Vol (A2C):   53.6 ml 21.19 ml/m  RA Volume:   69.90 ml  27.64 ml/m LA Vol (A4C):   55.0 ml  21.75 ml/m LA Biplane Vol: 55.3 ml 21.87 ml/m  AORTIC VALVE                    PULMONIC VALVE AV Area (Vmax):    2.70 cm     PV Vmax:       0.92 m/s AV Area (Vmean):   2.66 cm     PV Peak grad:  3.4 mmHg AV Area (VTI):     3.24 cm AV Vmax:           150.00 cm/s AV Vmean:          95.300 cm/s AV VTI:            0.264 m AV Peak Grad:      9.0 mmHg AV Mean Grad:      4.0 mmHg LVOT Vmax:         117.00 cm/s LVOT Vmean:        73.200 cm/s LVOT VTI:          0.247 m LVOT/AV VTI ratio: 0.94  AORTA Ao Root diam: 3.80 cm Ao Asc diam:  3.60 cm MITRAL VALVE               TRICUSPID VALVE MV Area (PHT): 3.91 cm    TR Peak grad:   9.5 mmHg MV Area VTI:   3.65 cm    TR Vmax:        154.00 cm/s MV Peak grad:  2.8 mmHg MV Mean grad:  1.0 mmHg    SHUNTS MV Vmax:       0.83 m/s    Systemic VTI:  0.25 m MV Vmean:      47.2 cm/s   Systemic Diam: 2.10 cm MV Decel Time: 194 msec MV E velocity: 78.50 cm/s MV A velocity: 69.30 cm/s MV E/A ratio:  1.13 Debbe Odea MD Electronically signed by Debbe Odea MD Signature Date/Time: 05/10/2023/12:47:01 PM    Final      Assessment & Plan:  58 y.o. male with a PMHx of obese male with history of seizure disorder, hypertension, congestive heart failure, status post pacemaker placement, obstructive sleep apnea,  COPD was admitted with history of unresponsive episode.  He was initially intubated and sedated.  He was found to have pneumonia and was treated with antibiotics.  He is now found to have acute kidney injury.  #1: Acute kidney injury: Acute kidney injury most likely secondary to hemodynamic compromise due to lack of perfusion versus ATN secondary to sepsis.  Will continue to monitor closely.  Obtain a renal ultrasound.  #2: Congestive heart failure: Continue diuretics as needed at this time.  #3: Sepsis/pneumonia: He is presently on azithromycin.  Continue supportive care will monitor closely. Case discussed with ICU attending.   LOS: 3 Lorain Childes,  MD [facility]. 3/22/20251:06 PM

## 2023-05-11 NOTE — Progress Notes (Signed)
 ANTICOAGULATION CONSULT NOTE  Pharmacy Consult for heparin infusion Indication: pulmonary embolus  Allergies  Allergen Reactions   Bee Venom Anaphylaxis and Swelling   Hornet Venom Shortness Of Breath and Swelling   Influenza Vaccines Other (See Comments)    Seizures    Peanuts [Peanut Oil] Anaphylaxis   Phenytoin Hives and Rash   Valproic Acid Hives and Rash   Lamotrigine Rash    Patient Measurements: Height: 5\' 10"  (177.8 cm) Weight: (!) 138.1 kg (304 lb 7.3 oz) IBW/kg (Calculated) : 73 Heparin Dosing Weight: 104.7 kg  Vital Signs: Temp: 100 F (37.8 C) (03/22 0000) Temp Source: Axillary (03/22 0000) BP: 166/67 (03/22 0600) Pulse Rate: 95 (03/22 0600)  Labs: Recent Labs    05/08/23 1828 05/08/23 1828 05/08/23 1950 05/09/23 0632 05/09/23 1322 05/09/23 2018 05/10/23 0411 05/10/23 1224 05/10/23 1905  HGB 16.1  --   --  15.1  --   --  13.0  --   --   HCT 50.6  --   --  47.9  --   --  39.9  --   --   PLT 128*  --   --  122*  --   --  114*  --   --   APTT 37*  --   --  99* 192* 171* 149* 102*  --   LABPROT 15.4*  --   --   --   --   --   --   --   --   INR 1.2  --   --   --   --   --   --   --   --   HEPARINUNFRC  --    < >  --  0.47 0.89* 0.99* 0.74* 0.69 0.59  CREATININE 2.28*  --   --  2.84* 3.41*  --  3.62*  --   --   TROPONINIHS 129*  --  118*  --   --   --   --   --   --    < > = values in this interval not displayed.    Estimated Creatinine Clearance: 30.8 mL/min (A) (by C-G formula based on SCr of 3.62 mg/dL (H)).   Medical History: Past Medical History:  Diagnosis Date   CAD (coronary artery disease)    a. 04/2015 Cath: LM nl, LAD 10ost/p, RI nl, LCX 10p, OM1/2/3 nl, RCA nl. Nl LV fxn.   Chronic heart failure with preserved ejection fraction (HFpEF) (HCC)    a 03/2021 Echo: EF 60-65%, no rwma, mild LVH, nl RV fxn, mild MR, Ao sclerosis.   Depression    Hyperlipidemia    LVH (left ventricular hypertrophy)    Seizure (HCC)    Sleep apnea    SSS  (sick sinus syndrome) (HCC)    a. 04/2015 s/p BSX DC PPM (ser #[id number]).   Vitamin D deficiency     Medications:  PTA Meds: Eliquis, last dose unknown  Assessment: Pt is a 58 yo male with hx of PE on Eliquis presenting to ED found unresponsive, tachypneic, hypoxic at home during wellness check.  Goal of Therapy:  Heparin level 0.3-0.7 units/ml Monitor platelets by anticoagulation protocol: Yes   Plan:  Heparin level SUBtherapeutic  --increase heparin infusion rate to 1200 units/hr --Will continue heparin level dosing now that both levels are correlating --Check next heparin level in 6 hours after rate change --CBC daily while on heparin  Lowella Bandy, PharmD Clinical Pharmacist 05/11/2023 7:07 AM

## 2023-05-11 NOTE — Progress Notes (Signed)
 NAME:  Seth Tucker, MRN:  [id number], DOB:  04-Apr-1965, LOS: 3 ADMISSION DATE:  05/08/2023, CONSULTATION DATE:  05/08/23 REFERRING MD:  Dr. Jodie Echevaria, CHIEF COMPLAINT:  Unresponsive & SOB   History of Present Illness:  58 yo M presenting to [facility] ED from home via EMS for evaluation after being found unresponsive, hypoxic with a pulse.   History provided per chart review and sister telephone conference, as patient is unable to participate in interview at this time. Family reported the patient to be in his normal state of health last spoken to 2 days ago on 05/06/23 when he was headed to the [facility] for an annual check up. Sister reports he had no complaints. When asked about any issues with seizure activity, she said family was concerned that maybe he was continuing to have seizures. She stated the patient would describe falling asleep in his car and waking up 3 hours later with no memory of what occurred and that his face/head would be sore as if he had hit the steering wheel somehow. She believes he has only been taking Phenobarbital. She reports he is a current everyday smoker at about a pack a day, but is trying to quit with a 40 + pack year history. She denies ETOH or recreational drug use.  05/09/23-patietn + for adenovirus on RVP.  Remains hypoxemic and acidemic on repeat blood gas eval.  He's on empiric abx and steroids.  05/10/23- patient weaning from [facility], on propofol weaning protocol for awakening and SBT. Blood culture with contaminant.  S/p extubation today. 05/11/23- patient awake alert , cursing at staff , asking for food wants to leave.  He does have component of altered mentation which may be metabolic in context of hyperammonimea and AKI.  Nephrology consultation is in process. Optimizing for TRH downgrade.   Pertinent  Medical History  Seizure disorder HFpEF SSS s/p PPM placement Pulmonary Embolus on Eliquis CAD HLD OSA  Significant Hospital Events: Including procedures,  antibiotic start and stop dates in addition to other pertinent events   05/08/23: Admit to ICU due to acute hypoxic/ hypercapnic respiratory failure secondary to right basilar pneumonia and suspected small para-pnemonic effusion requiring emergent intubation and mechanical ventilatory support.  Interim History / Subjective:  Patient sedated after intubation, RASS: -5 on mechanical ventilatory support.   Objective   Blood pressure (!) 166/67, pulse 95, temperature 100 F (37.8 C), temperature source Axillary, resp. rate (!) 24, height 5\' 10"  (1.778 m), weight (!) 138.1 kg, SpO2 97%.    Vent Mode: PSV;Spontaneous FiO2 (%):  [40 %-45 %] 40 % Set Rate:  [26 bmp] 26 bmp Vt Set:  [550 mL] 550 mL PEEP:  [5 cmH20] 5 cmH20 Pressure Support:  [5 cmH20] 5 cmH20 Plateau Pressure:  [20 cmH20] 20 cmH20   Intake/Output Summary (Last 24 hours) at 05/11/2023 6387 Last data filed at 05/11/2023 0600 Gross per 24 hour  Intake 1116.05 ml  Output 1850 ml  Net -733.95 ml   Filed Weights   05/09/23 0428 05/10/23 0500 05/11/23 0500  Weight: (!) 137 kg (!) 142.5 kg (!) 138.1 kg    Examination: General: Adult male, NAD HEENT: MM pink/moist, red schlera, atraumatic, neck supple Neuro: no FND grossly s/p extubation CV: s1s2 RRR, NSR on monitor, no r/m/g Pulm: Regular, non labored mild rhonchi diminished-BLL GI: soft, rounded, bs x 4 GU: foley in place with clear yellow urine Skin:  no rashes/lesions noted Extremities: warm/dry, pulses + 2 R/P, +1 edema noted BLE  IMAGING   COMPARISON:  Chest radiographs earlier today.  Chest CT 03/28/2022   FINDINGS: CT CHEST FINDINGS   Cardiovascular: The heart is upper normal in size. Pacemaker wires in the right atrium and ventricle. Aortic atherosclerosis without aneurysm. Dilated main pulmonary artery at 4 cm. No pericardial effusion.   Mediastinum/Nodes: Enteric tube decompresses the esophagus. Endotracheal tube tip above the carina. Mediastinal  adenopathy including a 16 mm anterior paratracheal node, series 2, image 31. Assessment for hilar adenopathy is limited in the absence of IV contrast. No suspicious thyroid nodule.   Lungs/Pleura: Extends multifocal ground-glass and consolidative opacities throughout both lungs. There is both lung and all lobar involvement. Greatest airspace disease is in the right middle lobe and lingula with air bronchograms. No cavitary components. There is moderate bronchial thickening. Trace right pleural effusion.   Musculoskeletal: There are no acute or suspicious osseous abnormalities. Left-sided pacemaker.   CT ABDOMEN PELVIS FINDINGS   Hepatobiliary: Hepatic steatosis. No evidence of focal hepatic abnormality on this unenhanced exam. Gallbladder physiologically distended, no calcified stone. No biliary dilatation.   Pancreas: No ductal dilatation or inflammation.   Spleen: Normal in size without focal abnormality.   Adrenals/Urinary Tract: Normal adrenal glands. 4 mm nonobstructing stone in the right kidney. No hydronephrosis or renal inflammation. Decompressed ureters. Urinary bladder is decompressed by Foley catheter.   Stomach/Bowel: Enteric tube tip in the stomach. There is no bowel obstruction or inflammation. Normal appendix.   Vascular/Lymphatic: Aortic atherosclerosis. No aortic aneurysm. Shotty periportal and retroperitoneal nodes, likely reactive.   Reproductive: Prostate is unremarkable.   Other: No ascites or free air. Small fat containing umbilical hernia.   Musculoskeletal: There are no acute or suspicious osseous abnormalities. Degenerative change of both hips in the pubic symphysis. Transitional lumbosacral anatomy with sacralization of L5.   IMPRESSION: 1. Extensive multifocal ground-glass and consolidative opacities throughout both lungs, greatest in the right middle lobe and lingula. Findings are consistent with multifocal pneumonia. 2. Trace right pleural  effusion. 3. Mediastinal adenopathy, likely reactive. 4. No acute abnormality in the abdomen/pelvis. 5. Hepatic steatosis. 6. Nonobstructing right renal stone.   Aortic Atherosclerosis (ICD10-I70.0).     Electronically Signed   By: Narda Rutherford M.D.   On: 05/08/2023 21:35  Assessment & Plan:  Acute Hypoxic / Hypercapnic Respiratory Failure secondary to multifocal pneumonia & AECOPD in the setting of acute encephalopathy and suspected aspiration PMHx: OSA on CPAP - F/u cultures, trend PCT-BLOOD CX contaminant staph epi - Continue CAP/Aspiration Pna coverage: unasyn, azithromycin & vancomycin  Acute Kidney Injury -KDGIO 3 - due to septic shock Baseline Cr: 0.79, Cr on admission: 2.28 - Strict I/O's: alert provider if UOP < 0.5 mL/kg/hr - gentle IVF hydration  - Daily BMP, replace electrolytes PRN - Avoid nephrotoxic agents as able, ensure adequate renal perfusion - consider renal US PRN -nephrology consult  Septic shock - present on admission - due to pneumonia -now off pressors    Chronic HFpEF without exacerbation Elevated Troponin but flat suspect secondary to demand ischemia HTN PMHx: CAD, SSS s/p PPM, HLD - Echocardiogram ordered - heparin drip per pharmacy consult for Hx of PE - hold outpatient regimen: apixaban, losartan, carvedilol- consider restarting as patient stabilizes - labetalol IV PRN for SBP > 165 - continuous cardiac monitoring - Consider Cardiology consultation depending on echo results  Acute Encephalopathy due to unknown etiology Chronic Seizure Disorder Concern for seizure activity leading to unresponsive episode. However lactic so far WNL.  - continue outpatient phenobarbital, monitor  LFT's closely - phenobarbital level pending - avoid sedating medications as tolerated - consult neurology depending on work up results - EEG ordered in the AM  Chronic Anticoagulation due to history of PE -start heparin drip - f/u US BLE to r/o DVT, unable  to scan for PE due to AKI  Transaminitis  - Trend hepatic function - Consider RUQ [place] PRN - avoid hepatotoxic agents  Best Practice (right click and "Reselect all SmartList Selections" daily)  Diet/type: NPO w/ meds via tube DVT prophylaxis systemic heparin Pressure ulcer(s): N/A GI prophylaxis: H2B Lines: N/A Foley:  Yes, and it is still needed Code Status:  full code Last date of multidisciplinary goals of care discussion [05/08/23] Sister updated via telephone conference, plan of care discussed. All questions and concerns answered at this time. Labs   CBC: Recent Labs  Lab 05/08/23 1828 05/09/23 0632 05/10/23 0411  WBC 14.6* 13.9* 14.6*  NEUTROABS 11.5*  --   --   HGB 16.1 15.1 13.0  HCT 50.6 47.9 39.9  MCV 90.2 90.4 86.9  PLT 128* 122* 114*    Basic Metabolic Panel: Recent Labs  Lab 05/08/23 1828 05/09/23 0632 05/09/23 1322 05/09/23 2018 05/10/23 0411  NA 136 135 136  --  141  K 5.1 5.9* 5.5* 4.8 4.4  CL 104 105 106  --  107  CO2 23 21* 21*  --  25  GLUCOSE 135* 155* 157*  --  208*  BUN 56* 64* 69*  --  80*  CREATININE 2.28* 2.84* 3.41*  --  3.62*  CALCIUM 7.8* 7.2* 7.1*  --  7.4*  MG  --  2.7*  --   --  2.9*  PHOS  --  5.1*  --   --  2.8   GFR: Estimated Creatinine Clearance: 30.8 mL/min (A) (by C-G formula based on SCr of 3.62 mg/dL (H)). Recent Labs  Lab 05/08/23 1828 05/08/23 1950 05/08/23 2138 05/09/23 0632 05/10/23 0411  PROCALCITON  --   --  4.09 6.68 6.91  WBC 14.6*  --   --  13.9* 14.6*  LATICACIDVEN 1.6 1.9  --   --   --     Liver Function Tests: Recent Labs  Lab 05/08/23 1828 05/09/23 0632  AST 97* 87*  ALT 55* 47*  ALKPHOS 62 58  BILITOT 0.8 1.5*  PROT 8.0 6.7  ALBUMIN 3.5 2.9*   No results for input(s): "LIPASE", "AMYLASE" in the last 168 hours. Recent Labs  Lab 05/09/23 0632  AMMONIA 92*    ABG    Component Value Date/Time   HCO3 21.6 05/09/2023 1323   ACIDBASEDEF 5.2 (H) 05/09/2023 1323   O2SAT 99.9 05/09/2023  1323     Coagulation Profile: Recent Labs  Lab 05/08/23 1828  INR 1.2    Cardiac Enzymes: No results for input(s): "CKTOTAL", "CKMB", "CKMBINDEX", "TROPONINI" in the last 168 hours.  HbA1C: Hgb A1c MFr Bld  Date/Time Value Ref Range Status  05/09/2023 08:18 PM 6.6 (H) 4.8 - 5.6 % Final    Comment:    (NOTE) Pre diabetes:          5.7%-6.4%  Diabetes:              >6.4%  Glycemic control for   <7.0% adults with diabetes     CBG: Recent Labs  Lab 05/10/23 1535 05/10/23 1939 05/10/23 2329 05/11/23 0345 05/11/23 0744  GLUCAP 132* 141* 94 99 115*    Review of Systems:   UTA- patient intubated and  unable to participate in interview at this time.  Past Medical History:  He,  has a past medical history of CAD (coronary artery disease), Chronic heart failure with preserved ejection fraction (HFpEF) (HCC), Depression, Hyperlipidemia, LVH (left ventricular hypertrophy), Seizure (HCC), Sleep apnea, SSS (sick sinus syndrome) (HCC), and Vitamin D deficiency.   Surgical History:   Past Surgical History:  Procedure Laterality Date   CARDIAC CATHETERIZATION N/A 05/04/2015   Procedure: Left Heart Cath and Coronary Angiography;  Surgeon: Iran Ouch, MD;  Location: [facility] INVASIVE CV LAB;  Service: Cardiovascular;  Laterality: N/A;   EP IMPLANTABLE DEVICE N/A 05/04/2015   Procedure: Pacemaker Implant;  Surgeon: Marinus Maw, MD;  Location: [facility] INVASIVE CV LAB;  Service: Cardiovascular;  Laterality: N/A;   PPM GENERATOR CHANGEOUT N/A 01/23/2023   Procedure: PPM GENERATOR CHANGEOUT;  Surgeon: Marinus Maw, MD;  Location: [facility] INVASIVE CV LAB;  Service: Cardiovascular;  Laterality: N/A;     Social History:   reports that he has been smoking cigarettes. He has a 10 pack-year smoking history. He has never used smokeless tobacco. He reports that he does not drink alcohol and does not use drugs.   Family History:  His family history includes Diabetes in an other family member;  Emphysema in his mother; Heart disease in an other family member; Hypertension in his brother and mother.   Allergies Allergies  Allergen Reactions   Bee Venom Anaphylaxis and Swelling   Hornet Venom Shortness Of Breath and Swelling   Influenza Vaccines Other (See Comments)    Seizures    Peanuts [Peanut Oil] Anaphylaxis   Phenytoin Hives and Rash   Valproic Acid Hives and Rash   Lamotrigine Rash     Home Medications  Prior to Admission medications   Medication Sig Start Date End Date Taking? Authorizing Provider  albuterol (PROVENTIL HFA;VENTOLIN HFA) 108 (90 Base) MCG/ACT inhaler Inhale 2 puffs into the lungs every 4 (four) hours as needed for wheezing or shortness of breath.    [provider]  amoxicillin-clavulanate (AUGMENTIN) 875-125 MG tablet Take 1 tablet by mouth 2 (two) times daily. 03/27/22   [provider]  apixaban (ELIQUIS) 5 MG TABS tablet Take 1 tablet (5 mg total) by mouth 2 (two) times daily. 07/03/22   Marinus Maw, MD  aspirin EC 81 MG EC tablet Take 1 tablet (81 mg total) by mouth daily. 05/05/15   Sheilah Pigeon, PA-C  atorvastatin (LIPITOR) 40 MG tablet Take 40 mg by mouth daily. 02/25/15   [provider]  carvedilol (COREG) 6.25 MG tablet Take 1 tablet (6.25 mg total) by mouth 2 (two) times daily with a meal. 03/29/22   Marrion Coy, MD  cholecalciferol (VITAMIN D) 1000 units tablet Take 1,000 Units by mouth daily.    [provider]  EPINEPHrine 0.3 mg/0.3 mL IJ SOAJ injection Inject 0.3 mg into the muscle as needed for anaphylaxis. Follow package instructions as needed for severe allergy or anaphylactic reaction. 10/24/22   Sharman Cheek, MD  Fluticasone-Salmeterol (ADVAIR) 250-50 MCG/DOSE AEPB Inhale 1 puff into the lungs 2 (two) times daily.    [provider]  furosemide (LASIX) 40 MG tablet Take 1.5 tablets (60 mg total) by mouth daily. 07/03/22   Marinus Maw, MD  HYDROcodone-acetaminophen (NORCO/VICODIN)  5-325 MG tablet Take 1 tablet by mouth 3 (three) times daily as needed. 01/30/23   [provider]  ketoconazole (NIZORAL) 2 % cream Apply to both feet and between toes once daily  for 6 weeks. 07/09/22   Freddie Breech, DPM  losartan (COZAAR) 50 MG tablet Take 1 tablet (50 mg total) by mouth daily. 03/29/22   Marrion Coy, MD  nitroGLYCERIN (NITROSTAT) 0.4 MG SL tablet Place 1 tablet (0.4 mg total) under the tongue every 5 (five) minutes as needed for chest pain. 04/28/15   Almond Lint, MD  oxyCODONE-acetaminophen (PERCOCET/ROXICET) 5-325 MG tablet Take 1 tablet by mouth every 6 (six) hours as needed for moderate pain. 03/29/22   Marrion Coy, MD  PHENObarbital (LUMINAL) 100 MG tablet Take 200 mg by mouth at bedtime.     [provider]     Critical care provider statement:   Total critical care time: 33 minutes   Performed by: Karna Christmas MD   Critical care time was exclusive of separately billable procedures and treating other patients.   Critical care was necessary to treat or prevent imminent or life-threatening deterioration.   Critical care was time spent personally by me on the following activities: development of treatment plan with patient and/or surrogate as well as nursing, discussions with consultants, evaluation of patient's response to treatment, examination of patient, obtaining history from patient or surrogate, ordering and performing treatments and interventions, ordering and review of laboratory studies, ordering and review of radiographic studies, pulse oximetry and re-evaluation of patient's condition.    Vida Rigger, M.D.  Pulmonary & Critical Care Medicine

## 2023-05-11 NOTE — Evaluation (Signed)
 Clinical/Bedside Swallow Evaluation Patient Details  Name: Seth Tucker MRN: [id number] Date of Birth: 1965/05/03  Today's Date: 05/11/2023 Time: SLP Start Time (ACUTE ONLY): 1110 SLP Stop Time (ACUTE ONLY): 1140 SLP Time Calculation (min) (ACUTE ONLY): 30 min  Past Medical History:  Past Medical History:  Diagnosis Date   CAD (coronary artery disease)    a. 04/2015 Cath: LM nl, LAD 10ost/p, RI nl, LCX 10p, OM1/2/3 nl, RCA nl. Nl LV fxn.   Chronic heart failure with preserved ejection fraction (HFpEF) (HCC)    a 03/2021 Echo: EF 60-65%, no rwma, mild LVH, nl RV fxn, mild MR, Ao sclerosis.   Depression    Hyperlipidemia    LVH (left ventricular hypertrophy)    Seizure (HCC)    Sleep apnea    SSS (sick sinus syndrome) (HCC)    a. 04/2015 s/p BSX DC PPM (ser #[id number]).   Vitamin D deficiency    Past Surgical History:  Past Surgical History:  Procedure Laterality Date   CARDIAC CATHETERIZATION N/A 05/04/2015   Procedure: Left Heart Cath and Coronary Angiography;  Surgeon: Iran Ouch, MD;  Location: [facility] INVASIVE CV LAB;  Service: Cardiovascular;  Laterality: N/A;   EP IMPLANTABLE DEVICE N/A 05/04/2015   Procedure: Pacemaker Implant;  Surgeon: Marinus Maw, MD;  Location: [facility] INVASIVE CV LAB;  Service: Cardiovascular;  Laterality: N/A;   PPM GENERATOR CHANGEOUT N/A 01/23/2023   Procedure: PPM GENERATOR CHANGEOUT;  Surgeon: Marinus Maw, MD;  Location: [facility] INVASIVE CV LAB;  Service: Cardiovascular;  Laterality: N/A;   HPI:  Per H&P "Family reported the patient to be in his normal state of health last spoken to 2 days ago on 05/06/23 when he was headed to the [facility] for an annual check up. Sister reports he had no complaints. When asked about any issues with seizure activity, she said family was concerned that maybe he was continuing to have seizures. She stated the patient would describe falling asleep in his car and waking up 3 hours later with no memory of what occurred and that  his face/head would be sore as if he had hit the steering wheel somehow. She believes he has only been taking Phenobarbital. She reports he is a current everyday smoker at about a pack a day, but is trying to quit with a 40 + pack year history. She denies ETOH or recreational drug use. A wellness check was called and the patient was found by EMS unresponsive, hypoxic next to the door- somewhat blocking the entrance. Patient then woke up and was lethargic but following commands. EMS reported diffuse wheezing with continued hypoxia. They administered solu medrol and albuterol.  Upon arrival to ED patient was responsive reporting earlier dyspnea, some chest tightness and a new cough. He denied fevers but did endorse BLE edema." CXR 05/08/23: Findings consistent with right basilar pneumonia and small para-pneumonic effusion. CT head wo contrast 05/08/23: No acute intracranial abnormality. Mild chronic microvascular ischemic disease with a few small remote lacunar infarcts about the thalami and right pons.    Assessment / Plan / Recommendation  Clinical Impression  Pt seen for bedside swallow assessment in the s/p extubation. Pt sitting with bed in chair position upon therapist approach. Nasal canula set to 3.5L with O2 saturation maintained at greater than 94 t/o PO trials. Vocal intensity and cough strength both mildly reduced. Pt edentulous and dentures were not present in the room. Pt seen with trials of thin liquids (via straw), puree, and soft  solids. No overt or subtle s/sx pharyngeal dysphagia noted. No change to vocal quality across trials. Vitals stable for duration of trials. Impulsivity noted with completion of serial, large sips of thin liquids via straw- with min increase in WOB noted. Verbal cues provided for single sips with minimal benefit. Oral phase impacted by lack of dentition, though pt demonstrated complete manipulation and clearance of soft solid from oral cavity with min increased time. Pt  denied history of PNA, GERD, or dysphagia.   Based on recent extubation, impulsivity with intake, and current debility, pt is at increased risk of aspiration, therefore recommend aspiration precautions (slow rate, small bites, elevated HOB, and alert for PO intake). Supervision for intake secondary to impulsivity. Recommend Dys 3 (mech soft) and thin liquids. SLP will monitor for continued safety with current diet. MD and RN aware of recommendations. SLP Visit Diagnosis: Dysphagia, oropharyngeal phase (R13.12)    Aspiration Risk  Mild aspiration risk    Diet Recommendation   Dysphagia 3 (mechanical soft);Thin  Medication Administration: Whole meds with liquid    Other  Recommendations Oral Care Recommendations: Oral care BID    Recommendations for follow up therapy are one component of a multi-disciplinary discharge planning process, led by the attending physician.  Recommendations may be updated based on patient status, additional functional criteria and insurance authorization.  Follow up Recommendations Follow physician's recommendations for discharge plan and follow up therapies      Assistance Recommended at Discharge  Supervision and set up for intake   Functional Status Assessment Patient has had a recent decline in their functional status and demonstrates the ability to make significant improvements in function in a reasonable and predictable amount of time.  Frequency and Duration min 2x/week  2 weeks       Prognosis Prognosis for improved oropharyngeal function: Good Barriers to Reach Goals:  (impulsivity)      Swallow Study   General Date of Onset: 05/11/23 HPI: Per H&P "Family reported the patient to be in his normal state of health last spoken to 2 days ago on 05/06/23 when he was headed to the [facility] for an annual check up. Sister reports he had no complaints. When asked about any issues with seizure activity, she said family was concerned that maybe he was  continuing to have seizures. She stated the patient would describe falling asleep in his car and waking up 3 hours later with no memory of what occurred and that his face/head would be sore as if he had hit the steering wheel somehow. She believes he has only been taking Phenobarbital. She reports he is a current everyday smoker at about a pack a day, but is trying to quit with a 40 + pack year history. She denies ETOH or recreational drug use. A wellness check was called and the patient was found by EMS unresponsive, hypoxic next to the door- somewhat blocking the entrance. Patient then woke up and was lethargic but following commands. EMS reported diffuse wheezing with continued hypoxia. They administered solu medrol and albuterol.  Upon arrival to ED patient was responsive reporting earlier dyspnea, some chest tightness and a new cough. He denied fevers but did endorse BLE edema." CXR 05/08/23: Findings consistent with right basilar pneumonia and small para-pneumonic effusion. CT head wo contrast 05/08/23: No acute intracranial abnormality. Mild chronic microvascular ischemic disease with a few small remote lacunar infarcts about the thalami and right pons. Type of Study: Bedside Swallow Evaluation Previous Swallow Assessment: none in chart  Diet Prior to this Study: NPO Temperature Spikes Noted: Yes (101.3; WBC 16.2) Respiratory Status: Nasal cannula (3.5L) History of Recent Intubation: Yes Total duration of intubation (days): 3 days Date extubated: 05/10/23 Behavior/Cognition: Alert;Impulsive;Distractible Oral Cavity Assessment: Within Functional Limits Oral Care Completed by SLP: Yes Oral Cavity - Dentition: Edentulous (dentures not present- RN reporting potential for family to bring them in) Vision: Functional for self-feeding Self-Feeding Abilities: Able to feed self;Needs set up Patient Positioning: Upright in chair Baseline Vocal Quality: Low vocal intensity Volitional Cough: Weak Volitional  Swallow: Able to elicit    Oral/Motor/Sensory Function Overall Oral Motor/Sensory Function: Within functional limits   Ice Chips Ice chips: Not tested   Thin Liquid Thin Liquid: Within functional limits Presentation: Straw;Self Fed (impulsive)    Nectar Thick Nectar Thick Liquid: Not tested   Honey Thick Honey Thick Liquid: Not tested   Puree Puree: Within functional limits   Solid     Solid: Within functional limits (soft solids) Presentation: Spoon Other Comments: mildly prolonged mastication and clearance.     [place] Zakee Deerman Clapp, MS, CCC-SLP Speech Language Pathologist Rehab Services; [facility] [phone] (ascom)   [name] 05/11/2023,1:41 PM

## 2023-05-12 LAB — BASIC METABOLIC PANEL
Anion gap: 9 (ref 5–15)
BUN: 71 mg/dL — ABNORMAL HIGH (ref 6–20)
CO2: 24 mmol/L (ref 22–32)
Calcium: 7.8 mg/dL — ABNORMAL LOW (ref 8.9–10.3)
Chloride: 106 mmol/L (ref 98–111)
Creatinine, Ser: 2.59 mg/dL — ABNORMAL HIGH (ref 0.61–1.24)
GFR, Estimated: 28 mL/min — ABNORMAL LOW (ref >60)
Glucose, Bld: 109 mg/dL — ABNORMAL HIGH (ref 70–99)
Potassium: 3.9 mmol/L (ref 3.5–5.1)
Sodium: 139 mmol/L (ref 135–145)

## 2023-05-12 LAB — GLUCOSE, CAPILLARY
Glucose-Capillary: 115 mg/dL — ABNORMAL HIGH (ref 70–99)
Glucose-Capillary: 123 mg/dL — ABNORMAL HIGH (ref 70–99)
Glucose-Capillary: 158 mg/dL — ABNORMAL HIGH (ref 70–99)
Glucose-Capillary: 167 mg/dL — ABNORMAL HIGH (ref 70–99)
Glucose-Capillary: 79 mg/dL (ref 70–99)
Glucose-Capillary: 92 mg/dL (ref 70–99)
Glucose-Capillary: 95 mg/dL (ref 70–99)

## 2023-05-12 LAB — CBC
HCT: 40.5 % (ref 39.0–52.0)
Hemoglobin: 13.8 g/dL (ref 13.0–17.0)
MCH: 28.4 pg (ref 26.0–34.0)
MCHC: 34.1 g/dL (ref 30.0–36.0)
MCV: 83.3 fL (ref 80.0–100.0)
Platelets: 199 10*3/uL (ref 150–400)
RBC: 4.86 MIL/uL (ref 4.22–5.81)
RDW: 14.7 % (ref 11.5–15.5)
WBC: 15.1 10*3/uL — ABNORMAL HIGH (ref 4.0–10.5)
nRBC: 0.5 % — ABNORMAL HIGH (ref 0.0–0.2)

## 2023-05-12 LAB — GROUP A STREP BY PCR: Group A Strep by PCR: NOT DETECTED

## 2023-05-12 LAB — HEPARIN LEVEL (UNFRACTIONATED)
Heparin Unfractionated: 0.51 [IU]/mL (ref 0.30–0.70)
Heparin Unfractionated: 0.53 [IU]/mL (ref 0.30–0.70)

## 2023-05-12 LAB — C-REACTIVE PROTEIN: CRP: 10.3 mg/dL — ABNORMAL HIGH (ref <1.0)

## 2023-05-12 LAB — TRIGLYCERIDES: Triglycerides: 178 mg/dL — ABNORMAL HIGH (ref <150)

## 2023-05-12 LAB — MAGNESIUM: Magnesium: 2.9 mg/dL — ABNORMAL HIGH (ref 1.7–2.4)

## 2023-05-12 LAB — PHOSPHORUS: Phosphorus: 3.2 mg/dL (ref 2.5–4.6)

## 2023-05-12 MED ORDER — NYSTATIN 100000 UNIT/ML MT SUSP
5.0000 mL | Freq: Four times a day (QID) | OROMUCOSAL | Status: DC
  Administered 2023-05-12 – 2023-05-19 (×13): 500000 [IU] via ORAL
  Filled 2023-05-12 (×35): qty 5

## 2023-05-12 NOTE — Consult Note (Signed)
 PHARMACY CONSULT NOTE - ELECTROLYTES  Pharmacy Consult for Electrolyte Monitoring and Replacement   Recent Labs: Height: 5\' 10"  (177.8 cm) Weight: (!) 138 kg (304 lb 3.8 oz) IBW/kg (Calculated) : 73 Estimated Creatinine Clearance: 43 mL/min (A) (by C-G formula based on SCr of 2.59 mg/dL (H)). Potassium (mmol/L)  Date Value  05/12/2023 3.9   Magnesium (mg/dL)  Date Value  16/11/9602 2.9 (H)   Calcium (mg/dL)  Date Value  54/10/8117 7.8 (L)   Albumin (g/dL)  Date Value  14/78/2956 2.8 (L)   Phosphorus (mg/dL)  Date Value  21/30/8657 3.2   Sodium (mmol/L)  Date Value  05/12/2023 139  01/11/2023 144   Corrected Ca: 8.28 mg/dL  Assessment  Seth Tucker is a 58 y.o. male presenting with hypoxia. PMH significant for seizures, HFpEF, SSS s/p PPM, CAD, PE(on apixaban), HLD. Pharmacy has been consulted to monitor and replace electrolytes.  Goal of Therapy: Electrolytes WNL  Plan:  No electrolyte replacement warranted for today Check BMP, Mg, Phos with AM labs  Thank you for allowing pharmacy to be a part of this patient's care.  Lowella Bandy, PharmD Clinical Pharmacist 05/12/2023 7:45 AM

## 2023-05-12 NOTE — Progress Notes (Signed)
 ANTICOAGULATION CONSULT NOTE  Pharmacy Consult for heparin infusion Indication: pulmonary embolus  Allergies  Allergen Reactions   Bee Venom Anaphylaxis and Swelling   Hornet Venom Shortness Of Breath and Swelling   Influenza Vaccines Other (See Comments)    Seizures    Peanuts [Peanut Oil] Anaphylaxis   Phenytoin Hives and Rash   Valproic Acid Hives and Rash   Lamotrigine Rash    Patient Measurements: Height: 5\' 10"  (177.8 cm) Weight: (!) 138 kg (304 lb 3.8 oz) IBW/kg (Calculated) : 73 Heparin Dosing Weight: 104.7 kg  Vital Signs: Temp: 99.2 F (37.3 C) (03/23 0000) Temp Source: Axillary (03/23 0000) BP: 125/65 (03/23 0300) Pulse Rate: 79 (03/23 0300)  Labs: Recent Labs    05/09/23 2018 05/10/23 0411 05/10/23 1224 05/10/23 1905 05/11/23 1043 05/11/23 2016 05/12/23 0308  HGB  --  13.0  --   --  14.1  --  13.8  HCT  --  39.9  --   --  41.6  --  40.5  PLT  --  114*  --   --  202  --  199  APTT 171* 149* 102*  --   --   --   --   HEPARINUNFRC 0.99* 0.74* 0.69   < > 0.27* 0.20* 0.51  CREATININE  --  3.62*  --   --  3.20*  --  2.59*   < > = values in this interval not displayed.    Estimated Creatinine Clearance: 43 mL/min (A) (by C-G formula based on SCr of 2.59 mg/dL (H)).   Medical History: Past Medical History:  Diagnosis Date   CAD (coronary artery disease)    a. 04/2015 Cath: LM nl, LAD 10ost/p, RI nl, LCX 10p, OM1/2/3 nl, RCA nl. Nl LV fxn.   Chronic heart failure with preserved ejection fraction (HFpEF) (HCC)    a 03/2021 Echo: EF 60-65%, no rwma, mild LVH, nl RV fxn, mild MR, Ao sclerosis.   Depression    Hyperlipidemia    LVH (left ventricular hypertrophy)    Seizure (HCC)    Sleep apnea    SSS (sick sinus syndrome) (HCC)    a. 04/2015 s/p BSX DC PPM (ser #[id number]).   Vitamin D deficiency     Medications:  PTA Meds: Eliquis, last dose unknown  Assessment: Pt is a 58 yo male with hx of PE on Eliquis presenting to ED found unresponsive,  tachypneic, hypoxic at home during wellness check.  Date Time HL Rate/Comment 3/22 1043 0.27 Subtherapeutic 3/22 2016 0.20 Subtherapeutic 3/23 0308 0.51 Therapeutic x 1  Goal of Therapy:  Heparin level 0.3-0.7 units/ml Monitor platelets by anticoagulation protocol: Yes   Plan:  --Continue heparin infusion rate at 1400 units/hr --Recheck next heparin level in 6 hours to confirm --CBC daily while on heparin  Thank you for involving pharmacy in this patient's care.   Otelia Sergeant, PharmD, [facility] 05/12/2023 6:19 AM

## 2023-05-12 NOTE — Progress Notes (Signed)
 Speech Language Pathology Treatment: Dysphagia  Patient Details Name: Seth Tucker MRN: [id number] DOB: 07/29/1965 Today's Date: 05/12/2023 Time: 6578-4696 SLP Time Calculation (min) (ACUTE ONLY): 40 min  Assessment / Plan / Recommendation Clinical Impression  Pt seen for ongoing assessment of swallowing and toleration of current diet, w/ aspiration precautions and Supervision/Support at meals. NSG reported no acute deficits this morning; pt drowsy.  He was alert, verbally responsive and engaged in conversation w/ SLP; min slow in engagement/responses. Pt is on [place] O2 support; wbc wnl. Edentulous.  Pt explained general aspiration precautions and agreed verbally to the need for following them especially sitting upright for all oral intake -- supported behind the back for full upright sitting. Pt fully assisted w/ positioning d/t weakness, distraction. He was able to hold cup to feed himself liquids via Cup; bites of puree fed by SLP. No overt clinical s/s of aspiration were noted w/ any consistency; respiratory status remained calm and unlabored, vocal quality clear b/t trials. O2 sats 98%. Oral phase appeared grossly [facility] for bolus management and timely A-P transfer for swallowing of trials given; oral clearing achieved w/ all consistencies. NSG denied any deficits in swallowing as well.   Pt appears at reduced risk for aspriation when following general aspiration precautions w/ a modified diet -- No Dentures present yet. Recommend continue current diet for ease of foods w/ gravies added to moisten foods; Thin liquids. Recommend general aspiration precautions; Pills Whole in Puree as able; tray setup and positioning assistance for meals. Feeding Support at meals -- give po's only when fully awake/alert. ST services will f/u w/ trials to upgrade while admitted when Dentures present. NSG updated. Precautions posted at bedside, chart.      HPI HPI: Per H&P "Family reported the patient to be in his  normal state of health last spoken to 2 days ago on 05/06/23 when he was headed to the [facility] for an annual check up. Sister reports he had no complaints. When asked about any issues with seizure activity, she said family was concerned that maybe he was continuing to have seizures. She stated the patient would describe falling asleep in his car and waking up 3 hours later with no memory of what occurred and that his face/head would be sore as if he had hit the steering wheel somehow. She believes he has only been taking Phenobarbital. She reports he is a current everyday smoker at about a pack a day, but is trying to quit with a 40 + pack year history. She denies ETOH or recreational drug use. A wellness check was called and the patient was found by EMS unresponsive, hypoxic next to the door- somewhat blocking the entrance. Patient then woke up and was lethargic but following commands. EMS reported diffuse wheezing with continued hypoxia. They administered solu medrol and albuterol.  Upon arrival to ED patient was responsive reporting earlier dyspnea, some chest tightness and a new cough. He denied fevers but did endorse BLE edema." CXR 05/08/23: Findings consistent with right basilar pneumonia and small para-pneumonic effusion. CT head wo contrast 05/08/23: No acute intracranial abnormality. Mild chronic microvascular ischemic disease with a few small remote lacunar infarcts about the thalami and right pons.      SLP Plan  Continue with current plan of care      Recommendations for follow up therapy are one component of a multi-disciplinary discharge planning process, led by the attending physician.  Recommendations may be updated based on patient status, additional functional  criteria and insurance authorization.    Recommendations  Diet recommendations: Dysphagia 3 (mechanical soft);Thin liquid (meats chopeed well, gravies) Liquids provided via: Cup Medication Administration: Whole meds with  liquid (vs Whole in PUREE) Supervision: Patient able to self feed;Staff to assist with self feeding;Full supervision/cueing for compensatory strategies Compensations: Minimize environmental distractions;Slow rate;Small sips/bites;Lingual sweep for clearance of pocketing;Follow solids with liquid Postural Changes and/or Swallow Maneuvers: Out of bed for meals;Seated upright 90 degrees;Upright 30-60 min after meal                 (Dietician f/u) Oral care BID;Staff/trained caregiver to provide oral care   Frequent or constant Supervision/Assistance Dysphagia, oropharyngeal phase (R13.12)     Continue with current plan of care        Jerilynn Som, MS, CCC-SLP Speech Language Pathologist Rehab Services; [facility] [phone] (ascom) Morton Simson  05/12/2023, 1:17 PM

## 2023-05-12 NOTE — Progress Notes (Signed)
 ANTICOAGULATION CONSULT NOTE  Pharmacy Consult for heparin infusion Indication: pulmonary embolus  Allergies  Allergen Reactions   Bee Venom Anaphylaxis and Swelling   Hornet Venom Shortness Of Breath and Swelling   Influenza Vaccines Other (See Comments)    Seizures    Peanuts [Peanut Oil] Anaphylaxis   Phenytoin Hives and Rash   Valproic Acid Hives and Rash   Lamotrigine Rash    Patient Measurements: Height: 5\' 10"  (177.8 cm) Weight: (!) 138 kg (304 lb 3.8 oz) IBW/kg (Calculated) : 73 Heparin Dosing Weight: 104.7 kg  Vital Signs: Temp: 100.6 F (38.1 C) (03/23 0400) Temp Source: Axillary (03/23 0400) BP: 161/82 (03/23 0600) Pulse Rate: 81 (03/23 0600)  Labs: Recent Labs    05/09/23 2018 05/09/23 2018 05/10/23 0411 05/10/23 1224 05/10/23 1905 05/11/23 1043 05/11/23 2016 05/12/23 0308  HGB  --    < > 13.0  --   --  14.1  --  13.8  HCT  --   --  39.9  --   --  41.6  --  40.5  PLT  --   --  114*  --   --  202  --  199  APTT 171*  --  149* 102*  --   --   --   --   HEPARINUNFRC 0.99*  --  0.74* 0.69   < > 0.27* 0.20* 0.51  CREATININE  --   --  3.62*  --   --  3.20*  --  2.59*   < > = values in this interval not displayed.    Estimated Creatinine Clearance: 43 mL/min (A) (by C-G formula based on SCr of 2.59 mg/dL (H)).   Medical History: Past Medical History:  Diagnosis Date   CAD (coronary artery disease)    a. 04/2015 Cath: LM nl, LAD 10ost/p, RI nl, LCX 10p, OM1/2/3 nl, RCA nl. Nl LV fxn.   Chronic heart failure with preserved ejection fraction (HFpEF) (HCC)    a 03/2021 Echo: EF 60-65%, no rwma, mild LVH, nl RV fxn, mild MR, Ao sclerosis.   Depression    Hyperlipidemia    LVH (left ventricular hypertrophy)    Seizure (HCC)    Sleep apnea    SSS (sick sinus syndrome) (HCC)    a. 04/2015 s/p BSX DC PPM (ser #[id number]).   Vitamin D deficiency     Medications:  PTA Meds: Eliquis, last dose unknown  Assessment: Pt is a 58 yo male with hx of PE on  Eliquis presenting to ED found unresponsive, tachypneic, hypoxic at home during wellness check.  Goal of Therapy:  Heparin level 0.3-0.7 units/ml Monitor platelets by anticoagulation protocol: Yes   Plan:  heparin level therapeutic x 2 --continue heparin infusion at 1400 units/hr --Check next heparin level in am 03/24 and once daily while therapeutic --CBC daily while on heparin  Thank you for involving pharmacy in this patient's care.   Burnis Medin, PharmD, BCPS Clinical Pharmacist 05/12/2023 8:26 AM

## 2023-05-12 NOTE — Progress Notes (Signed)
 [facility]  PROGRESS NOTE   Subjective:   Seen at bedside.  Family is in attendance.  Urine output has improved.  Overall: Stable  Objective:  Vital signs: Blood pressure (!) 147/80, pulse 88, temperature 98.4 F (36.9 C), temperature source Oral, resp. rate (!) 25, height 5\' 10"  (1.778 m), weight (!) 138 kg, SpO2 99%.  Intake/Output Summary (Last 24 hours) at 05/12/2023 1546 Last data filed at 05/12/2023 1500 Gross per 24 hour  Intake 1434.32 ml  Output 3525 ml  Net -2090.68 ml   Filed Weights   05/10/23 0500 05/11/23 0500 05/12/23 0500  Weight: (!) 142.5 kg (!) 138.1 kg (!) 138 kg     Physical Exam: General:  No acute distress  Head:  Normocephalic, atraumatic. Moist oral mucosal membranes  Eyes:  Anicteric  Neck:  Supple  Lungs:   Clear to auscultation, normal effort  Heart:  S1S2 no rubs  Abdomen:   Soft, nontender, bowel sounds present  Extremities:  peripheral edema.  Neurologic:  Awake, alert, following commands  Skin:  No lesions  Access:     Basic Metabolic Panel: Recent Labs  Lab 05/09/23 0632 05/09/23 1322 05/09/23 2018 05/10/23 0411 05/11/23 1042 05/11/23 1043 05/12/23 0308  NA 135 136  --  141  --  138 139  K 5.9* 5.5* 4.8 4.4  --  3.6 3.9  CL 105 106  --  107  --  106 106  CO2 21* 21*  --  25  --  24 24  GLUCOSE 155* 157*  --  208*  --  113* 109*  BUN 64* 69*  --  80*  --  78* 71*  CREATININE 2.84* 3.41*  --  3.62*  --  3.20* 2.59*  CALCIUM 7.2* 7.1*  --  7.4*  --  8.0* 7.8*  MG 2.7*  --   --  2.9* 2.8*  --  2.9*  PHOS 5.1*  --   --  2.8 1.5*  --  3.2   GFR: Estimated Creatinine Clearance: 43 mL/min (A) (by C-G formula based on SCr of 2.59 mg/dL (H)).  Liver Function Tests: Recent Labs  Lab 05/08/23 1828 05/09/23 0632 05/11/23 1043  AST 97* 87* 54*  ALT 55* 47* 35  ALKPHOS 62 58 56  BILITOT 0.8 1.5* 0.9  PROT 8.0 6.7 7.0  ALBUMIN 3.5 2.9* 2.8*   No results for input(s): "LIPASE", "AMYLASE" in the last 168  hours. Recent Labs  Lab 05/09/23 0632  AMMONIA 92*    CBC: Recent Labs  Lab 05/08/23 1828 05/09/23 0632 05/10/23 0411 05/11/23 1043 05/12/23 0308  WBC 14.6* 13.9* 14.6* 16.2* 15.1*  NEUTROABS 11.5*  --   --  10.2*  --   HGB 16.1 15.1 13.0 14.1 13.8  HCT 50.6 47.9 39.9 41.6 40.5  MCV 90.2 90.4 86.9 82.4 83.3  PLT 128* 122* 114* 202 199     HbA1C: Hgb A1c MFr Bld  Date/Time Value Ref Range Status  05/09/2023 08:18 PM 6.6 (H) 4.8 - 5.6 % Final    Comment:    (NOTE) Pre diabetes:          5.7%-6.4%  Diabetes:              >6.4%  Glycemic control for   <7.0% adults with diabetes     Urinalysis: No results for input(s): "COLORURINE", "LABSPEC", "PHURINE", "GLUCOSEU", "HGBUR", "BILIRUBINUR", "KETONESUR", "PROTEINUR", "UROBILINOGEN", "NITRITE", "LEUKOCYTESUR" in the last 72 hours.  Invalid input(s): "APPERANCEUR"    Imaging:  No results found.   Medications:    azithromycin Stopped (05/11/23 1929)   heparin 1,400 Units/hr (05/12/23 1500)    Chlorhexidine Gluconate Cloth  6 each Topical Daily   docusate  100 mg Oral BID   famotidine  20 mg Oral BID   feeding supplement  237 mL Oral BID BM   insulin aspart  0-15 Units Subcutaneous Q4H   ipratropium-albuterol  3 mL Nebulization Q6H   lactulose  30 g Oral TID   methylPREDNISolone (SOLU-MEDROL) injection  40 mg Intravenous Q24H   mupirocin ointment  1 Application Nasal BID   PHENObarbital  200 mg Oral QHS   polyethylene glycol  17 g Oral Daily   QUEtiapine  25 mg Oral QHS    Assessment/ Plan:     58 y.o. male with a PMHx of obese male with history of seizure disorder, hypertension, congestive heart failure, status post pacemaker placement, obstructive sleep apnea, COPD was admitted with history of unresponsive episode.  He was initially intubated and sedated.  He was found to have pneumonia and was treated with antibiotics.    #1: Acute kidney injury: Acute kidney injury most likely secondary to hemodynamic  compromise due to lack of perfusion versus ATN secondary to sepsis.  Urine output has been improving.  Will continue to monitor closely.  CT scan did not show any evidence of obstruction.     #2: Congestive heart failure: Continue diuretics as needed at this time.   #3: Sepsis/pneumonia: He is presently on azithromycin.   Continue supportive care will monitor closely. Case discussed with ICU attending and family at bedside..    LOS: 4 Lorain Childes, MD [facility] 3/23/20253:46 PM

## 2023-05-12 NOTE — Progress Notes (Signed)
 NAME:  Seth Tucker, MRN:  [id number], DOB:  [birth year], LOS: 4 ADMISSION DATE:  05/08/2023, CONSULTATION DATE:  05/08/23 REFERRING MD:  Dr. Jodie Echevaria, CHIEF COMPLAINT:  Unresponsive & SOB   History of Present Illness:  58 yo M presenting to [facility] ED from home via EMS for evaluation after being found unresponsive, hypoxic with a pulse.   History provided per chart review and sister telephone conference, as patient is unable to participate in interview at this time. Family reported the patient to be in his normal state of health last spoken to 2 days ago on 05/06/23 when he was headed to the [facility] for an annual check up. Sister reports he had no complaints. When asked about any issues with seizure activity, she said family was concerned that maybe he was continuing to have seizures. She stated the patient would describe falling asleep in his car and waking up 3 hours later with no memory of what occurred and that his face/head would be sore as if he had hit the steering wheel somehow. She believes he has only been taking Phenobarbital. She reports he is a current everyday smoker at about a pack a day, but is trying to quit with a 40 + pack year history. She denies ETOH or recreational drug use.  05/09/23-patietn + for adenovirus on RVP.  Remains hypoxemic and acidemic on repeat blood gas eval.  He's on empiric abx and steroids.  05/10/23- patient weaning from [facility], on propofol weaning protocol for awakening and SBT. Blood culture with contaminant.  S/p extubation today. 05/11/23- patient awake alert , cursing at staff , asking for food wants to leave.  He does have component of altered mentation which may be metabolic in context of hyperammonimea and AKI.  Nephrology consultation is in process. Optimizing for TRH downgrade.  05/12/23- Patient tolerated BIPAP well overnight, mentation is lucid and oriented x 3.  Optimized for TRH transferring to PCU today.  Pertinent  Medical History  Seizure  disorder HFpEF SSS s/p PPM placement Pulmonary Embolus on Eliquis CAD HLD OSA  Significant Hospital Events: Including procedures, antibiotic start and stop dates in addition to other pertinent events   05/08/23: Admit to ICU due to acute hypoxic/ hypercapnic respiratory failure secondary to right basilar pneumonia and suspected small para-pnemonic effusion requiring emergent intubation and mechanical ventilatory support.  Interim History / Subjective:  Patient sedated after intubation, RASS: -5 on mechanical ventilatory support.   Objective   Blood pressure (!) 161/82, pulse 81, temperature (!) 100.6 F (38.1 C), temperature source Axillary, resp. rate (!) 26, height 5\' 10"  (1.778 m), weight (!) 138 kg, SpO2 97%.    FiO2 (%):  [30 %-40 %] 30 %   Intake/Output Summary (Last 24 hours) at 05/12/2023 7829 Last data filed at 05/12/2023 0600 Gross per 24 hour  Intake 1309.25 ml  Output 2650 ml  Net -1340.75 ml   Filed Weights   05/10/23 0500 05/11/23 0500 05/12/23 0500  Weight: (!) 142.5 kg (!) 138.1 kg (!) 138 kg    Examination: General: Adult male, NAD obese  HEENT: MM pink/moist, red schlera, atraumatic, neck supple Neuro: no FND grossly  CV: s1s2 RRR, NSR on monitor, no r/m/g Pulm: Regular, non labored mild rhonchi diminished-BLL GI: soft, rounded, bs x 4 GU: foley in place with clear yellow urine Skin:  no rashes/lesions noted Extremities: warm/dry, pulses + 2 R/P, +1 edema noted BLE  IMAGING   COMPARISON:  Chest radiographs earlier today.  Chest CT 03/28/2022  FINDINGS: CT CHEST FINDINGS   Cardiovascular: The heart is upper normal in size. Pacemaker wires in the right atrium and ventricle. Aortic atherosclerosis without aneurysm. Dilated main pulmonary artery at 4 cm. No pericardial effusion.   Mediastinum/Nodes: Enteric tube decompresses the esophagus. Endotracheal tube tip above the carina. Mediastinal adenopathy including a 16 mm anterior paratracheal node,  series 2, image 31. Assessment for hilar adenopathy is limited in the absence of IV contrast. No suspicious thyroid nodule.   Lungs/Pleura: Extends multifocal ground-glass and consolidative opacities throughout both lungs. There is both lung and all lobar involvement. Greatest airspace disease is in the right middle lobe and lingula with air bronchograms. No cavitary components. There is moderate bronchial thickening. Trace right pleural effusion.   Musculoskeletal: There are no acute or suspicious osseous abnormalities. Left-sided pacemaker.   CT ABDOMEN PELVIS FINDINGS   Hepatobiliary: Hepatic steatosis. No evidence of focal hepatic abnormality on this unenhanced exam. Gallbladder physiologically distended, no calcified stone. No biliary dilatation.   Pancreas: No ductal dilatation or inflammation.   Spleen: Normal in size without focal abnormality.   Adrenals/Urinary Tract: Normal adrenal glands. 4 mm nonobstructing stone in the right kidney. No hydronephrosis or renal inflammation. Decompressed ureters. Urinary bladder is decompressed by Foley catheter.   Stomach/Bowel: Enteric tube tip in the stomach. There is no bowel obstruction or inflammation. Normal appendix.   Vascular/Lymphatic: Aortic atherosclerosis. No aortic aneurysm. Shotty periportal and retroperitoneal nodes, likely reactive.   Reproductive: Prostate is unremarkable.   Other: No ascites or free air. Small fat containing umbilical hernia.   Musculoskeletal: There are no acute or suspicious osseous abnormalities. Degenerative change of both hips in the pubic symphysis. Transitional lumbosacral anatomy with sacralization of L5.   IMPRESSION: 1. Extensive multifocal ground-glass and consolidative opacities throughout both lungs, greatest in the right middle lobe and lingula. Findings are consistent with multifocal pneumonia. 2. Trace right pleural effusion. 3. Mediastinal adenopathy, likely  reactive. 4. No acute abnormality in the abdomen/pelvis. 5. Hepatic steatosis. 6. Nonobstructing right renal stone.   Aortic Atherosclerosis (ICD10-I70.0).     Electronically Signed   By: Narda Rutherford M.D.   On: 05/08/2023 21:35  Assessment & Plan:   Acute Hypoxic / Hypercapnic Respiratory Failure secondary to multifocal pneumonia & AECOPD in the setting of acute encephalopathy and suspected aspiration-ADENOVIRUS INFECTION PMHx: OSA on CPAP - F/u cultures, trend PCT-BLOOD CX contaminant staph epi - Continue CAP/Aspiration Pna coverage: unasyn, azithromycin & vancomycin  Acute Kidney Injury -KDGIO 3 - due to septic shock Baseline Cr: 0.79, Cr on admission: 2.28 - Strict I/O's: alert provider if UOP < 0.5 mL/kg/hr - gentle IVF hydration  - Daily BMP, replace electrolytes PRN - Avoid nephrotoxic agents as able, ensure adequate renal perfusion - consider renal US PRN -nephrology consult  Septic shock - present on admission - due to pneumonia- RESOLVED  -now off pressors    Chronic HFpEF without exacerbation- RESOLVED  Elevated Troponin but flat suspect secondary to demand ischemia HTN PMHx: CAD, SSS s/p PPM, HLD - Echocardiogram  - hold outpatient regimen: apixaban, losartan, carvedilol- consider restarting as patient stabilizes - continuous cardiac monitoring  Acute Encephalopathy due to hypercapnia - RESOLVED  Chronic Seizure Disorder Concern for seizure activity leading to unresponsive episode. However lactic so far WNL.  - continue outpatient phenobarbital, monitor LFT's closely - phenobarbital level pending - avoid sedating medications as tolerated - consult neurology depending on work up results - EEG ordered in the AM  Chronic Anticoagulation due to history of  PE -start heparin drip - f/u US BLE to r/o DVT, unable to scan for PE due to AKI  Transaminitis  - Trend hepatic function - Consider RUQ [place] PRN - avoid hepatotoxic agents  Best Practice (right  click and "Reselect all SmartList Selections" daily)  Diet/type: NPO w/ meds via tube DVT prophylaxis systemic heparin Pressure ulcer(s): N/A GI prophylaxis: H2B Lines: N/A Foley:  Yes, and it is still needed Code Status:  full code Last date of multidisciplinary goals of care discussion [05/08/23] Sister updated via telephone conference, plan of care discussed. All questions and concerns answered at this time. Labs   CBC: Recent Labs  Lab 05/08/23 1828 05/09/23 0632 05/10/23 0411 05/11/23 1043 05/12/23 0308  WBC 14.6* 13.9* 14.6* 16.2* 15.1*  NEUTROABS 11.5*  --   --  10.2*  --   HGB 16.1 15.1 13.0 14.1 13.8  HCT 50.6 47.9 39.9 41.6 40.5  MCV 90.2 90.4 86.9 82.4 83.3  PLT 128* 122* 114* 202 199    Basic Metabolic Panel: Recent Labs  Lab 05/09/23 0632 05/09/23 1322 05/09/23 2018 05/10/23 0411 05/11/23 1042 05/11/23 1043 05/12/23 0308  NA 135 136  --  141  --  138 139  K 5.9* 5.5* 4.8 4.4  --  3.6 3.9  CL 105 106  --  107  --  106 106  CO2 21* 21*  --  25  --  24 24  GLUCOSE 155* 157*  --  208*  --  113* 109*  BUN 64* 69*  --  80*  --  78* 71*  CREATININE 2.84* 3.41*  --  3.62*  --  3.20* 2.59*  CALCIUM 7.2* 7.1*  --  7.4*  --  8.0* 7.8*  MG 2.7*  --   --  2.9* 2.8*  --  2.9*  PHOS 5.1*  --   --  2.8 1.5*  --  3.2   GFR: Estimated Creatinine Clearance: 43 mL/min (A) (by C-G formula based on SCr of 2.59 mg/dL (H)). Recent Labs  Lab 05/08/23 1828 05/08/23 1950 05/08/23 2138 05/09/23 0632 05/10/23 0411 05/11/23 1043 05/12/23 0308  PROCALCITON  --   --  4.09 6.68 6.91  --   --   WBC 14.6*  --   --  13.9* 14.6* 16.2* 15.1*  LATICACIDVEN 1.6 1.9  --   --   --   --   --     Liver Function Tests: Recent Labs  Lab 05/08/23 1828 05/09/23 0632 05/11/23 1043  AST 97* 87* 54*  ALT 55* 47* 35  ALKPHOS 62 58 56  BILITOT 0.8 1.5* 0.9  PROT 8.0 6.7 7.0  ALBUMIN 3.5 2.9* 2.8*   No results for input(s): "LIPASE", "AMYLASE" in the last 168 hours. Recent Labs   Lab 05/09/23 0632  AMMONIA 92*    ABG    Component Value Date/Time   HCO3 21.6 05/09/2023 1323   ACIDBASEDEF 5.2 (H) 05/09/2023 1323   O2SAT 99.9 05/09/2023 1323     Coagulation Profile: Recent Labs  Lab 05/08/23 1828  INR 1.2    Cardiac Enzymes: No results for input(s): "CKTOTAL", "CKMB", "CKMBINDEX", "TROPONINI" in the last 168 hours.  HbA1C: Hgb A1c MFr Bld  Date/Time Value Ref Range Status  05/09/2023 08:18 PM 6.6 (H) 4.8 - 5.6 % Final    Comment:    (NOTE) Pre diabetes:          5.7%-6.4%  Diabetes:              >  6.4%  Glycemic control for   <7.0% adults with diabetes     CBG: Recent Labs  Lab 05/11/23 1106 05/11/23 1606 05/11/23 1943 05/12/23 0013 05/12/23 0424  GLUCAP 105* 130* 100* 95 92    Review of Systems:   UTA- patient intubated and unable to participate in interview at this time.  Past Medical History:  He,  has a past medical history of CAD (coronary artery disease), Chronic heart failure with preserved ejection fraction (HFpEF) (HCC), Depression, Hyperlipidemia, LVH (left ventricular hypertrophy), Seizure (HCC), Sleep apnea, SSS (sick sinus syndrome) (HCC), and Vitamin D deficiency.   Surgical History:   Past Surgical History:  Procedure Laterality Date   CARDIAC CATHETERIZATION N/A 05/04/2015   Procedure: Left Heart Cath and Coronary Angiography;  Surgeon: Iran Ouch, MD;  Location: [facility] INVASIVE CV LAB;  Service: Cardiovascular;  Laterality: N/A;   EP IMPLANTABLE DEVICE N/A 05/04/2015   Procedure: Pacemaker Implant;  Surgeon: Marinus Maw, MD;  Location: [facility] INVASIVE CV LAB;  Service: Cardiovascular;  Laterality: N/A;   PPM GENERATOR CHANGEOUT N/A 01/23/2023   Procedure: PPM GENERATOR CHANGEOUT;  Surgeon: Marinus Maw, MD;  Location: [facility] INVASIVE CV LAB;  Service: Cardiovascular;  Laterality: N/A;     Social History:   reports that he has been smoking cigarettes. He has a 10 pack-year smoking history. He has never used  smokeless tobacco. He reports that he does not drink alcohol and does not use drugs.   Family History:  His family history includes Diabetes in an other family member; Emphysema in his mother; Heart disease in an other family member; Hypertension in his brother and mother.   Allergies Allergies  Allergen Reactions   Bee Venom Anaphylaxis and Swelling   Hornet Venom Shortness Of Breath and Swelling   Influenza Vaccines Other (See Comments)    Seizures    Peanuts [Peanut Oil] Anaphylaxis   Phenytoin Hives and Rash   Valproic Acid Hives and Rash   Lamotrigine Rash     Home Medications  Prior to Admission medications   Medication Sig Start Date End Date Taking? Authorizing Provider  albuterol (PROVENTIL HFA;VENTOLIN HFA) 108 (90 Base) MCG/ACT inhaler Inhale 2 puffs into the lungs every 4 (four) hours as needed for wheezing or shortness of breath.    [provider]  amoxicillin-clavulanate (AUGMENTIN) 875-125 MG tablet Take 1 tablet by mouth 2 (two) times daily. 03/27/22   [provider]  apixaban (ELIQUIS) 5 MG TABS tablet Take 1 tablet (5 mg total) by mouth 2 (two) times daily. 07/03/22   Marinus Maw, MD  aspirin EC 81 MG EC tablet Take 1 tablet (81 mg total) by mouth daily. 05/05/15   Sheilah Pigeon, PA-C  atorvastatin (LIPITOR) 40 MG tablet Take 40 mg by mouth daily. 02/25/15   [provider]  carvedilol (COREG) 6.25 MG tablet Take 1 tablet (6.25 mg total) by mouth 2 (two) times daily with a meal. 03/29/22   Marrion Coy, MD  cholecalciferol (VITAMIN D) 1000 units tablet Take 1,000 Units by mouth daily.    [provider]  EPINEPHrine 0.3 mg/0.3 mL IJ SOAJ injection Inject 0.3 mg into the muscle as needed for anaphylaxis. Follow package instructions as needed for severe allergy or anaphylactic reaction. 10/24/22   Sharman Cheek, MD  Fluticasone-Salmeterol (ADVAIR) 250-50 MCG/DOSE AEPB Inhale 1 puff into the lungs 2 (two) times daily.    [provider]  furosemide (LASIX) 40 MG tablet Take 1.5 tablets (60 mg  total) by mouth daily. 07/03/22   Marinus Maw, MD  HYDROcodone-acetaminophen (NORCO/VICODIN) 5-325 MG tablet Take 1 tablet by mouth 3 (three) times daily as needed. 01/30/23   [provider]  ketoconazole (NIZORAL) 2 % cream Apply to both feet and between toes once daily for 6 weeks. 07/09/22   Freddie Breech, DPM  losartan (COZAAR) 50 MG tablet Take 1 tablet (50 mg total) by mouth daily. 03/29/22   Marrion Coy, MD  nitroGLYCERIN (NITROSTAT) 0.4 MG SL tablet Place 1 tablet (0.4 mg total) under the tongue every 5 (five) minutes as needed for chest pain. 04/28/15   Almond Lint, MD  oxyCODONE-acetaminophen (PERCOCET/ROXICET) 5-325 MG tablet Take 1 tablet by mouth every 6 (six) hours as needed for moderate pain. 03/29/22   Marrion Coy, MD  PHENObarbital (LUMINAL) 100 MG tablet Take 200 mg by mouth at bedtime.     [provider]     Critical care provider statement:   Total critical care time: 33 minutes   Performed by: Karna Christmas MD   Critical care time was exclusive of separately billable procedures and treating other patients.   Critical care was necessary to treat or prevent imminent or life-threatening deterioration.   Critical care was time spent personally by me on the following activities: development of treatment plan with patient and/or surrogate as well as nursing, discussions with consultants, evaluation of patient's response to treatment, examination of patient, obtaining history from patient or surrogate, ordering and performing treatments and interventions, ordering and review of laboratory studies, ordering and review of radiographic studies, pulse oximetry and re-evaluation of patient's condition.    Vida Rigger, M.D.  Pulmonary & Critical Care Medicine

## 2023-05-13 DIAGNOSIS — J9601 Acute respiratory failure with hypoxia: Secondary | ICD-10-CM | POA: Diagnosis not present

## 2023-05-13 LAB — CULTURE, BLOOD (ROUTINE X 2)
Culture: NO GROWTH
Special Requests: ADEQUATE

## 2023-05-13 LAB — GLUCOSE, CAPILLARY
Glucose-Capillary: 99 mg/dL (ref 70–99)
Glucose-Capillary: 115 mg/dL — ABNORMAL HIGH (ref 70–99)
Glucose-Capillary: 148 mg/dL — ABNORMAL HIGH (ref 70–99)
Glucose-Capillary: 112 mg/dL — ABNORMAL HIGH (ref 70–99)
Glucose-Capillary: 109 mg/dL — ABNORMAL HIGH (ref 70–99)
Glucose-Capillary: 94 mg/dL (ref 70–99)

## 2023-05-13 LAB — BASIC METABOLIC PANEL
Anion gap: 10 (ref 5–15)
BUN: 63 mg/dL — ABNORMAL HIGH (ref 6–20)
CO2: 25 mmol/L (ref 22–32)
Calcium: 8.2 mg/dL — ABNORMAL LOW (ref 8.9–10.3)
Chloride: 105 mmol/L (ref 98–111)
Creatinine, Ser: 2.03 mg/dL — ABNORMAL HIGH (ref 0.61–1.24)
GFR, Estimated: 37 mL/min — ABNORMAL LOW (ref >60)
Glucose, Bld: 107 mg/dL — ABNORMAL HIGH (ref 70–99)
Potassium: 4.1 mmol/L (ref 3.5–5.1)
Sodium: 140 mmol/L (ref 135–145)

## 2023-05-13 LAB — MAGNESIUM: Magnesium: 2.9 mg/dL — ABNORMAL HIGH (ref 1.7–2.4)

## 2023-05-13 LAB — CBC
HCT: 39 % (ref 39.0–52.0)
Hemoglobin: 12.8 g/dL — ABNORMAL LOW (ref 13.0–17.0)
MCH: 28 pg (ref 26.0–34.0)
MCHC: 32.8 g/dL (ref 30.0–36.0)
MCV: 85.3 fL (ref 80.0–100.0)
Platelets: 252 10*3/uL (ref 150–400)
RBC: 4.57 MIL/uL (ref 4.22–5.81)
RDW: 14.6 % (ref 11.5–15.5)
WBC: 19.7 10*3/uL — ABNORMAL HIGH (ref 4.0–10.5)
nRBC: 0.1 % (ref 0.0–0.2)

## 2023-05-13 LAB — HEPARIN LEVEL (UNFRACTIONATED): Heparin Unfractionated: 0.42 [IU]/mL (ref 0.30–0.70)

## 2023-05-13 LAB — PHOSPHORUS: Phosphorus: 3.2 mg/dL (ref 2.5–4.6)

## 2023-05-13 MED ORDER — ENSURE ENLIVE PO LIQD
237.0000 mL | Freq: Three times a day (TID) | ORAL | Status: DC
Start: 2023-05-13 — End: 2023-05-17
  Administered 2023-05-13 – 2023-05-15 (×6): 237 mL via ORAL

## 2023-05-13 MED ORDER — APIXABAN 5 MG PO TABS
5.0000 mg | ORAL_TABLET | Freq: Two times a day (BID) | ORAL | Status: DC
Start: 2023-05-13 — End: 2023-05-20
  Administered 2023-05-13 – 2023-05-20 (×14): 5 mg via ORAL
  Filled 2023-05-13 (×14): qty 1

## 2023-05-13 MED ORDER — IPRATROPIUM-ALBUTEROL 0.5-2.5 (3) MG/3ML IN SOLN
3.0000 mL | Freq: Three times a day (TID) | RESPIRATORY_TRACT | Status: DC
  Administered 2023-05-13 – 2023-05-20 (×19): 3 mL via RESPIRATORY_TRACT
  Filled 2023-05-13 (×21): qty 3

## 2023-05-13 MED ORDER — ADULT MULTIVITAMIN W/MINERALS CH
1.0000 | ORAL_TABLET | Freq: Every day | ORAL | Status: DC
  Administered 2023-05-14 – 2023-05-20 (×7): 1 via ORAL
  Filled 2023-05-13 (×7): qty 1

## 2023-05-13 MED ORDER — DM-GUAIFENESIN ER 30-600 MG PO TB12
1.0000 | ORAL_TABLET | Freq: Two times a day (BID) | ORAL | Status: DC
  Administered 2023-05-13 – 2023-05-20 (×13): 1 via ORAL
  Filled 2023-05-13 (×16): qty 1

## 2023-05-13 NOTE — Progress Notes (Signed)
 Physical Therapy Treatment Patient Details Name: Seth Tucker MRN: [id number] DOB: 09-17-1965 Today's Date: 05/13/2023   History of Present Illness Pt is a 58 year old male admitted to ICU due to acute hypoxic/ hypercapnic respiratory failure secondary to possible seizure, right basilar pneumonia and suspected small para-pnemonic effusion  after being found unresponsive, requiring emergent intubation and mechanical ventilatory support, extubated 05/10/23;     PMH significant for Seizure disorder, HFpEF  SSS s/p PPM placement, Pulmonary Embolus on Eliquis, CAD, HLD, OSA    PT Comments  Patient is agreeable to PT session. He is motivated to participate. The patient was able to stand 2 bouts with increased independence with each stand. He needed facilitation for anterior weight shifting on the first stand with physical assistance required, progressing to CGA for second stand. Pre-gait activity performed with focus on upright standing posture, anterior weight shifting, and taking side steps with assistance. Sp02 briefly down to 87% on 4 L02 and increases to the 90's with seated rest break and cues for breathing techniques. Recommend rehabilitation > 3 hours/day after this hospital stay.    If plan is discharge home, recommend the following: Two people to help with walking and/or transfers;Two people to help with bathing/dressing/bathroom   Can travel by private vehicle        Equipment Recommendations  Rolling walker (2 wheels)    Recommendations for Other Services Rehab consult     Precautions / Restrictions Precautions Precautions: Fall Recall of Precautions/Restrictions: Impaired Restrictions Weight Bearing Restrictions Per Provider Order: No     Mobility  Bed Mobility Overal bed mobility: Needs Assistance Bed Mobility: Supine to Sit, Sit to Supine     Supine to sit: Mod assist, +2 for physical assistance Sit to supine: Max assist   General bed mobility comments: verbal cues  for task initiation and sequencing. increased time and effort required    Transfers Overall transfer level: Needs assistance Equipment used: Rolling walker (2 wheels) Transfers: Sit to/from Stand Sit to Stand: Min assist, Mod assist, +2 physical assistance           General transfer comment: verbal cues for technique for standing. increased assistance required for the first stand. CGA +2 for the second stand. cues for hand placement with each stand    Ambulation/Gait             Pre-gait activities: with Min A+2 person, patient was able to take sevearl side steps to the right. activity tolerance limited by fatigue. no knee buckling with standing.     Stairs             Wheelchair Mobility     Tilt Bed    Modified Rankin (Stroke Patients Only)       Balance Overall balance assessment: Needs assistance Sitting-balance support: No upper extremity supported, Feet supported Sitting balance-Leahy Scale: Fair   Postural control: Posterior lean Standing balance support: Bilateral upper extremity supported Standing balance-Leahy Scale: Poor Standing balance comment: standing balance poor initially with posterior lean, progressing to fair. faciliation for anterior weight shifting with initial stand                            Communication Communication Communication: No apparent difficulties  Cognition Arousal: Alert Behavior During Therapy: WFL for tasks assessed/performed   PT - Cognitive impairments: No apparent impairments  Following commands: Impaired Following commands impaired: Follows one step commands with increased time    Cueing Cueing Techniques: Verbal cues, Tactile cues  Exercises      General Comments General comments (skin integrity, edema, etc.): Sp02 down briefly to 87%, increasing to the 90's with seated rest breaks and cues for breathing on 4 L02.      Pertinent Vitals/Pain Pain  Assessment Pain Assessment: No/denies pain    Home Living                          Prior Function            PT Goals (current goals can now be found in the care plan section) Acute Rehab PT Goals Patient Stated Goal: to go home! PT Goal Formulation: With patient Time For Goal Achievement: 05/25/23 Potential to Achieve Goals: Good Progress towards PT goals: Progressing toward goals    Frequency    Min 3X/week      PT Plan      Co-evaluation PT/OT/SLP Co-Evaluation/Treatment: Yes Reason for Co-Treatment: For patient/therapist safety;To address functional/ADL transfers;Complexity of the patient's impairments (multi-system involvement);Necessary to address cognition/behavior during functional activity PT goals addressed during session: Mobility/safety with mobility OT goals addressed during session: ADL's and self-care      AM-PAC PT "6 Clicks" Mobility   Outcome Measure  Help needed turning from your back to your side while in a flat bed without using bedrails?: A Lot Help needed moving from lying on your back to sitting on the side of a flat bed without using bedrails?: A Lot Help needed moving to and from a bed to a chair (including a wheelchair)?: A Lot Help needed standing up from a chair using your arms (e.g., wheelchair or bedside chair)?: A Lot Help needed to walk in hospital room?: A Lot Help needed climbing 3-5 steps with a railing? : A Lot 6 Click Score: 12    End of Session   Activity Tolerance: Patient limited by fatigue Patient left: in bed;with call bell/phone within reach;with bed alarm set   PT Visit Diagnosis: Muscle weakness (generalized) (M62.81);Difficulty in walking, not elsewhere classified (R26.2)     Time: 1610-9604 PT Time Calculation (min) (ACUTE ONLY): 25 min  Charges:    $Therapeutic Activity: 8-22 mins PT General Charges $$ ACUTE PT VISIT: 1 Visit                     Donna Bernard, PT, MPT    [name] 05/13/2023, 11:13 AM

## 2023-05-13 NOTE — Progress Notes (Signed)
 ANTICOAGULATION CONSULT NOTE  Pharmacy Consult for heparin infusion Indication: pulmonary embolus  Allergies  Allergen Reactions   Bee Venom Anaphylaxis and Swelling   Hornet Venom Shortness Of Breath and Swelling   Influenza Vaccines Other (See Comments)    Seizures    Peanuts [Peanut Oil] Anaphylaxis   Phenytoin Hives and Rash   Valproic Acid Hives and Rash   Lamotrigine Rash    Patient Measurements: Height: 5\' 10"  (177.8 cm) Weight: (!) 138 kg (304 lb 3.8 oz) IBW/kg (Calculated) : 73 Heparin Dosing Weight: 104.7 kg  Vital Signs: BP: 114/72 (03/24 0400) Pulse Rate: 75 (03/24 0400)  Labs: Recent Labs    05/10/23 1224 05/10/23 1905 05/11/23 1043 05/11/23 2016 05/12/23 0308 05/12/23 0920 05/13/23 0435  HGB  --    < > 14.1  --  13.8  --  12.8*  HCT  --   --  41.6  --  40.5  --  39.0  PLT  --   --  202  --  199  --  252  APTT 102*  --   --   --   --   --   --   HEPARINUNFRC 0.69   < > 0.27*   < > 0.51 0.53 0.42  CREATININE  --   --  3.20*  --  2.59*  --  2.03*   < > = values in this interval not displayed.    Estimated Creatinine Clearance: 54.9 mL/min (A) (by C-G formula based on SCr of 2.03 mg/dL (H)).   Medical History: Past Medical History:  Diagnosis Date   CAD (coronary artery disease)    a. 04/2015 Cath: LM nl, LAD 10ost/p, RI nl, LCX 10p, OM1/2/3 nl, RCA nl. Nl LV fxn.   Chronic heart failure with preserved ejection fraction (HFpEF) (HCC)    a 03/2021 Echo: EF 60-65%, no rwma, mild LVH, nl RV fxn, mild MR, Ao sclerosis.   Depression    Hyperlipidemia    LVH (left ventricular hypertrophy)    Seizure (HCC)    Sleep apnea    SSS (sick sinus syndrome) (HCC)    a. 04/2015 s/p BSX DC PPM (ser #[id number]).   Vitamin D deficiency     Medications:  PTA Meds: Eliquis, last dose unknown  Assessment: Pt is a 58 yo male with hx of PE on Eliquis presenting to ED found unresponsive, tachypneic, hypoxic at home during wellness check.  Goal of Therapy:  Heparin  level 0.3-0.7 units/ml Monitor platelets by anticoagulation protocol: Yes   Plan:  heparin level therapeutic x 3 --continue heparin infusion at 1400 units/hr --Recheck HL daily w/ AM labs while therapeutic --CBC daily while on heparin  Thank you for involving pharmacy in this patient's care.   Otelia Sergeant, PharmD, MBA 05/13/2023 5:20 AM

## 2023-05-13 NOTE — Progress Notes (Signed)
 Inpatient Rehab Admissions Coordinator:   Per therapy recommendations,  patient was screened for CIR candidacy by Megan Salon, MS, CCC-SLP. At this time, Pt. Appears to be a a potential candidate for CIR. I will placep   order for rehab consult per protocol for full assessment. Please contact me any with questions.  Megan Salon, MS, CCC-SLP Rehab Admissions Coordinator  [phone] (celll) [phone] (office)

## 2023-05-13 NOTE — Progress Notes (Signed)
 Pt transferred to room 256 at this time. VSS prior to transfer. Report given to 2A RN. Family notified.

## 2023-05-13 NOTE — Progress Notes (Addendum)
 Inpatient Rehab Admissions Coordinator:  Consult received. Attempted to call pt and no one answered the phone. Also called pt's sister Dois Davenport. Left a message; awaiting return call.  Will continue to follow.  1143: Spoke to pt on the telephone. Discussed CIR goals and expectations. Discussed average length of stay and discharge home after completion of CIR. He acknowledged understanding. He informed AC that he doesn't think he will have 24/7 support after discharge. AC informed pt that awaiting return call from pt's sister to confirm dispo. Will continue to follow.  Wolfgang Phoenix, MS, CCC-SLP Admissions Coordinator [phone]

## 2023-05-13 NOTE — Progress Notes (Signed)
 Occupational Therapy Treatment Patient Details Name: Seth Tucker MRN: [id number] DOB: 1965/04/17 Today's Date: 05/13/2023   History of present illness Pt is a 58 year old male admitted to ICU due to acute hypoxic/ hypercapnic respiratory failure secondary to possible seizure, right basilar pneumonia and suspected small para-pnemonic effusion  after being found unresponsive, requiring emergent intubation and mechanical ventilatory support, extubated 05/10/23;     PMH significant for Seizure disorder, HFpEF  SSS s/p PPM placement, Pulmonary Embolus on Eliquis, CAD, HLD, OSA   OT comments  Mr Seth Tucker was seen for OT treatment on this date. Upon arrival to room pt in bed, agreeable to tx. Pt requires MIN A for bed mobility, good sitting balance. MIN-MOD x2 + RW for initial stand, cues for balance; improves to CGA x2 second stand. MIN A x2 + RW side steps along EOB, +2 assist for lines and RW mgmt. MAX A pericare in standing. SpO2 87%, RR 33 with activity, resolves in rest. Pt making good progress toward goals, will continue to follow POC. Discharge recommendation remains appropriate.        If plan is discharge home, recommend the following:  A lot of help with bathing/dressing/bathroom;A lot of help with walking and/or transfers;Supervision due to cognitive status   Equipment Recommendations  Other (comment) (defer)    Recommendations for Other Services      Precautions / Restrictions Precautions Precautions: Fall Recall of Precautions/Restrictions: Impaired Restrictions Weight Bearing Restrictions Per Provider Order: No       Mobility Bed Mobility Overal bed mobility: Needs Assistance Bed Mobility: Supine to Sit, Sit to Supine     Supine to sit: Min assist Sit to supine: Min assist        Transfers Overall transfer level: Needs assistance Equipment used: Rolling walker (2 wheels) Transfers: Sit to/from Stand Sit to Stand: Min assist, Mod assist, +2 physical assistance            General transfer comment: MIN-MOD x2 intial stand, cues for balance improving to CGA x2 second stand     Balance Overall balance assessment: Needs assistance Sitting-balance support: No upper extremity supported, Feet supported Sitting balance-Leahy Scale: Fair     Standing balance support: Bilateral upper extremity supported Standing balance-Leahy Scale: Fair Standing balance comment: posterior lean                           ADL either performed or assessed with clinical judgement   ADL Overall ADL's : Needs assistance/impaired                                       General ADL Comments: MIN A for LB access in sitting. MIN A x2 + RW for simulated BSC t/f. MAX A pericare in standing     Communication Communication Communication: No apparent difficulties   Cognition Arousal: Alert Behavior During Therapy: Impulsive Cognition: No family/caregiver present to determine baseline, Cognition impaired   Orientation impairments: Time, Situation         OT - Cognition Comments: follows all commands with time, cues for multi step commands                 Following commands: Impaired Following commands impaired: Follows one step commands with increased time      Cueing   Cueing Techniques: Verbal cues, Tactile cues, Visual cues, Gestural cues  Exercises      Shoulder Instructions       General Comments      Pertinent Vitals/ Pain       Pain Assessment Pain Assessment: No/denies pain   Frequency  Min 3X/week        Progress Toward Goals  OT Goals(current goals can now be found in the care plan section)  Progress towards OT goals: Progressing toward goals  Acute Rehab OT Goals OT Goal Formulation: With patient Time For Goal Achievement: 05/25/23 Potential to Achieve Goals: Poor ADL Goals Pt Will Perform Grooming: with modified independence;sitting Pt Will Perform Lower Body Dressing: with modified  independence;sit to/from stand;sitting/lateral leans Pt Will Transfer to Toilet: with modified independence;ambulating Pt Will Perform Toileting - Clothing Manipulation and hygiene: with modified independence;sitting/lateral leans;sit to/from stand  Plan      Co-evaluation    PT/OT/SLP Co-Evaluation/Treatment: Yes Reason for Co-Treatment: For patient/therapist safety;To address functional/ADL transfers;Complexity of the patient's impairments (multi-system involvement);Necessary to address cognition/behavior during functional activity PT goals addressed during session: Mobility/safety with mobility OT goals addressed during session: ADL's and self-care      AM-PAC OT "6 Clicks" Daily Activity     Outcome Measure   Help from another person eating meals?: A Little Help from another person taking care of personal grooming?: A Little Help from another person toileting, which includes using toliet, bedpan, or urinal?: A Lot Help from another person bathing (including washing, rinsing, drying)?: A Lot Help from another person to put on and taking off regular upper body clothing?: A Little Help from another person to put on and taking off regular lower body clothing?: A Lot 6 Click Score: 15    End of Session Equipment Utilized During Treatment: Rolling walker (2 wheels);Oxygen  OT Visit Diagnosis: Other abnormalities of gait and mobility (R26.89);Muscle weakness (generalized) (M62.81);Cognitive communication deficit (R41.841)   Activity Tolerance Patient tolerated treatment well   Patient Left in bed;with call bell/phone within reach;with bed alarm set   Nurse Communication Mobility status        Time: 1610-9604 OT Time Calculation (min): 23 min  Charges: OT General Charges $OT Visit: 1 Visit OT Treatments $Self Care/Home Management : 8-22 mins  Kathie Dike, M.S. OTR/L  05/13/23, 12:32 PM  ascom [phone]

## 2023-05-13 NOTE — Progress Notes (Signed)
 [facility]  PROGRESS NOTE   Subjective:   Patient resting comfortably in bed. Urine output good at 2.4 L. Creatinine down to 2.0. 03/23 0701 - 03/24 0700 In: 837.7 [P.O.:500; I.V.:335.1; IV Piggyback:2.6] Out: 2400 [Urine:2400] Lab Results  Component Value Date   CREATININE 2.03 (H) 05/13/2023   CREATININE 2.59 (H) 05/12/2023   CREATININE 3.20 (H) 05/11/2023     Objective:  Vital signs: Blood pressure 134/71, pulse 83, temperature 97.8 F (36.6 C), temperature source Oral, resp. rate (!) 26, height 5\' 10"  (1.778 m), weight (!) 140.5 kg, SpO2 93%.  Intake/Output Summary (Last 24 hours) at 05/13/2023 1301 Last data filed at 05/13/2023 1141 Gross per 24 hour  Intake 940.54 ml  Output 1825 ml  Net -884.46 ml   Filed Weights   05/11/23 0500 05/12/23 0500 05/13/23 0500  Weight: (!) 138.1 kg (!) 138 kg (!) 140.5 kg     Physical Exam: General:  No acute distress  Head:  Normocephalic, atraumatic. Moist oral mucosal membranes  Eyes:  Anicteric  Neck:  Supple  Lungs:   Clear to auscultation, normal effort  Heart:  S1S2 no rubs  Abdomen:   Soft, nontender, bowel sounds present  Extremities: 1+ peripheral edema.  Neurologic:  Awake, alert, following commands  Skin:  No lesions  Access: No hemodialysis access    Basic Metabolic Panel: Recent Labs  Lab 05/09/23 0632 05/09/23 1322 05/09/23 2018 05/10/23 0411 05/11/23 1042 05/11/23 1043 05/12/23 0308 05/13/23 0435  NA 135 136  --  141  --  138 139 140  K 5.9* 5.5* 4.8 4.4  --  3.6 3.9 4.1  CL 105 106  --  107  --  106 106 105  CO2 21* 21*  --  25  --  24 24 25   GLUCOSE 155* 157*  --  208*  --  113* 109* 107*  BUN 64* 69*  --  80*  --  78* 71* 63*  CREATININE 2.84* 3.41*  --  3.62*  --  3.20* 2.59* 2.03*  CALCIUM 7.2* 7.1*  --  7.4*  --  8.0* 7.8* 8.2*  MG 2.7*  --   --  2.9* 2.8*  --  2.9* 2.9*  PHOS 5.1*  --   --  2.8 1.5*  --  3.2 3.2   GFR: Estimated Creatinine Clearance: 55.4 mL/min (A) (by  C-G formula based on SCr of 2.03 mg/dL (H)).  Liver Function Tests: Recent Labs  Lab 05/08/23 1828 05/09/23 0632 05/11/23 1043  AST 97* 87* 54*  ALT 55* 47* 35  ALKPHOS 62 58 56  BILITOT 0.8 1.5* 0.9  PROT 8.0 6.7 7.0  ALBUMIN 3.5 2.9* 2.8*   No results for input(s): "LIPASE", "AMYLASE" in the last 168 hours. Recent Labs  Lab 05/09/23 0632  AMMONIA 92*    CBC: Recent Labs  Lab 05/08/23 1828 05/09/23 0632 05/10/23 0411 05/11/23 1043 05/12/23 0308 05/13/23 0435  WBC 14.6* 13.9* 14.6* 16.2* 15.1* 19.7*  NEUTROABS 11.5*  --   --  10.2*  --   --   HGB 16.1 15.1 13.0 14.1 13.8 12.8*  HCT 50.6 47.9 39.9 41.6 40.5 39.0  MCV 90.2 90.4 86.9 82.4 83.3 85.3  PLT 128* 122* 114* 202 199 252     HbA1C: Hgb A1c MFr Bld  Date/Time Value Ref Range Status  05/09/2023 08:18 PM 6.6 (H) 4.8 - 5.6 % Final    Comment:    (NOTE) Pre diabetes:  5.7%-6.4%  Diabetes:              >6.4%  Glycemic control for   <7.0% adults with diabetes     Urinalysis: No results for input(s): "COLORURINE", "LABSPEC", "PHURINE", "GLUCOSEU", "HGBUR", "BILIRUBINUR", "KETONESUR", "PROTEINUR", "UROBILINOGEN", "NITRITE", "LEUKOCYTESUR" in the last 72 hours.  Invalid input(s): "APPERANCEUR"    Imaging: No results found.   Medications:    heparin 1,400 Units/hr (05/13/23 0839)    Chlorhexidine Gluconate Cloth  6 each Topical Daily   docusate  100 mg Oral BID   famotidine  20 mg Oral BID   feeding supplement  237 mL Oral TID BM   insulin aspart  0-15 Units Subcutaneous Q4H   ipratropium-albuterol  3 mL Nebulization TID   lactulose  30 g Oral TID   methylPREDNISolone (SOLU-MEDROL) injection  40 mg Intravenous Q24H   [START ON 05/14/2023] multivitamin with minerals  1 tablet Oral Daily   mupirocin ointment  1 Application Nasal BID   nystatin  5 mL Oral QID   PHENObarbital  200 mg Oral QHS   polyethylene glycol  17 g Oral Daily   QUEtiapine  25 mg Oral QHS    Assessment/ Plan:      58 y.o. male with a PMHx of obese male with history of seizure disorder, hypertension, congestive heart failure, status post pacemaker placement, obstructive sleep apnea, COPD was admitted with history of unresponsive episode.  He was initially intubated and sedated.  He was found to have pneumonia and was treated with antibiotics.    #1: Acute kidney injury: Acute kidney injury most likely secondary to hemodynamic compromise due to lack of perfusion versus ATN secondary to sepsis.  Baseline creatinine 0.79. Update: Patient with good urine output of 2.4 L and creatinine down to 2.03.  Continue supportive care for now.  No immediate need for dialysis.   #2:  Volume overload.  Patient appears to have good compensation at the moment.  Currently off diuretics.   #3: Sepsis/pneumonia.  Patient was previously on azithromycin.   4.  Acute respiratory failure.  Doing much better now from a pulmonary perspective.  O2 sat 93%   LOS: 5 Artrell Lawless Cherylann Ratel, MD [facility] 3/24/20251:01 PM

## 2023-05-13 NOTE — Progress Notes (Signed)
 Nutrition Follow Up Note   DOCUMENTATION CODES:   Morbid obesity  INTERVENTION:   Ensure Enlive po TID, each supplement provides 350 kcal and 20 grams of protein.  Magic cup TID with meals, each supplement provides 290 kcal and 9 grams of protein  MVI po daily   Pt at high refeed risk; recommend monitor potassium, magnesium and phosphorus labs daily until stable  Daily weights   NUTRITION DIAGNOSIS:   Inadequate oral intake related to inability to eat (pt sedated and ventilated) as evidenced by NPO status. -resolved   GOAL:   Patient will meet greater than or equal to 90% of their needs -progressing   MONITOR:   PO intake, Supplement acceptance, Labs, Weight trends, I & O's, Skin  ASSESSMENT:   58 y/o male with h/o COPD, HLD, depression, OSA, CAD, PE, HTN, CHF, seizures, PVD, pacemaker and lymphedema who is admitted with COPD exacerbation, sepsis, AKI, PNA and SIRS.  Pt extubated 3/21. Pt initiated on a diet 3/22. Pt is documented to be eating 25-50% of meals. RD will add supplements and MVI to help pt meet his estimated needs. Pt remains at refeed risk. Per chart, pt is up ~7lbs since admission but appears to be back around his UBW. Pt -1.2L on his I & Os.   Medications reviewed and include: colace, pepcid, insulin, lactulose, solu-medrol, miralax, heparin  Labs reviewed: K 4.1 wnl, BUN 63(H), creat 2.03(H), P 3.2 wnl, Mg 2.9(H) Wbc- 19.7(H) Cbgs- 148, 115, 99 x 24 hrs   UOP-   Diet Order:   Diet Order             Diet regular Room service appropriate? Yes; Fluid consistency: Thin  Diet effective now                  EDUCATION NEEDS:   No education needs have been identified at this time  Skin:  Skin Assessment: Reviewed RN Assessment  Last BM:  3/23- TYPE 6  Height:   Ht Readings from Last 1 Encounters:  05/08/23 5\' 10"  (1.778 m)    Weight:   Wt Readings from Last 1 Encounters:  05/13/23 (!) 140.5 kg    Ideal Body Weight:  75.45  kg  BMI:  Body mass index is 44.44 kg/m.  Estimated Nutritional Needs:   Kcal:  2700-3000kcal/day  Protein:  >135g/day  Fluid:  1.9-2.2L/day  Betsey Holiday MS, RD, LDN If unable to be reached, please send secure chat to "RD inpatient" available from 8:00a-4:00p daily

## 2023-05-13 NOTE — Progress Notes (Signed)
 Progress Note   Patient: Seth Tucker UJW:[id number] DOB: 04-May-1965 DOA: 05/08/2023     5 DOS: the patient was seen and examined on 05/13/2023   Brief hospital course:  HPI on admission and Hospital Course (taken from prior ICU notes): "58 yo M presenting to [facility] ED from home via EMS for evaluation after being found unresponsive, hypoxic with a pulse.    History provided per chart review and sister telephone conference, as patient is unable to participate in interview at this time. Family reported the patient to be in his normal state of health last spoken to 2 days ago on 05/06/23 when he was headed to the [facility] for an annual check up. Sister reports he had no complaints. When asked about any issues with seizure activity, she said family was concerned that maybe he was continuing to have seizures. She stated the patient would describe falling asleep in his car and waking up 3 hours later with no memory of what occurred and that his face/head would be sore as if he had hit the steering wheel somehow. She believes he has only been taking Phenobarbital. She reports he is a current everyday smoker at about a pack a day, but is trying to quit with a 40 + pack year history. She denies ETOH or recreational drug use.   05/08/23: Admit to ICU due to acute hypoxic/ hypercapnic respiratory failure secondary to right basilar pneumonia and suspected small para-pnemonic effusion requiring emergent intubation and mechanical ventilatory support.  05/09/23-patient + for adenovirus on RVP.  Remains hypoxemic and acidemic on repeat blood gas eval.  He's on empiric abx and steroids.  05/10/23- patient weaning from [facility], on propofol weaning protocol for awakening and SBT. Blood culture with contaminant.  S/p extubation today. 05/11/23- patient awake alert , cursing at staff , asking for food wants to leave.  He does have component of altered mentation which may be metabolic in context of hyperammonimea and AKI.   Nephrology consultation is in process. Optimizing for TRH downgrade.  05/12/23- Patient tolerated BIPAP well overnight, mentation is lucid and oriented x 3.  Optimized for TRH transferring to PCU today." -------  TRH assumed care on 05/13/23. Patient on 4 L/min nasal cannula O2.  Transferred to PCU.  Further hospital course and management as outlined below.   Assessment and Plan:  Acute respiratory failure with hypoxia and hypercapnea  due to Multifocal pneumonia COPD with acute exacerbation Adenovirus infection OSA on CPAP Required emergent intubation and mechanical ventilation in ICU. Extubated 3/21 to BiPAP 3/24 -- on 4 l/min [place] O2 --Supplement O2 per protocol, target sats > 88% --Off antibiotics --Continue IV steroids --Scheduled and PRN Duonebs --Add Mucinex --IS and flutter --CPAP nightly / BiPAP as needed --Appears sputum culture not collected since 3/22 --Continuous pulse ox  Septic Shock - POA - resolved. --Monitor hemodynamics --Maintain MAP > 65  AKI - POA, Cr on admission 2.28, peaked at 3.62, baseline 0.79 Treated with IV fluids 3/24 -- Cr 2.03 improving --Monitor BMP, electrolytes --Nephrology consulted in ICU, appreciate input --Io's --Avoid nephrotoxins & hypotension  Chronic HFpEF Elevated troponin due to demand ischemia Hx of Hypertension Hx of CAD Hx of SSS s/p pacemaker Hyperlipidemia --PTA meds on hold -- Coreg, Eliquis, losartan  --resume meds when appropriate --on heparin drip currently -- transition back to Eliquis this evening --telemetry monitoring --monitor volume status - daily weights, io's  Acute metabolic encephalopathy - due to hypercapnia on admission, septic shock. Resolved. --Delirium precautions --Mgmt  of underlying conditions as outlined --Seroquel at bedtime started in ICU  History of PE on chronic anticoagulation --Currently on heparin drip --Resume Eliquis this evening  History of seizure disorder -- no apparent  acute issues or known recent seizures. --EEG ordered in ICU - follow --Continue PTA phenobarbital --Follow pending phenobarb level  Transaminitis -- likely due to septic shock Pt without abdominal complaints. --Trend LFT's --Will consider RUQ U/S  Medication noncompliance --  Note that patient's medication history shows "patient not taking" all medications except phenobarbital and albuterol. --Complicated overall care and prognosis long term --Raises risk of hospital readmissions        Subjective: Pt seen in stepdown this AM before moving to the floor.  He denies shortness of breath, says feeling better, thankfully.  Denies need for oxygen at baseline, only CPAP at night.  No other complaints.  Physical Exam: Vitals:   05/13/23 1300 05/13/23 1400 05/13/23 1500 05/13/23 1553  BP: 130/70 (!) 144/68 (!) 134/97 137/73  Pulse: 82 81 86 81  Resp: (!) 25 (!) 23 (!) 24 18  Temp:  98.2 F (36.8 C)  97.6 F (36.4 C)  TempSrc:  Oral    SpO2: 94% 94% 93% 94%  Weight:      Height:       General exam: awake, alert, no acute distress, obese, mildly ill appearing HEENT: moist mucus membranes, hearing grossly normal  Respiratory system: CTAB but diminished throughout with +expiratory wheezes, no rhonchi, normal respiratory effort on 4 l/min [place] O2. Cardiovascular system: normal S1/S2, RRR, trace BLE edema.   Gastrointestinal system: soft, NT, ND, no HSM felt, +bowel sounds. Central nervous system: A&O x 2+. no gross focal neurologic deficits, normal speech Skin: dry, intact, normal temperature Psychiatry: normal mood, congruent affect   Data Reviewed:  Notable labs --  Cr 2.03 improving Ca 8.2 Glucose 107 BUN 63 WBC 19.7 (on steroids)   Family Communication: None  Disposition: Status is: Inpatient Remains inpatient appropriate because: severity of illness, weaning oxygen, pending acute inpatient rehab consideration   Planned Discharge Destination:  Acute inpatient  rehab    Time spent: 52 minutes including time at bedside and in coordination of care with staff and consultants  Author: Pennie Banter, DO 05/13/2023 3:54 PM  For on call review [email or web address].

## 2023-05-13 NOTE — TOC Progression Note (Signed)
 Transition of Care [facility]) - Progression Note    Patient Details  Name: Seth Tucker MRN: [id number] Date of Birth: 06-27-1965  Transition of Care [facility]) CM/SW Contact  Garret Reddish, RN Phone Number: 05/13/2023, 11:31 AM  Clinical Narrative:    Chart reviewed.  Noted that PT has recommended Acute Inpatient Rehab.  CIR evaluating patient for possible admission.  TOC will continue to follow for discharge planning.          Expected Discharge Plan and Services                                               Social Determinants of Health (SDOH) Interventions SDOH Screenings   Food Insecurity: Patient Unable To Answer (05/09/2023)  Housing: Patient Unable To Answer (05/09/2023)  Transportation Needs: Patient Unable To Answer (05/09/2023)  Utilities: Patient Unable To Answer (05/09/2023)  Tobacco Use: High Risk (05/08/2023)    Readmission Risk Interventions     No data to display

## 2023-05-13 NOTE — Progress Notes (Signed)
 Speech Language Pathology Treatment: Dysphagia  Patient Details Name: Seth Tucker MRN: [id number] DOB: [birth year] Today's Date: 05/13/2023 Time: 4098-1191 SLP Time Calculation (min) (ACUTE ONLY): 15 min  Assessment / Plan / Recommendation Clinical Impression  Pt seen for follow up dysphagia intervention. Pt had dentures in room, which were independently placed. RN denied impulsivity or concern with PO intake. Pt on 4L nasal canula with O2 saturations maintained at 93-95 for duration of session. Pt seen with trials of thin liquids and regular solids. Self feeding completed following set up. No overt or subtle s/sx pharyngeal dysphagia noted. No change to vocal quality across trials. Vitals stable for duration of trials. Oral phase grossly intact with aid of dentures placed. Pt demonstrating slow rate and small bites t/o trials.   Recommend advancement to regular solids and thin liquids with general aspiration precautions (slow rate, small bites, elevated HOB, and alert for PO intake). Dentures in place for PO intake. Encourage self feeding with provision of intermittent supervision to monitor for impulsivity/need for assistance. SLP will continue to follow to ensure compliance with aspiration precautions and safety with regular solids.     HPI HPI: Per H&P "Family reported the patient to be in his normal state of health last spoken to 2 days ago on 05/06/23 when he was headed to the [facility] for an annual check up. Sister reports he had no complaints. When asked about any issues with seizure activity, she said family was concerned that maybe he was continuing to have seizures. She stated the patient would describe falling asleep in his car and waking up 3 hours later with no memory of what occurred and that his face/head would be sore as if he had hit the steering wheel somehow. She believes he has only been taking Phenobarbital. She reports he is a current everyday smoker at about a pack a day,  but is trying to quit with a 40 + pack year history. She denies ETOH or recreational drug use. A wellness check was called and the patient was found by EMS unresponsive, hypoxic next to the door- somewhat blocking the entrance. Patient then woke up and was lethargic but following commands. EMS reported diffuse wheezing with continued hypoxia. They administered solu medrol and albuterol.  Upon arrival to ED patient was responsive reporting earlier dyspnea, some chest tightness and a new cough. He denied fevers but did endorse BLE edema." CXR 05/08/23: Findings consistent with right basilar pneumonia and small para-pneumonic effusion. CT head wo contrast 05/08/23: No acute intracranial abnormality. Mild chronic microvascular ischemic disease with a few small remote lacunar infarcts about the thalami and right pons.      SLP Plan  Continue with current plan of care      Recommendations for follow up therapy are one component of a multi-disciplinary discharge planning process, led by the attending physician.  Recommendations may be updated based on patient status, additional functional criteria and insurance authorization.    Recommendations  Diet recommendations: Regular;Thin liquid Liquids provided via: Straw;Cup Medication Administration: Whole meds with liquid Supervision: Patient able to self feed;Intermittent supervision to cue for compensatory strategies Compensations: Minimize environmental distractions;Slow rate;Small sips/bites;Lingual sweep for clearance of pocketing;Follow solids with liquid Postural Changes and/or Swallow Maneuvers: Out of bed for meals;Seated upright 90 degrees;Upright 30-60 min after meal                  Oral care BID;Staff/trained caregiver to provide oral care   Intermittent Supervision/Assistance (for PO intake) Dysphagia,  oropharyngeal phase (R13.12)     Continue with current plan of care    Seth Damel Querry Clapp, MS, CCC-SLP Speech Language  Pathologist Rehab Services; [facility] [phone] (ascom)   Seth J Tucker  05/13/2023, 11:48 AM

## 2023-05-14 DIAGNOSIS — J9601 Acute respiratory failure with hypoxia: Secondary | ICD-10-CM | POA: Diagnosis not present

## 2023-05-14 LAB — HEPARIN LEVEL (UNFRACTIONATED): Heparin Unfractionated: 0.58 [IU]/mL (ref 0.30–0.70)

## 2023-05-14 LAB — COMPREHENSIVE METABOLIC PANEL
ALT: 65 U/L — ABNORMAL HIGH (ref 0–44)
AST: 63 U/L — ABNORMAL HIGH (ref 15–41)
Albumin: 2.4 g/dL — ABNORMAL LOW (ref 3.5–5.0)
Alkaline Phosphatase: 39 U/L (ref 38–126)
Anion gap: 7 (ref 5–15)
BUN: 50 mg/dL — ABNORMAL HIGH (ref 6–20)
CO2: 26 mmol/L (ref 22–32)
Calcium: 8.2 mg/dL — ABNORMAL LOW (ref 8.9–10.3)
Chloride: 109 mmol/L (ref 98–111)
Creatinine, Ser: 1.7 mg/dL — ABNORMAL HIGH (ref 0.61–1.24)
GFR, Estimated: 46 mL/min — ABNORMAL LOW (ref >60)
Glucose, Bld: 112 mg/dL — ABNORMAL HIGH (ref 70–99)
Potassium: 4 mmol/L (ref 3.5–5.1)
Sodium: 142 mmol/L (ref 135–145)
Total Bilirubin: 0.9 mg/dL (ref 0.0–1.2)
Total Protein: 6.8 g/dL (ref 6.5–8.1)

## 2023-05-14 LAB — GLUCOSE, CAPILLARY
Glucose-Capillary: 122 mg/dL — ABNORMAL HIGH (ref 70–99)
Glucose-Capillary: 123 mg/dL — ABNORMAL HIGH (ref 70–99)
Glucose-Capillary: 146 mg/dL — ABNORMAL HIGH (ref 70–99)
Glucose-Capillary: 83 mg/dL (ref 70–99)
Glucose-Capillary: 89 mg/dL (ref 70–99)
Glucose-Capillary: 93 mg/dL (ref 70–99)

## 2023-05-14 LAB — CBC
HCT: 37 % — ABNORMAL LOW (ref 39.0–52.0)
Hemoglobin: 12.3 g/dL — ABNORMAL LOW (ref 13.0–17.0)
MCH: 28.4 pg (ref 26.0–34.0)
MCHC: 33.2 g/dL (ref 30.0–36.0)
MCV: 85.5 fL (ref 80.0–100.0)
Platelets: 307 10*3/uL (ref 150–400)
RBC: 4.33 MIL/uL (ref 4.22–5.81)
RDW: 14.7 % (ref 11.5–15.5)
WBC: 16.1 10*3/uL — ABNORMAL HIGH (ref 4.0–10.5)
nRBC: 0.1 % (ref 0.0–0.2)

## 2023-05-14 LAB — PHOSPHORUS: Phosphorus: 4.1 mg/dL (ref 2.5–4.6)

## 2023-05-14 LAB — MAGNESIUM: Magnesium: 2.5 mg/dL — ABNORMAL HIGH (ref 1.7–2.4)

## 2023-05-14 LAB — AMMONIA: Ammonia: 41 umol/L — ABNORMAL HIGH (ref 9–35)

## 2023-05-14 MED ORDER — LOSARTAN POTASSIUM 50 MG PO TABS
50.0000 mg | ORAL_TABLET | Freq: Every day | ORAL | Status: DC
  Administered 2023-05-14 – 2023-05-20 (×7): 50 mg via ORAL
  Filled 2023-05-14 (×7): qty 1

## 2023-05-14 MED ORDER — PREDNISONE 20 MG PO TABS
40.0000 mg | ORAL_TABLET | Freq: Every day | ORAL | Status: DC
  Administered 2023-05-15 – 2023-05-17 (×3): 40 mg via ORAL
  Filled 2023-05-14 (×3): qty 2

## 2023-05-14 NOTE — Progress Notes (Addendum)
 Progress Note   Patient: Seth Tucker JWJ:[id number] DOB: 22-Aug-1965 DOA: 05/08/2023     6 DOS: the patient was seen and examined on 05/14/2023   Brief hospital course:  HPI on admission and Hospital Course (taken from prior ICU notes): "58 yo M presenting to [facility] ED from home via EMS for evaluation after being found unresponsive, hypoxic with a pulse.    History provided per chart review and sister telephone conference, as patient is unable to participate in interview at this time. Family reported the patient to be in his normal state of health last spoken to 2 days ago on 05/06/23 when he was headed to the [facility] for an annual check up. Sister reports he had no complaints. When asked about any issues with seizure activity, she said family was concerned that maybe he was continuing to have seizures. She stated the patient would describe falling asleep in his car and waking up 3 hours later with no memory of what occurred and that his face/head would be sore as if he had hit the steering wheel somehow. She believes he has only been taking Phenobarbital. She reports he is a current everyday smoker at about a pack a day, but is trying to quit with a 40 + pack year history. She denies ETOH or recreational drug use.   05/08/23: Admit to ICU due to acute hypoxic/ hypercapnic respiratory failure secondary to right basilar pneumonia and suspected small para-pnemonic effusion requiring emergent intubation and mechanical ventilatory support.  05/09/23-patient + for adenovirus on RVP.  Remains hypoxemic and acidemic on repeat blood gas eval.  He's on empiric abx and steroids.  05/10/23- patient weaning from [facility], on propofol weaning protocol for awakening and SBT. Blood culture with contaminant.  S/p extubation today. 05/11/23- patient awake alert , cursing at staff , asking for food wants to leave.  He does have component of altered mentation which may be metabolic in context of hyperammonimea and AKI.   Nephrology consultation is in process. Optimizing for TRH downgrade.  05/12/23- Patient tolerated BIPAP well overnight, mentation is lucid and oriented x 3.  Optimized for TRH transferring to PCU today." -------  TRH assumed care on 05/13/23. Patient on 4 L/min nasal cannula O2.  Transferred to PCU.  Further hospital course and management as outlined below.   Assessment and Plan:  Acute respiratory failure with hypoxia and hypercapnea  due to Multifocal pneumonia COPD with acute exacerbation Adenovirus infection OSA on CPAP Required emergent intubation and mechanical ventilation in ICU. Extubated 3/21 to BiPAP 3/24--25 -- on 4 l/min [place] O2 --Supplement O2 per protocol, target sats > 88% --Off antibiotics --Transition IV steroids >> PO Prednisone tomorrow AM --Scheduled and PRN Duonebs --Mucinex --IS and flutter --CPAP nightly / BiPAP as needed --Appears sputum culture not collected since 3/22 --Continuous pulse ox  Septic Shock - POA - resolved. --Monitor hemodynamics --Maintain MAP > 65  AKI - POA, Cr on admission 2.28, peaked at 3.62, baseline 0.79 Treated with IV fluids 3/24 -- Cr 2.03 improving  3/25 -- Cr 1.70 --Monitor BMP, electrolytes --Nephrology consulted in ICU, appreciate input --Io's --Avoid nephrotoxins & hypotension  Chronic HFpEF Elevated troponin due to demand ischemia Hx of Hypertension Hx of CAD Hx of SSS s/p pacemaker Hyperlipidemia --Resume losartan with BP's now elevated today --Continue holding Coreg, resume when appropriate --Heparin drip >> Eliquis last evening --telemetry monitoring --monitor volume status - daily weights, io's  Acute metabolic encephalopathy - due to hypercapnia on admission, septic shock.  Resolved. --Delirium precautions --Mgmt of underlying conditions as outlined --Seroquel at bedtime started in ICU  History of PE on chronic anticoagulation --Currently on heparin drip --Resume Eliquis this evening  History of  seizure disorder -- no apparent acute issues or known recent seizures. --EEG ordered in ICU - follow --Continue PTA phenobarbital --Follow pending phenobarb level  Transaminitis -- likely due to septic shock Pt without abdominal complaints. --Trend LFT's --Will consider RUQ U/S  Medication noncompliance --  Note that patient's medication history shows "patient not taking" all medications except phenobarbital and albuterol. --Complicated overall care and prognosis long term --Raises risk of hospital readmissions        Subjective: Pt sleeping when seen this AM, wakes to voice. Denies acute complaints besides fatigue.  Wheezing but denies feeling short of breath.  Physical Exam: Vitals:   05/14/23 0448 05/14/23 0500 05/14/23 0833 05/14/23 1118  BP: (!) 148/72   (!) 176/81  Pulse: 85   84  Resp: 20     Temp: 98.4 F (36.9 C)   97.9 F (36.6 C)  TempSrc: Oral     SpO2: 97%  94% 97%  Weight:  134.2 kg    Height:       General exam: sleeping, wakes to voice, no acute distress, obese, mildly ill appearing HEENT: moist mucus membranes, hearing grossly normal  Respiratory system: diminished breath sounds, +wheezes, no rhonchi, normal respiratory effort on 4 l/min [place] O2. Cardiovascular system: normal S1/S2, RRR, trace BLE edema.   Gastrointestinal system: soft, NT, ND, no HSM felt, +bowel sounds. Central nervous system: A&O x 2+. no gross focal neurologic deficits, normal speech Skin: dry, intact, normal temperature Psychiatry: normal mood, congruent affect   Data Reviewed:  Notable labs --  Cr 2.03 >> 1.70 improving Ca 8.2 Glucose 112 BUN 50  WBC 19.7 (on steroids) >> 16.1 Hbg stable 12.3   Family Communication: None  Disposition: Status is: Inpatient Remains inpatient appropriate because: weaning oxygen    Planned Discharge Destination:  Acute inpatient rehab    Time spent: 42 minutes  Author: Pennie Banter, DO 05/14/2023 12:50 PM  For on call  review [email or web address].

## 2023-05-14 NOTE — Progress Notes (Signed)
 Occupational Therapy Treatment Patient Details Name: Seth Tucker MRN: [id number] DOB: 1966-01-27 Today's Date: 05/14/2023   History of present illness Pt is a 58 year old male admitted to ICU due to acute hypoxic/ hypercapnic respiratory failure secondary to possible seizure, right basilar pneumonia and suspected small para-pnemonic effusion  after being found unresponsive, requiring emergent intubation and mechanical ventilatory support, extubated 05/10/23;     PMH significant for Seizure disorder, HFpEF  SSS s/p PPM placement, Pulmonary Embolus on Eliquis, CAD, HLD, OSA   OT comments  Pt up in chair with visitors present upon OT arrival, willing to step out to allow pt to participate in OT session.  Pt in agreement to OT session.  Pt tolerated functional mobility around bedside today in prep for reaching sink at bedside to complete oral care.  STS min A from recliner, and min guard from elevated EOB.  Pt required several min of seated rest break between ambulation and standing oral care.  Standing tolerance ~2 min.  Constant multimodal cues for PLB required throughout session.  Sp02 92% at rest at start of tx on 4L. Maintained at 92-94% with functional mobility around bed. Desat to mid 80s briefly after standing to complete oral care. Returned to 90-91% after several min of seated rest and PLB. OT increased to 5L end of session per RN for recovery after session as pt was still 89-90% after several min of PLB once back in recliner. RN reported she would check back in on pt and reduce back to 4L after physical recovery from session.  Pt continues to present with decreased awareness of deficits, verbalizing desire to go home today or tomorrow.  OT provided positive reinforcement of pt's progress made thus far, but recommending additional therapy upon d/c from hospital in order to maximize indep with ADLs, as well as to increase ADL safety and activity tolerance.  OT left pt in recliner with visitors  present at end of session.  Will continue to follow to work towards OT goals in [place].        If plan is discharge home, recommend the following:  A lot of help with bathing/dressing/bathroom;A lot of help with walking and/or transfers;Supervision due to cognitive status;Help with stairs or ramp for entrance   Equipment Recommendations  Other (comment) (defer to next venue of care)    Recommendations for Other Services      Precautions / Restrictions Precautions Precautions: Fall Recall of Precautions/Restrictions: Impaired Restrictions Weight Bearing Restrictions Per Provider Order: No       Mobility Bed Mobility               General bed mobility comments: NT this date as pt was received and left in recliner end of session Patient Response: Cooperative  Transfers Overall transfer level: Needs assistance Equipment used: Rolling walker (2 wheels) Transfers: Sit to/from Stand Sit to Stand: Min assist           General transfer comment: vc and tactile cues for hand placement during all STS transfers     Balance Overall balance assessment: Needs assistance Sitting-balance support: Feet supported, No upper extremity supported Sitting balance-Leahy Scale: Fair     Standing balance support: Bilateral upper extremity supported, Reliant on assistive device for balance, During functional activity Standing balance-Leahy Scale: Poor               High level balance activites: Side stepping High Level Balance Comments: side stepping between bed and sink with min A to  safely negotiate walker           ADL either performed or assessed with clinical judgement   ADL Overall ADL's : Needs assistance/impaired     Grooming: Oral care;Contact guard assist;Standing Grooming Details (indicate cue type and reason): Vc throughout for EC and PLB technique; OT assisted with toothpaste/brush set up in sitting while pt practiced PLB                              Functional mobility during ADLs: Minimal assistance;Rolling walker (2 wheels) General ADL Comments: Ambulatory around bedside from recliner to sink countertop to participate in standing grooming tasks.  Pt took several min of seated rest break on EOB with cues for PLB before standing to brush teeth.  Several min of seated rest and PLB before standing again to ambulate to other side of bed for return to recliner after brushing teeth.    Extremity/Trunk Assessment Upper Extremity Assessment Upper Extremity Assessment: Generalized weakness   Lower Extremity Assessment Lower Extremity Assessment: Generalized weakness        Vision Patient Visual Report: No change from baseline               Communication Communication Communication: Impaired Factors Affecting Communication: Difficulty expressing self   Cognition Arousal: Alert Behavior During Therapy: WFL for tasks assessed/performed       Awareness: Intellectual awareness impaired, Online awareness impaired Memory impairment (select all impairments): Short-term memory, Working memory Attention impairment (select first level of impairment): Sustained attention Executive functioning impairment (select all impairments): Reasoning, Problem solving                   Following commands: Impaired Following commands impaired: Follows one step commands with increased time      Cueing   Cueing Techniques: Verbal cues, Tactile cues  Exercises Other Exercises Other Exercises: Educ provided on PLB technique; constant verbal and visual cues required throughout session to implement           General Comments Positioned pt with legs elevated in recliner end of session d/t BLEs    Pertinent Vitals/ Pain       Pain Assessment Pain Assessment: No/denies pain  Home Living                                          Prior Functioning/Environment              Frequency  Min 3X/week        Progress  Toward Goals  OT Goals(current goals can now be found in the care plan section)  Progress towards OT goals: Progressing toward goals  Acute Rehab OT Goals Patient Stated Goal: go home OT Goal Formulation: With patient Time For Goal Achievement: 05/25/23 Potential to Achieve Goals: Poor  Plan                       AM-PAC OT "6 Clicks" Daily Activity     Outcome Measure   Help from another person eating meals?: A Little Help from another person taking care of personal grooming?: A Little Help from another person toileting, which includes using toliet, bedpan, or urinal?: A Lot Help from another person bathing (including washing, rinsing, drying)?: A Lot Help from another person to put on and taking off regular upper  body clothing?: A Little Help from another person to put on and taking off regular lower body clothing?: A Lot 6 Click Score: 15    End of Session Equipment Utilized During Treatment: Rolling walker (2 wheels);Oxygen  OT Visit Diagnosis: Other abnormalities of gait and mobility (R26.89);Muscle weakness (generalized) (M62.81);Cognitive communication deficit (R41.841)   Activity Tolerance Patient tolerated treatment well   Patient Left in chair;with call bell/phone within reach;with family/visitor present   Nurse Communication Mobility status;Other (comment) (02 sats/activity tolerance)        Time: 1610-9604 OT Time Calculation (min): 30 min  Charges: OT General Charges $OT Visit: 1 Visit OT Treatments $Self Care/Home Management : 23-37 mins  Danelle Earthly, MS, OTR/L   Otis Dials 05/14/2023, 4:57 PM

## 2023-05-14 NOTE — Progress Notes (Signed)
 [facility]  PROGRESS NOTE   Subjective:   Patient sitting up in bed Partially completed breakfast tray at bedside States he didn't rest well overnight.    03/24 0701 - 03/25 0700 In: 700.5 [P.O.:560; I.V.:140.5] Out: 800 [Urine:800] Lab Results  Component Value Date   CREATININE 1.70 (H) 05/14/2023   CREATININE 2.03 (H) 05/13/2023   CREATININE 2.59 (H) 05/12/2023     Objective:  Vital signs: Blood pressure (!) 176/81, pulse 84, temperature 97.9 F (36.6 C), resp. rate 20, height 5\' 10"  (1.778 m), weight 134.2 kg, SpO2 97%.  Intake/Output Summary (Last 24 hours) at 05/14/2023 1443 Last data filed at 05/13/2023 2340 Gross per 24 hour  Intake 500.54 ml  Output 500 ml  Net 0.54 ml   Filed Weights   05/12/23 0500 05/13/23 0500 05/14/23 0500  Weight: (!) 138 kg (!) 140.5 kg 134.2 kg     Physical Exam: General:  No acute distress  Head:  Normocephalic, atraumatic. Moist oral mucosal membranes  Eyes:  Anicteric  Neck:  Supple  Lungs:   Clear to auscultation, normal effort  Heart:  S1S2 no rubs  Abdomen:   Soft, nontender, bowel sounds present  Extremities: 1+ peripheral edema.  Neurologic:  Awake, alert, following commands  Skin:  No lesions  Access: No hemodialysis access    Basic Metabolic Panel: Recent Labs  Lab 05/10/23 0411 05/11/23 1042 05/11/23 1043 05/12/23 0308 05/13/23 0435 05/14/23 0601  NA 141  --  138 139 140 142  K 4.4  --  3.6 3.9 4.1 4.0  CL 107  --  106 106 105 109  CO2 25  --  24 24 25 26   GLUCOSE 208*  --  113* 109* 107* 112*  BUN 80*  --  78* 71* 63* 50*  CREATININE 3.62*  --  3.20* 2.59* 2.03* 1.70*  CALCIUM 7.4*  --  8.0* 7.8* 8.2* 8.2*  MG 2.9* 2.8*  --  2.9* 2.9* 2.5*  PHOS 2.8 1.5*  --  3.2 3.2 4.1   GFR: Estimated Creatinine Clearance: 64.5 mL/min (A) (by C-G formula based on SCr of 1.7 mg/dL (H)).  Liver Function Tests: Recent Labs  Lab 05/08/23 1828 05/09/23 0632 05/11/23 1043 05/14/23 0601  AST 97*  87* 54* 63*  ALT 55* 47* 35 65*  ALKPHOS 62 58 56 39  BILITOT 0.8 1.5* 0.9 0.9  PROT 8.0 6.7 7.0 6.8  ALBUMIN 3.5 2.9* 2.8* 2.4*   No results for input(s): "LIPASE", "AMYLASE" in the last 168 hours. Recent Labs  Lab 05/09/23 0632 05/14/23 0601  AMMONIA 92* 41*    CBC: Recent Labs  Lab 05/08/23 1828 05/09/23 1610 05/10/23 0411 05/11/23 1043 05/12/23 0308 05/13/23 0435 05/14/23 0601  WBC 14.6*   < > 14.6* 16.2* 15.1* 19.7* 16.1*  NEUTROABS 11.5*  --   --  10.2*  --   --   --   HGB 16.1   < > 13.0 14.1 13.8 12.8* 12.3*  HCT 50.6   < > 39.9 41.6 40.5 39.0 37.0*  MCV 90.2   < > 86.9 82.4 83.3 85.3 85.5  PLT 128*   < > 114* 202 199 252 307   < > = values in this interval not displayed.     HbA1C: Hgb A1c MFr Bld  Date/Time Value Ref Range Status  05/09/2023 08:18 PM 6.6 (H) 4.8 - 5.6 % Final    Comment:    (NOTE) Pre diabetes:  5.7%-6.4%  Diabetes:              >6.4%  Glycemic control for   <7.0% adults with diabetes     Urinalysis: No results for input(s): "COLORURINE", "LABSPEC", "PHURINE", "GLUCOSEU", "HGBUR", "BILIRUBINUR", "KETONESUR", "PROTEINUR", "UROBILINOGEN", "NITRITE", "LEUKOCYTESUR" in the last 72 hours.  Invalid input(s): "APPERANCEUR"    Imaging: No results found.   Medications:      apixaban  5 mg Oral BID   Chlorhexidine Gluconate Cloth  6 each Topical Daily   dextromethorphan-guaiFENesin  1 tablet Oral BID   docusate  100 mg Oral BID   famotidine  20 mg Oral BID   feeding supplement  237 mL Oral TID BM   insulin aspart  0-15 Units Subcutaneous Q4H   ipratropium-albuterol  3 mL Nebulization TID   lactulose  30 g Oral TID   losartan  50 mg Oral Daily   multivitamin with minerals  1 tablet Oral Daily   nystatin  5 mL Oral QID   PHENObarbital  200 mg Oral QHS   polyethylene glycol  17 g Oral Daily   [START ON 05/15/2023] predniSONE  40 mg Oral Q breakfast   QUEtiapine  25 mg Oral QHS    Assessment/ Plan:     58 y.o.  male with a PMHx of obese male with history of seizure disorder, hypertension, congestive heart failure, status post pacemaker placement, obstructive sleep apnea, COPD was admitted with history of unresponsive episode.  He was initially intubated and sedated.  He was found to have pneumonia and was treated with antibiotics.    #1: Acute kidney injury: Acute kidney injury most likely secondary to hemodynamic compromise due to lack of perfusion versus ATN secondary to sepsis.  Baseline creatinine 0.79. Update: Creatinine continues to improve. Urine output . No acute indication for dialysis.    #2:  Volume overload.  Patient appears to have good compensation at the moment.   Decent urine output. Diuretics remain held.    #3: Sepsis/pneumonia.  Patient was previously on azithromycin.   4.  Acute respiratory failure.  Weaned 4L [place]   LOS: 6 [facility] 3/25/20252:43 PM

## 2023-05-14 NOTE — Progress Notes (Signed)
 Physical Therapy Treatment Patient Details Name: Seth Tucker MRN: [id number] DOB: [birth year] Today's Date: 05/14/2023   History of Present Illness Pt is a 58 year old male admitted to ICU due to acute hypoxic/ hypercapnic respiratory failure secondary to possible seizure, right basilar pneumonia and suspected small para-pnemonic effusion  after being found unresponsive, requiring emergent intubation and mechanical ventilatory support, extubated 05/10/23;     PMH significant for Seizure disorder, HFpEF  SSS s/p PPM placement, Pulmonary Embolus on Eliquis, CAD, HLD, OSA    PT Comments  Patient more lethargic today than during prior session. He was able to perform bed mobility with one person assistance. Cues for technique with increased time and effort required. Incremental lateral scooting performed along bed with increasing independence. Standing not attempted out of safety due to lethargy. Recommend to continue PT to maximize independence and decrease caregiver burden. Anticipate patient will need continued rehabilitation at discharge.    If plan is discharge home, recommend the following: Two people to help with walking and/or transfers;Two people to help with bathing/dressing/bathroom   Can travel by private vehicle        Equipment Recommendations  Rolling walker (2 wheels)    Recommendations for Other Services       Precautions / Restrictions Precautions Precautions: Fall Recall of Precautions/Restrictions: Impaired Restrictions Weight Bearing Restrictions Per Provider Order: No     Mobility  Bed Mobility Overal bed mobility: Needs Assistance Bed Mobility: Supine to Sit, Sit to Supine     Supine to sit: Max assist Sit to supine: Mod assist   General bed mobility comments: increased time and effort required for bed mobility efforts. assistance for trunk and BLE support with cues for exercise technique    Transfers Overall transfer level: Needs assistance    Transfers: Bed to chair/wheelchair/BSC            Lateral/Scoot Transfers: Min assist, Mod assist, From elevated surface General transfer comment: verbal cues for technique. initially patient required Mod A for incrmental lateral scoots, progressing to Min A. standing not attempted out of safety due to lethargy    Ambulation/Gait                   Stairs             Wheelchair Mobility     Tilt Bed    Modified Rankin (Stroke Patients Only)       Balance Overall balance assessment: Needs assistance Sitting-balance support: Feet supported, No upper extremity supported Sitting balance-Leahy Scale: Fair                                      Hotel manager: Impaired Factors Affecting Communication: Difficulty expressing self  Cognition Arousal: Lethargic Behavior During Therapy: WFL for tasks assessed/performed   PT - Cognitive impairments: No family/caregiver present to determine baseline                         Following commands: Impaired Following commands impaired: Follows one step commands with increased time    Cueing Cueing Techniques: Verbal cues, Tactile cues  Exercises General Exercises - Lower Extremity Long Arc Quad: AROM, Strengthening, Both, 5 reps, Seated    General Comments General comments (skin integrity, edema, etc.): heart rate in the 90's with mobility with no dizziness reported.      Pertinent Vitals/Pain Pain Assessment Pain  Assessment: No/denies pain    Home Living                          Prior Function            PT Goals (current goals can now be found in the care plan section) Acute Rehab PT Goals Patient Stated Goal: to go home! PT Goal Formulation: With patient Time For Goal Achievement: 05/25/23 Potential to Achieve Goals: Good Progress towards PT goals: Progressing toward goals    Frequency    Min 3X/week      PT Plan       Co-evaluation              AM-PAC PT "6 Clicks" Mobility   Outcome Measure  Help needed turning from your back to your side while in a flat bed without using bedrails?: A Lot Help needed moving from lying on your back to sitting on the side of a flat bed without using bedrails?: A Lot Help needed moving to and from a bed to a chair (including a wheelchair)?: A Lot Help needed standing up from a chair using your arms (e.g., wheelchair or bedside chair)?: A Lot Help needed to walk in hospital room?: A Lot Help needed climbing 3-5 steps with a railing? : A Lot 6 Click Score: 12    End of Session   Activity Tolerance: Patient limited by lethargy Patient left: in bed;with call bell/phone within reach;with bed alarm set   PT Visit Diagnosis: Muscle weakness (generalized) (M62.81);Difficulty in walking, not elsewhere classified (R26.2)     Time: 4098-1191 PT Time Calculation (min) (ACUTE ONLY): 16 min  Charges:    $Therapeutic Activity: 8-22 mins PT General Charges $$ ACUTE PT VISIT: 1 Visit                     Donna Bernard, PT, MPT    [name] 05/14/2023, 1:08 PM

## 2023-05-14 NOTE — TOC Progression Note (Signed)
 Transition of Care [facility]) - Progression Note    Patient Details  Name: Seth Tucker MRN: [id number] Date of Birth: 1965/11/01  Transition of [facility]) CM/SW Contact  Truddie Hidden, RN Phone Number: 05/14/2023, 12:43 PM  Clinical Narrative:    Attempt to reach patient's sister, Dois Davenport regarding discharge plans. No answer. Left a message.         Expected Discharge Plan and Services                                               Social Determinants of Health (SDOH) Interventions SDOH Screenings   Food Insecurity: Patient Unable To Answer (05/09/2023)  Housing: Patient Unable To Answer (05/09/2023)  Transportation Needs: Patient Unable To Answer (05/09/2023)  Utilities: Patient Unable To Answer (05/09/2023)  Tobacco Use: High Risk (05/08/2023)    Readmission Risk Interventions     No data to display

## 2023-05-14 NOTE — Progress Notes (Signed)
 Inpatient Rehabilitation Admissions Coordinator   I have left a voicemail for his sister, Dois Davenport, to contact me to discuss caregiver supports once he is discharged. My partner, Leotis Shames, has also left her a voicemail yesterday. If 24/7 caregiver supports can not be confirmed, other rehab venues will need to be pursed at this time.  Ottie Glazier, RN, MSN Rehab Admissions Coordinator [phone] 05/14/2023 11:44 AM

## 2023-05-14 NOTE — Plan of Care (Signed)
  Problem: Activity: Goal: Ability to tolerate increased activity will improve Outcome: Progressing   Problem: Respiratory: Goal: Ability to maintain a clear airway and adequate ventilation will improve Outcome: Progressing   Problem: Role Relationship: Goal: Method of communication will improve Outcome: Progressing   Problem: Education: Goal: Knowledge of General Education information will improve Description: Including pain rating scale, medication(s)/side effects and non-pharmacologic comfort measures Outcome: Progressing   Problem: Health Behavior/Discharge Planning: Goal: Ability to manage health-related needs will improve Outcome: Progressing   Problem: Clinical Measurements: Goal: Ability to maintain clinical measurements within normal limits will improve Outcome: Progressing Goal: Will remain free from infection Outcome: Progressing Goal: Diagnostic test results will improve Outcome: Progressing Goal: Respiratory complications will improve Outcome: Progressing Goal: Cardiovascular complication will be avoided Outcome: Progressing   Problem: Activity: Goal: Risk for activity intolerance will decrease Outcome: Progressing   Problem: Nutrition: Goal: Adequate nutrition will be maintained Outcome: Progressing   Problem: Coping: Goal: Level of anxiety will decrease Outcome: Progressing   Problem: Elimination: Goal: Will not experience complications related to bowel motility Outcome: Progressing Goal: Will not experience complications related to urinary retention Outcome: Progressing   Problem: Pain Managment: Goal: General experience of comfort will improve and/or be controlled Outcome: Progressing   Problem: Safety: Goal: Ability to remain free from injury will improve Outcome: Progressing   Problem: Skin Integrity: Goal: Risk for impaired skin integrity will decrease Outcome: Progressing   Problem: Education: Goal: Ability to describe self-care  measures that may prevent or decrease complications (Diabetes Survival Skills Education) will improve Outcome: Progressing Goal: Individualized Educational Video(s) Outcome: Progressing   Problem: Coping: Goal: Ability to adjust to condition or change in health will improve Outcome: Progressing   Problem: Fluid Volume: Goal: Ability to maintain a balanced intake and output will improve Outcome: Progressing   Problem: Health Behavior/Discharge Planning: Goal: Ability to identify and utilize available resources and services will improve Outcome: Progressing Goal: Ability to manage health-related needs will improve Outcome: Progressing   Problem: Metabolic: Goal: Ability to maintain appropriate glucose levels will improve Outcome: Progressing   Problem: Nutritional: Goal: Maintenance of adequate nutrition will improve Outcome: Progressing Goal: Progress toward achieving an optimal weight will improve Outcome: Progressing   Problem: Skin Integrity: Goal: Risk for impaired skin integrity will decrease Outcome: Progressing   Problem: Tissue Perfusion: Goal: Adequacy of tissue perfusion will improve Outcome: Progressing

## 2023-05-15 DIAGNOSIS — J9601 Acute respiratory failure with hypoxia: Secondary | ICD-10-CM | POA: Diagnosis not present

## 2023-05-15 LAB — RENAL FUNCTION PANEL
Albumin: 2.4 g/dL — ABNORMAL LOW (ref 3.5–5.0)
Anion gap: 7 (ref 5–15)
BUN: 40 mg/dL — ABNORMAL HIGH (ref 6–20)
CO2: 24 mmol/L (ref 22–32)
Calcium: 8.5 mg/dL — ABNORMAL LOW (ref 8.9–10.3)
Chloride: 112 mmol/L — ABNORMAL HIGH (ref 98–111)
Creatinine, Ser: 1.49 mg/dL — ABNORMAL HIGH (ref 0.61–1.24)
GFR, Estimated: 54 mL/min — ABNORMAL LOW (ref >60)
Glucose, Bld: 95 mg/dL (ref 70–99)
Phosphorus: 4.6 mg/dL (ref 2.5–4.6)
Potassium: 4.1 mmol/L (ref 3.5–5.1)
Sodium: 143 mmol/L (ref 135–145)

## 2023-05-15 LAB — BLOOD GAS, VENOUS
Acid-base deficit: 6.4 mmol/L — ABNORMAL HIGH (ref 0.0–2.0)
Bicarbonate: 24.8 mmol/L (ref 20.0–28.0)
FIO2: 60 %
MECHVT: 450 mL
Mechanical Rate: 24
O2 Saturation: 46 %
PEEP: 5 cmH2O
Patient temperature: 37
pCO2, Ven: 78 mmHg (ref 44–60)
pH, Ven: 7.11 — CL (ref 7.25–7.43)

## 2023-05-15 LAB — CBC
HCT: 36.9 % — ABNORMAL LOW (ref 39.0–52.0)
Hemoglobin: 11.8 g/dL — ABNORMAL LOW (ref 13.0–17.0)
MCH: 28 pg (ref 26.0–34.0)
MCHC: 32 g/dL (ref 30.0–36.0)
MCV: 87.6 fL (ref 80.0–100.0)
Platelets: 332 10*3/uL (ref 150–400)
RBC: 4.21 MIL/uL — ABNORMAL LOW (ref 4.22–5.81)
RDW: 14.9 % (ref 11.5–15.5)
WBC: 11.4 10*3/uL — ABNORMAL HIGH (ref 4.0–10.5)
nRBC: 0 % (ref 0.0–0.2)

## 2023-05-15 LAB — GLUCOSE, CAPILLARY
Glucose-Capillary: 115 mg/dL — ABNORMAL HIGH (ref 70–99)
Glucose-Capillary: 128 mg/dL — ABNORMAL HIGH (ref 70–99)
Glucose-Capillary: 129 mg/dL — ABNORMAL HIGH (ref 70–99)
Glucose-Capillary: 142 mg/dL — ABNORMAL HIGH (ref 70–99)
Glucose-Capillary: 91 mg/dL (ref 70–99)
Glucose-Capillary: 99 mg/dL (ref 70–99)

## 2023-05-15 NOTE — Progress Notes (Signed)
 [facility]  PROGRESS NOTE   Subjective:   Patient seen sitting up in bed Alert and oriented Denies pain or discomfort 3 L nasal cannula   03/25 0701 - 03/26 0700 In: 360 [P.O.:360] Out: 775 [Urine:775] Lab Results  Component Value Date   CREATININE 1.49 (H) 05/15/2023   CREATININE 1.70 (H) 05/14/2023   CREATININE 2.03 (H) 05/13/2023     Objective:  Vital signs: Blood pressure 136/70, pulse 77, temperature 98.7 F (37.1 C), temperature source Oral, resp. rate 16, height 5\' 10"  (1.778 m), weight 135.4 kg, SpO2 93%.  Intake/Output Summary (Last 24 hours) at 05/15/2023 1302 Last data filed at 05/15/2023 0945 Gross per 24 hour  Intake 360 ml  Output 1225 ml  Net -865 ml   Filed Weights   05/13/23 0500 05/14/23 0500 05/15/23 0448  Weight: (!) 140.5 kg 134.2 kg 135.4 kg     Physical Exam: General:  No acute distress  Head:  Normocephalic, atraumatic. Moist oral mucosal membranes  Eyes:  Anicteric  Lungs:   Clear to auscultation, normal effort  Heart:  S1S2 no rubs  Abdomen:   Soft, nontender, bowel sounds present  Extremities: 1+ peripheral edema.  Neurologic:  Awake, alert, following commands  Skin:  No lesions  Access: No hemodialysis access    Basic Metabolic Panel: Recent Labs  Lab 05/10/23 0411 05/11/23 1042 05/11/23 1043 05/12/23 0308 05/13/23 0435 05/14/23 0601 05/15/23 0430  NA 141  --  138 139 140 142 143  K 4.4  --  3.6 3.9 4.1 4.0 4.1  CL 107  --  106 106 105 109 112*  CO2 25  --  24 24 25 26 24   GLUCOSE 208*  --  113* 109* 107* 112* 95  BUN 80*  --  78* 71* 63* 50* 40*  CREATININE 3.62*  --  3.20* 2.59* 2.03* 1.70* 1.49*  CALCIUM 7.4*  --  8.0* 7.8* 8.2* 8.2* 8.5*  MG 2.9* 2.8*  --  2.9* 2.9* 2.5*  --   PHOS 2.8 1.5*  --  3.2 3.2 4.1 4.6   GFR: Estimated Creatinine Clearance: 74 mL/min (A) (by C-G formula based on SCr of 1.49 mg/dL (H)).  Liver Function Tests: Recent Labs  Lab 05/08/23 1828 05/09/23 0632 05/11/23 1043  05/14/23 0601 05/15/23 0430  AST 97* 87* 54* 63*  --   ALT 55* 47* 35 65*  --   ALKPHOS 62 58 56 39  --   BILITOT 0.8 1.5* 0.9 0.9  --   PROT 8.0 6.7 7.0 6.8  --   ALBUMIN 3.5 2.9* 2.8* 2.4* 2.4*   No results for input(s): "LIPASE", "AMYLASE" in the last 168 hours. Recent Labs  Lab 05/09/23 0632 05/14/23 0601  AMMONIA 92* 41*    CBC: Recent Labs  Lab 05/08/23 1828 05/09/23 0632 05/11/23 1043 05/12/23 0308 05/13/23 0435 05/14/23 0601 05/15/23 0430  WBC 14.6*   < > 16.2* 15.1* 19.7* 16.1* 11.4*  NEUTROABS 11.5*  --  10.2*  --   --   --   --   HGB 16.1   < > 14.1 13.8 12.8* 12.3* 11.8*  HCT 50.6   < > 41.6 40.5 39.0 37.0* 36.9*  MCV 90.2   < > 82.4 83.3 85.3 85.5 87.6  PLT 128*   < > 202 199 252 307 332   < > = values in this interval not displayed.     HbA1C: Hgb A1c MFr Bld  Date/Time Value Ref Range Status  05/09/2023 08:18 PM 6.6 (H) 4.8 - 5.6 % Final    Comment:    (NOTE) Pre diabetes:          5.7%-6.4%  Diabetes:              >6.4%  Glycemic control for   <7.0% adults with diabetes     Urinalysis: No results for input(s): "COLORURINE", "LABSPEC", "PHURINE", "GLUCOSEU", "HGBUR", "BILIRUBINUR", "KETONESUR", "PROTEINUR", "UROBILINOGEN", "NITRITE", "LEUKOCYTESUR" in the last 72 hours.  Invalid input(s): "APPERANCEUR"    Imaging: No results found.   Medications:      apixaban  5 mg Oral BID   Chlorhexidine Gluconate Cloth  6 each Topical Daily   dextromethorphan-guaiFENesin  1 tablet Oral BID   docusate  100 mg Oral BID   famotidine  20 mg Oral BID   feeding supplement  237 mL Oral TID BM   insulin aspart  0-15 Units Subcutaneous Q4H   ipratropium-albuterol  3 mL Nebulization TID   lactulose  30 g Oral TID   losartan  50 mg Oral Daily   multivitamin with minerals  1 tablet Oral Daily   nystatin  5 mL Oral QID   PHENObarbital  200 mg Oral QHS   polyethylene glycol  17 g Oral Daily   predniSONE  40 mg Oral Q breakfast   QUEtiapine  25 mg  Oral QHS    Assessment/ Plan:     58 y.o. male with a PMHx of obese male with history of seizure disorder, hypertension, congestive heart failure, status post pacemaker placement, obstructive sleep apnea, COPD was admitted with history of unresponsive episode.  He was initially intubated and sedated.  He was found to have pneumonia and was treated with antibiotics.    #1: Acute kidney injury: Acute kidney injury most likely secondary to hemodynamic compromise due to lack of perfusion versus ATN secondary to sepsis.  Baseline creatinine 0.79. Update: Creatinine continues to improve with adequate oral intake.  1.49 today.  Urine output of 1775 mL recorded.  Will continue to monitor.  Continue to avoid nephrotoxic agents and therapies.   #2:  Volume overload.  Patient appears to have good compensation at the moment.   Diuretics held.  Volume status improving.   #3: Sepsis/pneumonia.  Patient was previously on azithromycin.   4.  Acute respiratory failure.  Weaned 3L [place]   LOS: 7 [facility] 3/26/20251:02 PM

## 2023-05-15 NOTE — Plan of Care (Signed)
   Problem: Metabolic: Goal: Ability to maintain appropriate glucose levels will improve Outcome: Progressing

## 2023-05-15 NOTE — Progress Notes (Signed)
 Inpatient Rehabilitation Admissions Coordinator   No caregiver supports have been verified . Two voice mails have been left for his sister without return call and patient states he will not have 24/7 caregiver supports. Acute team and TOC made aware that other rehab venues should be pursued.  Ottie Glazier, RN, MSN Rehab Admissions Coordinator [phone] 05/15/2023 10:53 AM

## 2023-05-15 NOTE — Progress Notes (Addendum)
 PT Cancellation Note  Patient Details Name: Seth Tucker MRN: [id number] DOB: [birth year]   Cancelled Treatment:    Reason Eval/Treat Not Completed: Other (comment) Pt reports that he was up earlier this afternoon and did not want to do anything more today.  He acknowledges that it is important to work with PT but he continues to refuse despite offers for truncated session and further reasoning on need to increase activity.    Malachi Pro, DPT 05/15/2023, 3:18 PM

## 2023-05-15 NOTE — Progress Notes (Signed)
 Occupational Therapy Treatment Patient Details Name: Seth Tucker MRN: [id number] DOB: [birth year] Today's Date: 05/15/2023   History of present illness Pt is a 58 year old male admitted to ICU due to acute hypoxic/ hypercapnic respiratory failure secondary to possible seizure, right basilar pneumonia and suspected small para-pnemonic effusion  after being found unresponsive, requiring emergent intubation and mechanical ventilatory support, extubated 05/10/23;     PMH significant for Seizure disorder, HFpEF  SSS s/p PPM placement, Pulmonary Embolus on Eliquis, CAD, HLD, OSA   OT comments  Pt seen for OT tx this date, brother present and steps out of room for intervention session. Pt continues to present with decreased insight into deficits. Currently requires MIN A for bed mobility, MIN A with max multimodal cuing for hand placement to stand using RW, and MAX A for pericare in standing after incontinent BM. Returns to seated for rest break and PLB. On second attempt to stand, pt with sudden posterior LOB requiring MOD A to correct. Pt with poor righting reactions and stated he plans on going home alone soon. New gown donned with MIN A, and pt washes hands seated EOB with setup. Left in bed, alarm activated and needs in reach. Discharge recommendation appropriate - patient will benefit from continued inpatient follow up therapy, <3 hours/day. OT will continue to progress for functional gains.       If plan is discharge home, recommend the following:  A lot of help with bathing/dressing/bathroom;A lot of help with walking and/or transfers;Supervision due to cognitive status;Help with stairs or ramp for entrance   Equipment Recommendations  Other (comment)       Precautions / Restrictions Precautions Precautions: Fall Recall of Precautions/Restrictions: Impaired Restrictions Weight Bearing Restrictions Per Provider Order: No       Mobility Bed Mobility Overal bed mobility: Needs  Assistance Bed Mobility: Supine to Sit, Sit to Supine     Supine to sit: Min assist Sit to supine: Min assist, HOB elevated, Used rails   General bed mobility comments: increased time, cues for sequencing    Transfers Overall transfer level: Needs assistance Equipment used: Rolling walker (2 wheels) Transfers: Sit to/from Stand Sit to Stand: Min assist           General transfer comment: vc and tactile cues for hand placement during all STS transfers     Balance Overall balance assessment: Needs assistance Sitting-balance support: Feet supported, No upper extremity supported Sitting balance-Leahy Scale: Fair     Standing balance support: Bilateral upper extremity supported, Reliant on assistive device for balance, During functional activity Standing balance-Leahy Scale: Poor Standing balance comment: poor, pt with posterior LOB requiring MOD A to correct                           ADL either performed or assessed with clinical judgement   ADL Overall ADL's : Needs assistance/impaired     Grooming: Wash/dry hands;Wash/dry face;Sitting Grooming Details (indicate cue type and reason): seated EOB vcs for attn to task and for throughness         Upper Body Dressing : Minimal assistance;Sitting Upper Body Dressing Details (indicate cue type and reason): doffed and donned new gown Lower Body Dressing: Maximal assistance Lower Body Dressing Details (indicate cue type and reason): donn socks     Toileting- Clothing Manipulation and Hygiene: Maximal assistance;Sit to/from stand Toileting - Clothing Manipulation Details (indicate cue type and reason): requires maxA after incontient BM, modA for  standing balance using RW. pt attempts to use urinal in standing, OT re directs to perform seated     Functional mobility during ADLs: Minimal assistance;Rolling walker (2 wheels) General ADL Comments: Stands at bedside for ADL performance     Communication  Communication Communication: Impaired Factors Affecting Communication: Difficulty expressing self   Cognition Arousal: Alert Behavior During Therapy: WFL for tasks assessed/performed Cognition: No family/caregiver present to determine baseline, Cognition impaired   Orientation impairments: Time, Situation Awareness: Intellectual awareness impaired, Online awareness impaired Memory impairment (select all impairments): Short-term memory, Working memory Attention impairment (select first level of impairment): Sustained attention Executive functioning impairment (select all impairments): Reasoning, Problem solving OT - Cognition Comments: noted decreased insight into deficits,   pt telling this Thereasa Parkin that he is going home despite modA to correct posterior LOB                 Following commands: Impaired Following commands impaired: Follows one step commands with increased time      Cueing   Cueing Techniques: Verbal cues, Tactile cues, Visual cues        General Comments On 3L/min, increased SOB/WOB during task performance, increased to 4L. Weaned down to 3L end of session with SpO2% 95    Pertinent Vitals/ Pain       Pain Assessment Pain Assessment: No/denies pain   Frequency  Min 3X/week        Progress Toward Goals  OT Goals(current goals can now be found in the care plan section)  Progress towards OT goals: Progressing toward goals  Acute Rehab OT Goals OT Goal Formulation: With patient Time For Goal Achievement: 05/25/23 Potential to Achieve Goals: Poor ADL Goals Pt Will Perform Grooming: with modified independence;sitting Pt Will Perform Lower Body Dressing: with modified independence;sit to/from stand;sitting/lateral leans Pt Will Transfer to Toilet: with modified independence;ambulating Pt Will Perform Toileting - Clothing Manipulation and hygiene: with modified independence;sitting/lateral leans;sit to/from stand  Plan      AM-PAC OT "6 Clicks" Daily  Activity     Outcome Measure   Help from another person eating meals?: A Little Help from another person taking care of personal grooming?: A Little Help from another person toileting, which includes using toliet, bedpan, or urinal?: A Lot Help from another person bathing (including washing, rinsing, drying)?: A Lot Help from another person to put on and taking off regular upper body clothing?: A Little Help from another person to put on and taking off regular lower body clothing?: A Lot 6 Click Score: 15    End of Session Equipment Utilized During Treatment: Rolling walker (2 wheels);Oxygen;Gait belt  OT Visit Diagnosis: Other abnormalities of gait and mobility (R26.89);Muscle weakness (generalized) (M62.81);Cognitive communication deficit (R41.841)   Activity Tolerance Patient tolerated treatment well   Patient Left in bed;with call bell/phone within reach;with bed alarm set   Nurse Communication Mobility status        Time: 1400-1430 OT Time Calculation (min): 30 min  Charges: OT General Charges $OT Visit: 1 Visit OT Treatments $Self Care/Home Management : 23-37 mins  Derika Eckles L. Aristea Posada, OTR/L  05/15/23, 2:57 PM

## 2023-05-15 NOTE — Progress Notes (Signed)
 Progress Note   Patient: Seth Tucker WUJ:[id number] DOB: Sep 29, 1965 DOA: 05/08/2023     7 DOS: the patient was seen and examined on 05/15/2023      Brief hospital course:    From HPI "58 yo M presenting to [facility] ED from home via EMS for evaluation after being found unresponsive, hypoxic with a pulse.  Family reported the patient to be in his normal state of health last spoken to 2 days ago on 05/06/23 when he was headed to the [facility] for an annual check up. Sister reports he had no complaints. When asked about any issues with seizure activity, she said family was concerned that maybe he was continuing to have seizures. She stated the patient would describe falling asleep in his car and waking up 3 hours later with no memory of what occurred and that his face/head would be sore as if he had hit the steering wheel somehow. She believes he has only been taking Phenobarbital. She reports he is a current everyday smoker at about a pack a day, but is trying to quit with a 40 + pack year history. She denies ETOH or recreational drug use.   05/08/23: Admit to ICU due to acute hypoxic/ hypercapnic respiratory failure secondary to right basilar pneumonia and suspected small para-pnemonic effusion requiring emergent intubation and mechanical ventilatory support.  05/09/23-patient + for adenovirus on RVP.  Remains hypoxemic and acidemic on repeat blood gas eval.  He's on empiric abx and steroids.  05/10/23- patient weaning from [facility], on propofol weaning protocol for awakening and SBT. Blood culture with contaminant.  S/p extubation today. 05/11/23- patient awake alert , cursing at staff , asking for food wants to leave.  He does have component of altered mentation which may be metabolic in context of hyperammonimea and AKI.  Nephrology consultation is in process. Optimizing for TRH downgrade.  05/12/23- Patient tolerated BIPAP well overnight, mentation is lucid and oriented x 3.  Optimized for TRH  transferring to PCU today."  Assessment and Plan:   Acute respiratory failure with hypoxia and hypercapnea  due to Multifocal pneumonia COPD with acute exacerbation Adenovirus infection OSA on CPAP Required emergent intubation and mechanical ventilation in ICU. Extubated 3/21 to BiPAP Continue supplemental oxygen --Supplement O2 per protocol, target sats > 88% --Off antibiotics --Transition IV steroids >> PO Prednisone tomorrow AM --Scheduled and PRN Duonebs --Mucinex --IS and flutter --CPAP nightly / BiPAP as needed --Appears sputum culture not collected since 3/22 --Continuous pulse ox   Septic Shock - POA - resolved. --Monitor hemodynamics --Maintain MAP > 65   AKI - POA, Cr on admission 2.28, peaked at 3.62, baseline 0.79 Treated with IV fluids 3/24 -- Cr 2.03 improving  3/25 -- Cr 1.70 --Monitor BMP, electrolytes --Nephrology on board --Avoid nephrotoxins & hypotension   Chronic HFpEF Elevated troponin due to demand ischemia Hx of Hypertension Hx of CAD Hx of SSS s/p pacemaker Hyperlipidemia --Continue losartan --Continue holding Coreg, resume when appropriate -Continue Eliquis --telemetry monitoring --monitor volume status - daily weights, io's   Acute metabolic encephalopathy - due to hypercapnia on admission, septic shock. Resolved. --Delirium precautions --Mgmt of underlying conditions as outlined --Continue Seroquel at bedtime   History of PE on chronic anticoagulation --Currently on heparin drip --Resume Eliquis this evening   History of seizure disorder -- no apparent acute issues or known recent seizures. Follow-up EEG --Continue PTA phenobarbital --Follow pending phenobarb level   Transaminitis -- likely due to septic shock Pt without abdominal complaints. -  Continue to monitor liver function   Medication noncompliance --  Note that patient's medication history shows "patient not taking" all medications except phenobarbital and  albuterol. --Complicated overall care and prognosis long term --Raises risk of hospital readmissions     Subjective: Patient seen and examined at bedside this morning Currently requiring 3 L of intranasal oxygen Denies worsening shortness of breath chest pain Still continues to remain weak   Physical Exam: General exam: Alert and awake on intranasal oxygen HEENT: moist mucus membranes, hearing grossly normal  Respiratory system: diminished breath sounds, +wheezes, no rhonchi, normal respiratory effort on 4 l/min [place] O2. Cardiovascular system: normal S1/S2, RRR, trace BLE edema.   Gastrointestinal system: soft, NT, ND, no HSM felt, +bowel sounds. Central nervous system: A&O x 2+. no gross focal neurologic deficits, normal speech Skin: dry, intact, normal temperature Psychiatry: normal mood, congruent affect    Family Communication: None   Disposition: Status is: Inpatient Remains inpatient appropriate because: weaning oxygen      Planned Discharge Destination:  Acute inpatient rehab    Data Reviewed:    Latest Ref Rng & Units 05/15/2023    4:30 AM 05/14/2023    6:01 AM 05/13/2023    4:35 AM  BMP  Glucose 70 - 99 mg/dL 95  161  096   BUN 6 - 20 mg/dL 40  50  63   Creatinine 0.61 - 1.24 mg/dL 0.45  4.09  8.11   Sodium 135 - 145 mmol/L 143  142  140   Potassium 3.5 - 5.1 mmol/L 4.1  4.0  4.1   Chloride 98 - 111 mmol/L 112  109  105   CO2 22 - 32 mmol/L 24  26  25    Calcium 8.9 - 10.3 mg/dL 8.5  8.2  8.2        Latest Ref Rng & Units 05/15/2023    4:30 AM 05/14/2023    6:01 AM 05/13/2023    4:35 AM  CBC  WBC 4.0 - 10.5 K/uL 11.4  16.1  19.7   Hemoglobin 13.0 - 17.0 g/dL 91.4  78.2  95.6   Hematocrit 39.0 - 52.0 % 36.9  37.0  39.0   Platelets 150 - 400 K/uL 332  307  252     Vitals:   05/15/23 0447 05/15/23 0448 05/15/23 0732 05/15/23 1545  BP: (!) 142/71  136/70 (!) 156/84  Pulse: 72  77 85  Resp: 20  16 20   Temp: 98.3 F (36.8 C)  98.7 F (37.1 C) 99.1 F (37.3  C)  TempSrc: Axillary  Oral Oral  SpO2: 99%  93% 92%  Weight:  135.4 kg    Height:        Family Communication: Discussed with patient  Disposition: Pending rehab  Time spent: 42 minutes  Author: Loyce Dys, MD 05/15/2023 4:38 PM  For on call review [email or web address].

## 2023-05-16 DIAGNOSIS — J9601 Acute respiratory failure with hypoxia: Secondary | ICD-10-CM | POA: Diagnosis not present

## 2023-05-16 LAB — CBC WITH DIFFERENTIAL/PLATELET
Abs Immature Granulocytes: 0.28 10*3/uL — ABNORMAL HIGH (ref 0.00–0.07)
Basophils Absolute: 0 10*3/uL (ref 0.0–0.1)
Basophils Relative: 0 %
Eosinophils Absolute: 0.1 10*3/uL (ref 0.0–0.5)
Eosinophils Relative: 1 %
HCT: 36.6 % — ABNORMAL LOW (ref 39.0–52.0)
Hemoglobin: 11.7 g/dL — ABNORMAL LOW (ref 13.0–17.0)
Immature Granulocytes: 2 %
Lymphocytes Relative: 24 %
Lymphs Abs: 3.1 10*3/uL (ref 0.7–4.0)
MCH: 28.2 pg (ref 26.0–34.0)
MCHC: 32 g/dL (ref 30.0–36.0)
MCV: 88.2 fL (ref 80.0–100.0)
Monocytes Absolute: 1.2 10*3/uL — ABNORMAL HIGH (ref 0.1–1.0)
Monocytes Relative: 10 %
Neutro Abs: 8 10*3/uL — ABNORMAL HIGH (ref 1.7–7.7)
Neutrophils Relative %: 63 %
Platelets: 372 10*3/uL (ref 150–400)
RBC: 4.15 MIL/uL — ABNORMAL LOW (ref 4.22–5.81)
RDW: 14.9 % (ref 11.5–15.5)
WBC: 12.7 10*3/uL — ABNORMAL HIGH (ref 4.0–10.5)
nRBC: 0 % (ref 0.0–0.2)

## 2023-05-16 LAB — GLUCOSE, CAPILLARY
Glucose-Capillary: 112 mg/dL — ABNORMAL HIGH (ref 70–99)
Glucose-Capillary: 130 mg/dL — ABNORMAL HIGH (ref 70–99)
Glucose-Capillary: 183 mg/dL — ABNORMAL HIGH (ref 70–99)
Glucose-Capillary: 224 mg/dL — ABNORMAL HIGH (ref 70–99)
Glucose-Capillary: 92 mg/dL (ref 70–99)
Glucose-Capillary: 93 mg/dL (ref 70–99)
Glucose-Capillary: 97 mg/dL (ref 70–99)

## 2023-05-16 LAB — BASIC METABOLIC PANEL WITH GFR
Anion gap: 8 (ref 5–15)
BUN: 31 mg/dL — ABNORMAL HIGH (ref 6–20)
CO2: 25 mmol/L (ref 22–32)
Calcium: 8.4 mg/dL — ABNORMAL LOW (ref 8.9–10.3)
Chloride: 108 mmol/L (ref 98–111)
Creatinine, Ser: 1.44 mg/dL — ABNORMAL HIGH (ref 0.61–1.24)
GFR, Estimated: 56 mL/min — ABNORMAL LOW (ref >60)
Glucose, Bld: 93 mg/dL (ref 70–99)
Potassium: 4.3 mmol/L (ref 3.5–5.1)
Sodium: 141 mmol/L (ref 135–145)

## 2023-05-16 NOTE — Plan of Care (Signed)

## 2023-05-16 NOTE — Progress Notes (Signed)
 Nurse attempted to return the call from the sister from earlier today.  Nurse asked the the sister was the password for the patient was and the sister stated "peanut". Password noted on the account at this time is "smoke". Nurse informed the sister that that is incorrect and therefore nurse can not give any information to the sister.   Sister is very upset and states "who changed it". Nurse was not able to answer the question as it it does not show who updated it.  Sister then asked to be transferred to the patient. Nurse transferred the call to the patient's room.   Of note, nurse did inform the provider that sister would like an update as requested from the nightshift nurse.

## 2023-05-16 NOTE — Progress Notes (Signed)
 [facility]  PROGRESS NOTE   Subjective:   Patient seen sitting up in bed Breakfast tray at bedside Alert and oriented Denies nausea or vomiting   03/26 0701 - 03/27 0700 In: 240 [P.O.:240] Out: 1600 [Urine:1600] Lab Results  Component Value Date   CREATININE 1.44 (H) 05/16/2023   CREATININE 1.49 (H) 05/15/2023   CREATININE 1.70 (H) 05/14/2023     Objective:  Vital signs: Blood pressure (!) 152/72, pulse 79, temperature 98.6 F (37 C), resp. rate 17, height 5\' 10"  (1.778 m), weight 135.1 kg, SpO2 92%.  Intake/Output Summary (Last 24 hours) at 05/16/2023 1612 Last data filed at 05/16/2023 1100 Gross per 24 hour  Intake 240 ml  Output 1150 ml  Net -910 ml   Filed Weights   05/14/23 0500 05/15/23 0448 05/16/23 0500  Weight: 134.2 kg 135.4 kg 135.1 kg     Physical Exam: General:  No acute distress  Head:  Normocephalic, atraumatic. Moist oral mucosal membranes  Eyes:  Anicteric  Lungs:   Clear to auscultation, normal effort  Heart:  S1S2 no rubs  Abdomen:   Soft, nontender, bowel sounds present  Extremities: Trace peripheral edema.  Neurologic:  Awake, alert, following commands  Skin:  No lesions  Access: No hemodialysis access    Basic Metabolic Panel: Recent Labs  Lab 05/10/23 0411 05/11/23 1042 05/11/23 1043 05/12/23 0308 05/13/23 0435 05/14/23 0601 05/15/23 0430 05/16/23 0424  NA 141  --    < > 139 140 142 143 141  K 4.4  --    < > 3.9 4.1 4.0 4.1 4.3  CL 107  --    < > 106 105 109 112* 108  CO2 25  --    < > 24 25 26 24 25   GLUCOSE 208*  --    < > 109* 107* 112* 95 93  BUN 80*  --    < > 71* 63* 50* 40* 31*  CREATININE 3.62*  --    < > 2.59* 2.03* 1.70* 1.49* 1.44*  CALCIUM 7.4*  --    < > 7.8* 8.2* 8.2* 8.5* 8.4*  MG 2.9* 2.8*  --  2.9* 2.9* 2.5*  --   --   PHOS 2.8 1.5*  --  3.2 3.2 4.1 4.6  --    < > = values in this interval not displayed.   GFR: Estimated Creatinine Clearance: 76.4 mL/min (A) (by C-G formula based on SCr  of 1.44 mg/dL (H)).  Liver Function Tests: Recent Labs  Lab 05/11/23 1043 05/14/23 0601 05/15/23 0430  AST 54* 63*  --   ALT 35 65*  --   ALKPHOS 56 39  --   BILITOT 0.9 0.9  --   PROT 7.0 6.8  --   ALBUMIN 2.8* 2.4* 2.4*   No results for input(s): "LIPASE", "AMYLASE" in the last 168 hours. Recent Labs  Lab 05/14/23 0601  AMMONIA 41*    CBC: Recent Labs  Lab 05/11/23 1043 05/12/23 0308 05/13/23 0435 05/14/23 0601 05/15/23 0430 05/16/23 0424  WBC 16.2* 15.1* 19.7* 16.1* 11.4* 12.7*  NEUTROABS 10.2*  --   --   --   --  8.0*  HGB 14.1 13.8 12.8* 12.3* 11.8* 11.7*  HCT 41.6 40.5 39.0 37.0* 36.9* 36.6*  MCV 82.4 83.3 85.3 85.5 87.6 88.2  PLT 202 199 252 307 332 372     HbA1C: Hgb A1c MFr Bld  Date/Time Value Ref Range Status  05/09/2023 08:18 PM 6.6 (H) 4.8 -  5.6 % Final    Comment:    (NOTE) Pre diabetes:          5.7%-6.4%  Diabetes:              >6.4%  Glycemic control for   <7.0% adults with diabetes     Urinalysis: No results for input(s): "COLORURINE", "LABSPEC", "PHURINE", "GLUCOSEU", "HGBUR", "BILIRUBINUR", "KETONESUR", "PROTEINUR", "UROBILINOGEN", "NITRITE", "LEUKOCYTESUR" in the last 72 hours.  Invalid input(s): "APPERANCEUR"    Imaging: No results found.   Medications:      apixaban  5 mg Oral BID   Chlorhexidine Gluconate Cloth  6 each Topical Daily   dextromethorphan-guaiFENesin  1 tablet Oral BID   docusate  100 mg Oral BID   famotidine  20 mg Oral BID   feeding supplement  237 mL Oral TID BM   insulin aspart  0-15 Units Subcutaneous Q4H   ipratropium-albuterol  3 mL Nebulization TID   lactulose  30 g Oral TID   losartan  50 mg Oral Daily   multivitamin with minerals  1 tablet Oral Daily   nystatin  5 mL Oral QID   PHENObarbital  200 mg Oral QHS   polyethylene glycol  17 g Oral Daily   predniSONE  40 mg Oral Q breakfast   QUEtiapine  25 mg Oral QHS    Assessment/ Plan:     58 y.o. male with a PMHx of obese male with  history of seizure disorder, hypertension, congestive heart failure, status post pacemaker placement, obstructive sleep apnea, COPD was admitted with history of unresponsive episode.  He was initially intubated and sedated.  He was found to have pneumonia and was treated with antibiotics.    #1: Acute kidney injury: Acute kidney injury most likely secondary to hemodynamic compromise due to lack of perfusion versus ATN secondary to sepsis.  Baseline creatinine 0.79. Update: Creatinine appears to have stalled today, 1.44.  Adequate urine output noted.  Patient encouraged to maintain oral intake.  Continue to avoid nephrotoxic agents and therapies..   #2:  Volume overload.  Patient appears to have good compensation at the moment.   Diuretics held.  Fluid status stable.   #3: Sepsis/pneumonia.  Patient was previously on azithromycin.   4.  Acute respiratory failure.  Weaned 3L [place]  Due to consistent improvements with renal function, we will sign off at this time.   LOS: 8 [facility] 3/27/20254:12 PM

## 2023-05-16 NOTE — Progress Notes (Signed)
 Progress Note   Patient: Seth Tucker [id number]:[id number] DOB: 08-30-1965 DOA: 05/08/2023     8 DOS: the patient was seen and examined on 05/16/2023    Brief hospital course:     From HPI "58 yo M presenting to [facility] ED from home via EMS for evaluation after being found unresponsive, hypoxic with a pulse.  Family reported the patient to be in his normal state of health last spoken to 2 days ago on 05/06/23 when he was headed to the [facility] for an annual check up. Sister reports he had no complaints. When asked about any issues with seizure activity, she said family was concerned that maybe he was continuing to have seizures. She stated the patient would describe falling asleep in his car and waking up 3 hours later with no memory of what occurred and that his face/head would be sore as if he had hit the steering wheel somehow. She believes he has only been taking Phenobarbital. She reports he is a current everyday smoker at about a pack a day, but is trying to quit with a 40 + pack year history. She denies ETOH or recreational drug use.   05/08/23: Admit to ICU due to acute hypoxic/ hypercapnic respiratory failure secondary to right basilar pneumonia and suspected small para-pnemonic effusion requiring emergent intubation and mechanical ventilatory support.  05/09/23-patient + for adenovirus on RVP.  Remains hypoxemic and acidemic on repeat blood gas eval.  He's on empiric abx and steroids.  05/10/23- patient weaning from [facility], on propofol weaning protocol for awakening and SBT. Blood culture with contaminant.  S/p extubation today. 05/11/23- patient awake alert , cursing at staff , asking for food wants to leave.  He does have component of altered mentation which may be metabolic in context of hyperammonimea and AKI.  Nephrology consultation is in process. Optimizing for TRH downgrade.  05/12/23- Patient tolerated BIPAP well overnight, mentation is lucid and oriented x 3.  Optimized for TRH transferring  to PCU today."   Assessment and Plan:   Acute respiratory failure with hypoxia and hypercapnea  due to Multifocal pneumonia COPD with acute exacerbation Adenovirus infection OSA on CPAP Required emergent intubation and mechanical ventilation in ICU. Extubated 3/21 to BiPAP Continue supplemental oxygen --Supplement O2 per protocol, target sats > 88% --Off antibiotics Continue oral prednisone --Scheduled and PRN Duonebs Continue Mucinex --CPAP nightly / BiPAP as needed --Appears sputum culture not collected since 3/22 --Continuous pulse ox   Septic Shock - POA - resolved. --Monitor hemodynamics --Maintain MAP > 65   AKI - POA, Cr on admission 2.28, peaked at 3.62, baseline 0.79 Treated with IV fluids 3/24 -- Cr 2.03 improving  3/25 -- Cr 1.70 Nephrologist on board and case discussed Monitor renal function --Avoid nephrotoxins & hypotension   Chronic HFpEF Elevated troponin due to demand ischemia Hx of Hypertension Hx of CAD Hx of SSS s/p pacemaker Hyperlipidemia --Continue losartan --Continue holding Coreg, resume when appropriate Continue Eliquis --telemetry monitoring --monitor volume status - daily weights, io's   Acute metabolic encephalopathy - due to hypercapnia on admission, septic shock. Resolved. --Delirium precautions --Mgmt of underlying conditions as outlined --Continue Seroquel at bedtime   History of PE on chronic anticoagulation --Currently on heparin drip --Resume Eliquis this evening   History of seizure disorder -- no apparent acute issues or known recent seizures. Follow-up EEG --Continue PTA phenobarbital --Follow pending phenobarb level   Transaminitis -- likely due to septic shock Pt without abdominal complaints. -Continue to monitor liver  function   Medication noncompliance --  Note that patient's medication history shows "patient not taking" all medications except phenobarbital and albuterol. --Complicated overall care and  prognosis long term --Raises risk of hospital readmissions     Subjective: Patient seen and examined at bedside this morning Patient requiring 3 L of oxygen PT OT have recommended nursing facility placement [facility] is working on this  Physical Exam: General exam: Alert and awake on intranasal oxygen HEENT: moist mucus membranes, hearing grossly normal  Respiratory system: diminished breath sounds, +wheezes, no rhonchi, normal respiratory effort on 4 l/min [place] O2. Cardiovascular system: normal S1/S2, RRR, trace BLE edema.   Gastrointestinal system: soft, NT, ND, no HSM felt, +bowel sounds. Central nervous system: A&O x 2+. no gross focal neurologic deficits, normal speech Skin: dry, intact, normal temperature Psychiatry: normal mood, congruent affect     Family Communication: I called the patient since Dois Davenport as requested by the nursing staff.  She was unable to answer and I left her a voicemail message.   Disposition: Status is: Inpatient Remains inpatient appropriate because: weaning oxygen      Planned Discharge Destination:  Acute inpatient rehab     Data Reviewed:  Vitals:   05/16/23 0421 05/16/23 0500 05/16/23 0809 05/16/23 1718  BP: (!) 145/75  (!) 152/72 127/68  Pulse: 72  79 69  Resp: 18  17 17   Temp: 98 F (36.7 C)  98.6 F (37 C) 98.1 F (36.7 C)  TempSrc:      SpO2: 94%  92% 94%  Weight:  135.1 kg    Height:          Latest Ref Rng & Units 05/16/2023    4:24 AM 05/15/2023    4:30 AM 05/14/2023    6:01 AM  CBC  WBC 4.0 - 10.5 K/uL 12.7  11.4  16.1   Hemoglobin 13.0 - 17.0 g/dL 13.0  86.5  78.4   Hematocrit 39.0 - 52.0 % 36.6  36.9  37.0   Platelets 150 - 400 K/uL 372  332  307        Latest Ref Rng & Units 05/16/2023    4:24 AM 05/15/2023    4:30 AM 05/14/2023    6:01 AM  BMP  Glucose 70 - 99 mg/dL 93  95  696   BUN 6 - 20 mg/dL 31  40  50   Creatinine 0.61 - 1.24 mg/dL 2.95  2.84  1.32   Sodium 135 - 145 mmol/L 141  143  142   Potassium 3.5 - 5.1  mmol/L 4.3  4.1  4.0   Chloride 98 - 111 mmol/L 108  112  109   CO2 22 - 32 mmol/L 25  24  26    Calcium 8.9 - 10.3 mg/dL 8.4  8.5  8.2      Time spent: 41 minutes  Author: Loyce Dys, MD 05/16/2023 5:35 PM  For on call review [email or web address].

## 2023-05-16 NOTE — Progress Notes (Signed)
 SLP Cancellation Note  Patient Details Name: Seth Tucker MRN: [id number] DOB: November 19, 1965   Cancelled treatment:       Reason Eval/Treat Not Completed: Other (comment) Pt advanced to regular solids on 3/24. No reported issue with advanced diet reported or indicated in chart review. Pt denying concern for shortness of breath per MD note.  Recommend continued regular solids and thin liquids with general aspiration precautions (slow rate, small bites, elevated HOB, and alert for PO intake).  No follow up acute SLP services indicated.   [name], MS, CCC-SLP Speech Language Pathologist Rehab Services; [facility] [phone] (ascom)   [name] 05/16/2023, 1:27 PM

## 2023-05-16 NOTE — Progress Notes (Signed)
 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 He was sleeping, snoring on arrival to room but wakes up to voice. He is somewhat lethargic but able to follow single step commands with increased time. Increased independence with bed mobility this session. Standing performed with Min A from elevated bed height and reinforcement of safe technique. No knee buckling with standing. Heavy reliance on rolling walker for support. Pre-gait activity performed with side steps with CGA using the walker. Activity tolerance limited by fatigue and generalized weakness. Recommend to continue PT to maximize independence and facilitate return to prior level of function. Rehabilitation > 3 hours/day recommended after this hospital stay.    If plan is discharge home, recommend the following: Two people to help with walking and/or transfers;Two people to help with bathing/dressing/bathroom   Can travel by private vehicle        Equipment Recommendations  Rolling walker (2 wheels)    Recommendations for Other Services       Precautions / Restrictions Precautions Precautions: Fall Recall of Precautions/Restrictions: Impaired Restrictions Weight Bearing Restrictions Per Provider Order: No     Mobility  Bed Mobility Overal bed mobility: Needs Assistance Bed Mobility: Supine to Sit, Sit to Supine     Supine to sit: Mod assist Sit to  supine: Contact guard assist   General bed mobility comments: assistance for LE support. cues for task initiation    Transfers Overall transfer level: Needs assistance Equipment used: Rolling walker (2 wheels) Transfers: Sit to/from Stand Sit to Stand: Min assist, From elevated surface           General transfer comment: steadying assistance provided. patient is able to recall correct hand positioning with standing for safety with reinforcement provided    Ambulation/Gait             Pre-gait activities: patient able to take 3 side steps to the left with rolling walker with CGA. further activity limited by fatigue and generalized weakness     Stairs             Wheelchair Mobility     Tilt Bed    Modified Rankin (Stroke Patients Only)       Balance Overall balance assessment: Needs assistance Sitting-balance support: Feet supported Sitting balance-Leahy Scale: Fair     Standing balance support: Bilateral upper extremity supported Standing balance-Leahy Scale: Poor Standing balance comment: heavy reliance on rolling walker for support                            Communication Communication Communication: Impaired Factors Affecting Communication: Difficulty expressing self  Cognition Arousal: Lethargic Behavior During Therapy: WFL for tasks assessed/performed   PT - Cognitive impairments: Initiation                       PT - Cognition Comments: cues for task initiation. increased time and effort required with mobility Following commands:  Impaired Following commands impaired: Follows one step commands with increased time    Cueing Cueing Techniques: Verbal cues, Tactile cues  Exercises      General Comments General comments (skin integrity, edema, etc.): no dizziness reported with mobility. breath sounds are loud while awake and patient was snoring on arrival to room      Pertinent Vitals/Pain Pain Assessment Pain  Assessment: No/denies pain    Home Living                          Prior Function            PT Goals (current goals can now be found in the care plan section) Acute Rehab PT Goals Patient Stated Goal: to go home PT Goal Formulation: With patient Time For Goal Achievement: 05/25/23 Potential to Achieve Goals: Good Progress towards PT goals: Progressing toward goals    Frequency    Min 3X/week      PT Plan      Co-evaluation              AM-PAC PT "6 Clicks" Mobility   Outcome Measure  Help needed turning from your back to your side while in a flat bed without using bedrails?: A Lot Help needed moving from lying on your back to sitting on the side of a flat bed without using bedrails?: A Lot Help needed moving to and from a bed to a chair (including a wheelchair)?: A Lot Help needed standing up from a chair using your arms (e.g., wheelchair or bedside chair)?: A Lot Help needed to walk in hospital room?: A Lot Help needed climbing 3-5 steps with a railing? : Total 6 Click Score: 11    End of Session Equipment Utilized During Treatment: Oxygen Activity Tolerance: Patient limited by fatigue;Patient tolerated treatment well Patient left: in bed;with call bell/phone within reach;with bed alarm set Nurse Communication: Mobility status PT Visit Diagnosis: Muscle weakness (generalized) (M62.81);Difficulty in walking, not elsewhere classified (R26.2)     Time: 5621-3086 PT Time Calculation (min) (ACUTE ONLY): 17 min  Charges:    $Therapeutic Activity: 8-22 mins PT General Charges $$ ACUTE PT VISIT: 1 Visit                     Donna Bernard, PT, MPT    [name] 05/16/2023, 2:03 PM

## 2023-05-16 NOTE — TOC Progression Note (Signed)
 Transition of Care [facility]) - Progression Note    Patient Details  Name: Seth Tucker MRN: [id number] Date of Birth: 04/28/1965  Transition of Care [facility]) CM/SW Contact  Marlowe Sax, RN Phone Number: 05/16/2023, 12:45 PM  Clinical Narrative:     Patient has no care at home and will need to go to STR, Fl2 completed, PASSR obtained, Bedsearch sent       Expected Discharge Plan and Services                                               Social Determinants of Health (SDOH) Interventions SDOH Screenings   Food Insecurity: Patient Unable To Answer (05/09/2023)  Housing: Patient Unable To Answer (05/09/2023)  Transportation Needs: Patient Unable To Answer (05/09/2023)  Utilities: Patient Unable To Answer (05/09/2023)  Tobacco Use: High Risk (05/08/2023)    Readmission Risk Interventions     No data to display
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
## 2023-05-16 NOTE — Progress Notes (Cosign Needed)
 The above named patient is recommended to go to Short Term Rehab for strengthening and gait training for balance.  It is expected that the Short Term Rehab stay will be less than 30 days.  The patient is expected to return home after Rehab.

## 2023-05-16 NOTE — Plan of Care (Signed)
  Problem: Activity: Goal: Ability to tolerate increased activity will improve Outcome: Progressing   Problem: Safety: Goal: Ability to remain free from injury will improve Outcome: Progressing   Problem: Skin Integrity: Goal: Risk for impaired skin integrity will decrease Outcome: Progressing

## 2023-05-17 DIAGNOSIS — J9601 Acute respiratory failure with hypoxia: Secondary | ICD-10-CM | POA: Diagnosis not present

## 2023-05-17 LAB — CBC WITH DIFFERENTIAL/PLATELET
Abs Immature Granulocytes: 0.12 10*3/uL — ABNORMAL HIGH (ref 0.00–0.07)
Basophils Absolute: 0 10*3/uL (ref 0.0–0.1)
Basophils Relative: 0 %
Eosinophils Absolute: 0.1 10*3/uL (ref 0.0–0.5)
Eosinophils Relative: 1 %
HCT: 35.5 % — ABNORMAL LOW (ref 39.0–52.0)
Hemoglobin: 11.5 g/dL — ABNORMAL LOW (ref 13.0–17.0)
Immature Granulocytes: 1 %
Lymphocytes Relative: 22 %
Lymphs Abs: 2.6 10*3/uL (ref 0.7–4.0)
MCH: 28 pg (ref 26.0–34.0)
MCHC: 32.4 g/dL (ref 30.0–36.0)
MCV: 86.4 fL (ref 80.0–100.0)
Monocytes Absolute: 0.8 10*3/uL (ref 0.1–1.0)
Monocytes Relative: 7 %
Neutro Abs: 8 10*3/uL — ABNORMAL HIGH (ref 1.7–7.7)
Neutrophils Relative %: 69 %
Platelets: 401 10*3/uL — ABNORMAL HIGH (ref 150–400)
RBC: 4.11 MIL/uL — ABNORMAL LOW (ref 4.22–5.81)
RDW: 14.7 % (ref 11.5–15.5)
WBC: 11.5 10*3/uL — ABNORMAL HIGH (ref 4.0–10.5)
nRBC: 0 % (ref 0.0–0.2)

## 2023-05-17 LAB — BASIC METABOLIC PANEL WITH GFR
Anion gap: 9 (ref 5–15)
BUN: 32 mg/dL — ABNORMAL HIGH (ref 6–20)
CO2: 25 mmol/L (ref 22–32)
Calcium: 8.6 mg/dL — ABNORMAL LOW (ref 8.9–10.3)
Chloride: 107 mmol/L (ref 98–111)
Creatinine, Ser: 1.49 mg/dL — ABNORMAL HIGH (ref 0.61–1.24)
GFR, Estimated: 54 mL/min — ABNORMAL LOW (ref >60)
Glucose, Bld: 136 mg/dL — ABNORMAL HIGH (ref 70–99)
Potassium: 4.1 mmol/L (ref 3.5–5.1)
Sodium: 141 mmol/L (ref 135–145)

## 2023-05-17 LAB — GLUCOSE, CAPILLARY
Glucose-Capillary: 83 mg/dL (ref 70–99)
Glucose-Capillary: 99 mg/dL (ref 70–99)
Glucose-Capillary: 119 mg/dL — ABNORMAL HIGH (ref 70–99)
Glucose-Capillary: 154 mg/dL — ABNORMAL HIGH (ref 70–99)
Glucose-Capillary: 137 mg/dL — ABNORMAL HIGH (ref 70–99)

## 2023-05-17 MED ORDER — PREDNISONE 20 MG PO TABS
20.0000 mg | ORAL_TABLET | Freq: Every day | ORAL | Status: DC
  Administered 2023-05-18 – 2023-05-20 (×3): 20 mg via ORAL
  Filled 2023-05-17 (×4): qty 1

## 2023-05-17 NOTE — Progress Notes (Signed)
 Occupational Therapy Treatment Patient Details Name: Seth Tucker MRN: [id number] DOB: 1966/01/29 Today's Date: 05/17/2023   History of present illness Pt is a 58 year old male admitted to ICU due to acute hypoxic/ hypercapnic respiratory failure secondary to possible seizure, right basilar pneumonia and suspected small para-pnemonic effusion  after being found unresponsive, requiring emergent intubation and mechanical ventilatory support, extubated 05/10/23;     PMH significant for Seizure disorder, HFpEF  SSS s/p PPM placement, Pulmonary Embolus on Eliquis, CAD, HLD, OSA   OT comments  Pt received in recliner, agreeable to participation with encouragement. ADL session performed focusing on UB/LB bathing recliner from STS. Mild dyspnea upon exertion, OT provides edu and cuing for PLB techniques. Performs grooming tasks with setup and cues for thoroughness, vcs to don [place] after removal to wash face. MIN A for UB/LB bathing, able to stand with CGA using RW and MIN A for dynamic standing balance. Pt making good progress towards goals, discharge recommendation appropriate. Patient will benefit from intensive inpatient follow-up therapy, >3 hours/day       If plan is discharge home, recommend the following:  A lot of help with bathing/dressing/bathroom;A lot of help with walking and/or transfers;Supervision due to cognitive status;Help with stairs or ramp for entrance   Equipment Recommendations  Other (comment) (defer)       Precautions / Restrictions Precautions Precautions: Fall Recall of Precautions/Restrictions: Impaired Restrictions Weight Bearing Restrictions Per Provider Order: No       Mobility Bed Mobility               General bed mobility comments: NT, in recliner pre and post session    Transfers Overall transfer level: Needs assistance Equipment used: Rolling walker (2 wheels) Transfers: Sit to/from Stand Sit to Stand: Contact guard assist           General  transfer comment: 2 standing bouts performed with cues for technique     Balance Overall balance assessment: Needs assistance Sitting-balance support: Feet supported Sitting balance-Leahy Scale: Fair     Standing balance support: Bilateral upper extremity supported Standing balance-Leahy Scale: Fair Standing balance comment: able to perform LB bathing with alternating unilateral hand placement on RW for posterior                           ADL either performed or assessed with clinical judgement   ADL Overall ADL's : Needs assistance/impaired     Grooming: Wash/dry hands;Wash/dry face;Sitting Grooming Details (indicate cue type and reason): recliner, setup, vcs to self-perform Upper Body Bathing: Minimal assistance;Sitting Upper Body Bathing Details (indicate cue type and reason): vcs and min A for throughness Lower Body Bathing: Minimal assistance;Sit to/from stand Lower Body Bathing Details (indicate cue type and reason): cues for sequencing, minA for standing balance using RW Upper Body Dressing : Minimal assistance;Sitting Upper Body Dressing Details (indicate cue type and reason): doffed and donned new gown                 Functional mobility during ADLs: Minimal assistance;Rolling walker (2 wheels) General ADL Comments: UB/LB sponge bath seated in recliner     Communication Communication Communication: Impaired Factors Affecting Communication: Difficulty expressing self   Cognition Arousal: Alert Behavior During Therapy: Flat affect               OT - Cognition Comments: noted decreased insight into deficits, requires encourgement to participate  Following commands: Impaired Following commands impaired: Follows one step commands with increased time      Cueing   Cueing Techniques: Verbal cues, Tactile cues        General Comments mild dyspnea upon exertion, on 4L, O2 sats 92%> with activity, 94% at rest     Pertinent Vitals/ Pain       Pain Assessment Pain Assessment: No/denies pain         Frequency  Min 3X/week        Progress Toward Goals  OT Goals(current goals can now be found in the care plan section)  Progress towards OT goals: Progressing toward goals  Acute Rehab OT Goals OT Goal Formulation: With patient Time For Goal Achievement: 05/25/23 Potential to Achieve Goals: Poor ADL Goals Pt Will Perform Grooming: with modified independence;sitting Pt Will Perform Lower Body Dressing: with modified independence;sit to/from stand;sitting/lateral leans Pt Will Transfer to Toilet: with modified independence;ambulating Pt Will Perform Toileting - Clothing Manipulation and hygiene: with modified independence;sitting/lateral leans;sit to/from stand  Plan         AM-PAC OT "6 Clicks" Daily Activity     Outcome Measure   Help from another person eating meals?: A Little Help from another person taking care of personal grooming?: A Little Help from another person toileting, which includes using toliet, bedpan, or urinal?: A Lot Help from another person bathing (including washing, rinsing, drying)?: A Lot Help from another person to put on and taking off regular upper body clothing?: A Little Help from another person to put on and taking off regular lower body clothing?: A Lot 6 Click Score: 15    End of Session Equipment Utilized During Treatment: Rolling walker (2 wheels);Oxygen  OT Visit Diagnosis: Other abnormalities of gait and mobility (R26.89);Muscle weakness (generalized) (M62.81);Cognitive communication deficit (R41.841)   Activity Tolerance Patient tolerated treatment well   Patient Left in chair;with call bell/phone within reach;with chair alarm set   Nurse Communication Mobility status        Time: 1505-1530 OT Time Calculation (min): 25 min  Charges: OT General Charges $OT Visit: 1 Visit OT Treatments $Self Care/Home Management : 23-37  mins  Alexandru Moorer L. Teancum Brule, OTR/L  05/17/23, 3:54 PM

## 2023-05-17 NOTE — Progress Notes (Signed)
 Nutrition Follow-up  DOCUMENTATION CODES:   Morbid obesity  INTERVENTION:   -MVI with minerals daily -D/c Ensure Enlive po BID, each supplement provides 350 kcal and 20 grams of protein.  -Magic cup TID with meals, each supplement provides 290 kcal and 9 grams of protein   NUTRITION DIAGNOSIS:   Inadequate oral intake related to inability to eat (pt sedated and ventilated) as evidenced by NPO status.  Progressing; advanced to PO diet on 05/11/23  GOAL:   Patient will meet greater than or equal to 90% of their needs  Progressing   MONITOR:   PO intake, Supplement acceptance, Labs, Weight trends, I & O's, Skin  REASON FOR ASSESSMENT:   Consult Enteral/tube feeding initiation and management  ASSESSMENT:   58 y/o male with h/o COPD, HLD, depression, OSA, CAD, PE, HTN, CHF, seizures, PVD, pacemaker and lymphedema who is admitted with COPD exacerbation, sepsis, AKI, PNA and SIRS.  3/21- extubated 3/22- s/p BSE- dysphagia 3 diet with thin liquids 3/23- s/p BSE- dysphagia 3 diet with thin liquids 3/24- s/p BSE- advanced to regular diet with thin liqiuds  Reviewed I/O's: -1.4 L x 24 hours and -4.4 L since admission  UOP: 1.4 L x 24 hours  Pt unavailable at time of visit.   Pt remains on a regular diet. Pt with improved oral intake. Noted meal completions 50-100%. Pt has been refusing Ensure supplements.   Wt has been stable since admission.   Medications reviewed ad include colace, lactulose, miralax, and prednisone.   Per TOC notes, plan for SNF placement at discharge; working on placement. CIR placement unable to be pursued due to lack of 24/7 support at discharge.   Labs reviewed: CBGS: 83-224 (inpatient orders for glycemic control are 0-15 units insulin aspart every 4 hours).    Diet Order:   Diet Order             Diet regular Room service appropriate? Yes; Fluid consistency: Thin  Diet effective now                   EDUCATION NEEDS:   No education  needs have been identified at this time  Skin:  Skin Assessment: Reviewed RN Assessment  Last BM:  05/13/23  Height:   Ht Readings from Last 1 Encounters:  05/08/23 5\' 10"  (1.778 m)    Weight:   Wt Readings from Last 1 Encounters:  05/17/23 134.6 kg    Ideal Body Weight:  75.45 kg  BMI:  Body mass index is 42.58 kg/m.  Estimated Nutritional Needs:   Kcal:  4098-1191  Protein:  115-130 grams  Fluid:  > 2 L    Levada Schilling, RD, LDN, CDCES Registered Dietitian III Certified Diabetes Care and Education Specialist If unable to reach this RD, please use "RD Inpatient" group chat on secure chat between hours of 8am-4 pm daily

## 2023-05-17 NOTE — Plan of Care (Signed)
  Problem: Education: Goal: Knowledge of General Education information will improve Description Including pain rating scale, medication(s)/side effects and non-pharmacologic comfort measures Outcome: Progressing   Problem: Clinical Measurements: Goal: Will remain free from infection Outcome: Progressing Goal: Cardiovascular complication will be avoided Outcome: Progressing   Problem: Activity: Goal: Risk for activity intolerance will decrease Outcome: Progressing

## 2023-05-17 NOTE — TOC Progression Note (Signed)
 Transition of Care [facility]) - Progression Note    Patient Details  Name: [name] MRN: [id number] Date of Birth: 09-11-1965  [facility] CM/SW Contact  Marlowe Sax, RN Phone Number: 05/17/2023, 12:01 PM  Clinical Narrative:     No bed offers to go to STR at this time, resent the bedsearch       Expected Discharge Plan and Services                                               Social Determinants of Health (SDOH) Interventions SDOH Screenings   Food Insecurity: Patient Unable To Answer (05/09/2023)  Housing: Patient Unable To Answer (05/09/2023)  Transportation Needs: Patient Unable To Answer (05/09/2023)  Utilities: Patient Unable To Answer (05/09/2023)  Tobacco Use: High Risk (05/08/2023)    Readmission Risk Interventions     No data to display

## 2023-05-17 NOTE — Progress Notes (Signed)
 PT Cancellation Note  Patient Details Name: Seth Tucker MRN: [id number] DOB: 05/16/1965   Cancelled Treatment:    Reason Eval/Treat Not Completed: Other (comment) (Patient has 2 visitors at the bedside and is requesting PT follow up later. Will follow up)  Donna Bernard, PT, MPT  [name] 05/17/2023, 1:10 PM

## 2023-05-17 NOTE — Plan of Care (Signed)
  Problem: Activity: Goal: Ability to tolerate increased activity will improve Outcome: Progressing   Problem: Clinical Measurements: Goal: Diagnostic test results will improve Outcome: Progressing   Problem: Activity: Goal: Risk for activity intolerance will decrease Outcome: Progressing   Problem: Nutrition: Goal: Adequate nutrition will be maintained Outcome: Progressing   Problem: Coping: Goal: Level of anxiety will decrease Outcome: Progressing   Problem: Coping: Goal: Ability to adjust to condition or change in health will improve Outcome: Progressing

## 2023-05-17 NOTE — Progress Notes (Signed)
 Progress Note   Patient: Seth Tucker YQM:[id number] DOB: 04-May-1965 DOA: 05/08/2023     9 DOS: the patient was seen and examined on 05/17/2023    Brief hospital course:     From HPI "58 yo M presenting to [facility] ED from home via EMS for evaluation after being found unresponsive, hypoxic with a pulse.  Family reported the patient to be in his normal state of health last spoken to 2 days ago on 05/06/23 when he was headed to the [facility] for an annual check up. Sister reports he had no complaints. When asked about any issues with seizure activity, she said family was concerned that maybe he was continuing to have seizures. She stated the patient would describe falling asleep in his car and waking up 3 hours later with no memory of what occurred and that his face/head would be sore as if he had hit the steering wheel somehow. She believes he has only been taking Phenobarbital. She reports he is a current everyday smoker at about a pack a day, but is trying to quit with a 40 + pack year history. She denies ETOH or recreational drug use.   05/08/23: Admit to ICU due to acute hypoxic/ hypercapnic respiratory failure secondary to right basilar pneumonia and suspected small para-pnemonic effusion requiring emergent intubation and mechanical ventilatory support.  05/09/23-patient + for adenovirus on RVP.  Remains hypoxemic and acidemic on repeat blood gas eval.  He's on empiric abx and steroids.  05/10/23- patient weaning from [facility], on propofol weaning protocol for awakening and SBT. Blood culture with contaminant.  S/p extubation today. 05/11/23- patient awake alert , cursing at staff , asking for food wants to leave.  He does have component of altered mentation which may be metabolic in context of hyperammonimea and AKI.  Nephrology consultation is in process. Optimizing for TRH downgrade.  05/12/23- Patient tolerated BIPAP well overnight, mentation is lucid and oriented x 3.  Optimized for TRH transferring  to PCU today."   Assessment and Plan:   Acute respiratory failure with hypoxia and hypercapnea  due to Multifocal pneumonia COPD with acute exacerbation Adenovirus infection OSA on CPAP Required emergent intubation and mechanical ventilation in ICU. Extubated 3/21 to BiPAP Continue supplemental oxygen --Supplement O2 per protocol, target sats > 88% Has completed antibiotics Continue oral prednisone taper --Scheduled and PRN Duonebs Continue Mucinex --CPAP nightly / BiPAP as needed --Appears sputum culture not collected since 3/22 --Continuous pulse ox   Septic Shock - POA - resolved. --Monitor hemodynamics --Maintain MAP > 65   AKI - POA, Cr on admission 2.28, peaked at 3.62, baseline 0.79 Treated with IV fluids 3/24 -- Cr 2.03 improving  3/25 -- Cr 1.70 Nephrologist on board and case discussed Continue to monitor renal function --Avoid nephrotoxins & hypotension   Chronic HFpEF Elevated troponin due to demand ischemia Hx of Hypertension Hx of CAD Hx of SSS s/p pacemaker Hyperlipidemia Continue losartan Continue holding Coreg, resume when appropriate Continue Eliquis --telemetry monitoring --monitor volume status - daily weights, io's   Acute metabolic encephalopathy - due to hypercapnia on admission, septic shock. Resolved. --Delirium precautions --Mgmt of underlying conditions as outlined --Continue Seroquel at bedtime   History of PE on chronic anticoagulation --Currently on heparin drip --Resume Eliquis this evening   History of seizure disorder -- no apparent acute issues or known recent seizures. Follow-up EEG --Continue PTA phenobarbital --Follow pending phenobarb level   Transaminitis -- likely due to septic shock Pt without abdominal complaints. -  Continue to monitor liver function   Medication noncompliance --  Note that patient's medication history shows "patient not taking" all medications except phenobarbital and albuterol. --Complicated  overall care and prognosis long term --Raises risk of hospital readmissions     Subjective: Patient seen and examined at bedside this morning Denies worsening respiratory function Denies nausea vomiting chest pain or cough   Physical Exam: General exam: Alert and awake on intranasal oxygen HEENT: moist mucus membranes, hearing grossly normal  Respiratory system: Diminished breath sounds especially at the bases Cardiovascular system: normal S1/S2, RRR, trace BLE edema.   Gastrointestinal system: soft, NT, ND, no HSM felt, +bowel sounds. Central nervous system: A&O x 2+. no gross focal neurologic deficits, normal speech Skin: dry, intact, normal temperature Psychiatry: normal mood, congruent affect     Family Communication: I called the patient since Dois Davenport as requested by the nursing staff.  She was unable to answer and I left her a voicemail message.   Disposition: Status is: Inpatient Remains inpatient appropriate because: weaning oxygen      Planned Discharge Destination:  Acute inpatient rehab     Data Reviewed:    Vitals:   05/17/23 0311 05/17/23 0500 05/17/23 0847 05/17/23 1539  BP: 139/78  133/67 121/62  Pulse: 70  77 73  Resp: 18  16 18   Temp: 99.2 F (37.3 C)  98.1 F (36.7 C) 98.5 F (36.9 C)  TempSrc:      SpO2: 96%  92% 94%  Weight:  134.6 kg    Height:          Latest Ref Rng & Units 05/17/2023   10:17 AM 05/16/2023    4:24 AM 05/15/2023    4:30 AM  CBC  WBC 4.0 - 10.5 K/uL 11.5  12.7  11.4   Hemoglobin 13.0 - 17.0 g/dL 40.3  47.4  25.9   Hematocrit 39.0 - 52.0 % 35.5  36.6  36.9   Platelets 150 - 400 K/uL 401  372  332        Latest Ref Rng & Units 05/17/2023   10:17 AM 05/16/2023    4:24 AM 05/15/2023    4:30 AM  BMP  Glucose 70 - 99 mg/dL 563  93  95   BUN 6 - 20 mg/dL 32  31  40   Creatinine 0.61 - 1.24 mg/dL 8.75  6.43  3.29   Sodium 135 - 145 mmol/L 141  141  143   Potassium 3.5 - 5.1 mmol/L 4.1  4.3  4.1   Chloride 98 - 111 mmol/L 107   108  112   CO2 22 - 32 mmol/L 25  25  24    Calcium 8.9 - 10.3 mg/dL 8.6  8.4  8.5      Author: Loyce Dys, MD 05/17/2023 4:06 PM  For on call review [email or web address].

## 2023-05-17 NOTE — Progress Notes (Signed)
 Physical Therapy Treatment Patient Details Name: Seth Tucker MRN: [id number] DOB: 10-28-1965 Today's Date: 05/17/2023   History of Present Illness Pt is a 58 year old male admitted to ICU due to acute hypoxic/ hypercapnic respiratory failure secondary to possible seizure, right basilar pneumonia and suspected small para-pnemonic effusion  after being found unresponsive, requiring emergent intubation and mechanical ventilatory support, extubated 05/10/23;     PMH significant for Seizure disorder, HFpEF  SSS s/p PPM placement, Pulmonary Embolus on Eliquis, CAD, HLD, OSA    PT Comments  Patient is agreeable to PT session. Increased activity tolerance this session. Patient able to stand twice from elevated bed height with CGA. Two walking bouts performed performed, 65ft and 20ft, with mild dyspnea with exertion. Recommend to continue PT to maximize independence and facilitate return to prior level of function. Rehabilitation > 3 hours/day recommended after this hospital stay.    If plan is discharge home, recommend the following: A little help with walking and/or transfers;A little help with bathing/dressing/bathroom;Assist for transportation;Help with stairs or ramp for entrance   Can travel by private vehicle        Equipment Recommendations  Rolling walker (2 wheels)    Recommendations for Other Services       Precautions / Restrictions Precautions Precautions: Fall Recall of Precautions/Restrictions: Impaired Restrictions Weight Bearing Restrictions Per Provider Order: No     Mobility  Bed Mobility Overal bed mobility: Needs Assistance Bed Mobility: Supine to Sit     Supine to sit: Contact guard     General bed mobility comments: cues for technique    Transfers Overall transfer level: Needs assistance Equipment used: Rolling walker (2 wheels) Transfers: Sit to/from Stand Sit to Stand: Contact guard assist, From elevated surface           General transfer comment:  2 standing bouts performed with cues for technique    Ambulation/Gait Ambulation/Gait assistance: Contact guard assist Gait Distance (Feet):  (67ft, 10ft) Assistive device: Rolling walker (2 wheels) Gait Pattern/deviations: Step-through pattern Gait velocity: decreased     General Gait Details: verbal cues for safety using rolling walker. 2 bout of walking performed with seated rest break between bouts   Stairs             Wheelchair Mobility     Tilt Bed    Modified Rankin (Stroke Patients Only)       Balance Overall balance assessment: Needs assistance Sitting-balance support: Feet supported Sitting balance-Leahy Scale: Fair     Standing balance support: Bilateral upper extremity supported Standing balance-Leahy Scale: Fair Standing balance comment: heavy reliance on rolling walker for support                            Communication Communication Communication: Impaired  Cognition Arousal: Alert Behavior During Therapy: WFL for tasks assessed/performed   PT - Cognitive impairments: No apparent impairments                         Following commands: Impaired Following commands impaired: Follows one step commands with increased time    Cueing Cueing Techniques: Verbal cues, Tactile cues  Exercises      General Comments General comments (skin integrity, edema, etc.): mild dyspnea with exertion      Pertinent Vitals/Pain Pain Assessment Pain Assessment: Faces Faces Pain Scale: Hurts a little bit Pain Location: L knee Pain Descriptors / Indicators: Discomfort Pain Intervention(s): Limited activity  within patient's tolerance, Monitored during session, Repositioned    Home Living                          Prior Function            PT Goals (current goals can now be found in the care plan section) Acute Rehab PT Goals Patient Stated Goal: to go home PT Goal Formulation: With patient Time For Goal Achievement:  05/25/23 Potential to Achieve Goals: Good Progress towards PT goals: Progressing toward goals    Frequency    Min 3X/week      PT Plan      Co-evaluation              AM-PAC PT "6 Clicks" Mobility   Outcome Measure  Help needed turning from your back to your side while in a flat bed without using bedrails?: A Little Help needed moving from lying on your back to sitting on the side of a flat bed without using bedrails?: A Little Help needed moving to and from a bed to a chair (including a wheelchair)?: A Lot Help needed standing up from a chair using your arms (e.g., wheelchair or bedside chair)?: A Lot Help needed to walk in hospital room?: A Little Help needed climbing 3-5 steps with a railing? : A Lot 6 Click Score: 15    End of Session Equipment Utilized During Treatment: Oxygen Activity Tolerance: Patient limited by fatigue Patient left: in chair;with call bell/phone within reach;with chair alarm set Nurse Communication: Mobility status PT Visit Diagnosis: Muscle weakness (generalized) (M62.81);Difficulty in walking, not elsewhere classified (R26.2)     Time: 1430-1456 PT Time Calculation (min) (ACUTE ONLY): 26 min  Charges:    $Therapeutic Activity: 23-37 mins PT General Charges $$ ACUTE PT VISIT: 1 Visit                    Donna Bernard, PT, MPT    [name] 05/17/2023, 3:31 PM

## 2023-05-18 DIAGNOSIS — J9601 Acute respiratory failure with hypoxia: Secondary | ICD-10-CM | POA: Diagnosis not present

## 2023-05-18 LAB — CBC WITH DIFFERENTIAL/PLATELET
Abs Immature Granulocytes: 0.09 10*3/uL — ABNORMAL HIGH (ref 0.00–0.07)
Basophils Absolute: 0 10*3/uL (ref 0.0–0.1)
Basophils Relative: 0 %
Eosinophils Absolute: 0.1 10*3/uL (ref 0.0–0.5)
Eosinophils Relative: 1 %
HCT: 34.3 % — ABNORMAL LOW (ref 39.0–52.0)
Hemoglobin: 11.2 g/dL — ABNORMAL LOW (ref 13.0–17.0)
Immature Granulocytes: 1 %
Lymphocytes Relative: 29 %
Lymphs Abs: 3.7 10*3/uL (ref 0.7–4.0)
MCH: 28.1 pg (ref 26.0–34.0)
MCHC: 32.7 g/dL (ref 30.0–36.0)
MCV: 86.2 fL (ref 80.0–100.0)
Monocytes Absolute: 1 10*3/uL (ref 0.1–1.0)
Monocytes Relative: 8 %
Neutro Abs: 7.9 10*3/uL — ABNORMAL HIGH (ref 1.7–7.7)
Neutrophils Relative %: 61 %
Platelets: 389 10*3/uL (ref 150–400)
RBC: 3.98 MIL/uL — ABNORMAL LOW (ref 4.22–5.81)
RDW: 14.6 % (ref 11.5–15.5)
WBC: 12.8 10*3/uL — ABNORMAL HIGH (ref 4.0–10.5)
nRBC: 0 % (ref 0.0–0.2)

## 2023-05-18 LAB — GLUCOSE, CAPILLARY
Glucose-Capillary: 104 mg/dL — ABNORMAL HIGH (ref 70–99)
Glucose-Capillary: 104 mg/dL — ABNORMAL HIGH (ref 70–99)
Glucose-Capillary: 112 mg/dL — ABNORMAL HIGH (ref 70–99)
Glucose-Capillary: 133 mg/dL — ABNORMAL HIGH (ref 70–99)
Glucose-Capillary: 153 mg/dL — ABNORMAL HIGH (ref 70–99)
Glucose-Capillary: 89 mg/dL (ref 70–99)

## 2023-05-18 LAB — BASIC METABOLIC PANEL WITH GFR
Anion gap: 6 (ref 5–15)
BUN: 36 mg/dL — ABNORMAL HIGH (ref 6–20)
CO2: 26 mmol/L (ref 22–32)
Calcium: 8.6 mg/dL — ABNORMAL LOW (ref 8.9–10.3)
Chloride: 106 mmol/L (ref 98–111)
Creatinine, Ser: 1.36 mg/dL — ABNORMAL HIGH (ref 0.61–1.24)
GFR, Estimated: 60 mL/min — ABNORMAL LOW (ref >60)
Glucose, Bld: 94 mg/dL (ref 70–99)
Potassium: 4.6 mmol/L (ref 3.5–5.1)
Sodium: 138 mmol/L (ref 135–145)

## 2023-05-18 NOTE — Progress Notes (Signed)
 Progress Note   Patient: Seth Tucker ZOX:[id number] DOB: [birth year] DOA: 05/08/2023     10 DOS: the patient was seen and examined on 05/18/2023   Brief hospital course:     From HPI "57 yo M presenting to [facility] ED from home via EMS for evaluation after being found unresponsive, hypoxic with a pulse.  Family reported the patient to be in his normal state of health last spoken to 2 days ago on 05/06/23 when he was headed to the [facility] for an annual check up. Sister reports he had no complaints. When asked about any issues with seizure activity, she said family was concerned that maybe he was continuing to have seizures. She stated the patient would describe falling asleep in his car and waking up 3 hours later with no memory of what occurred and that his face/head would be sore as if he had hit the steering wheel somehow. She believes he has only been taking Phenobarbital. She reports he is a current everyday smoker at about a pack a day, but is trying to quit with a 40 + pack year history. She denies ETOH or recreational drug use.   05/08/23: Admit to ICU due to acute hypoxic/ hypercapnic respiratory failure secondary to right basilar pneumonia and suspected small para-pnemonic effusion requiring emergent intubation and mechanical ventilatory support.  05/09/23-patient + for adenovirus on RVP.  Remains hypoxemic and acidemic on repeat blood gas eval.  He's on empiric abx and steroids.  05/10/23- patient weaning from [facility], on propofol weaning protocol for awakening and SBT. Blood culture with contaminant.  S/p extubation today. 05/11/23- patient awake alert , cursing at staff , asking for food wants to leave.  He does have component of altered mentation which may be metabolic in context of hyperammonimea and AKI.  Nephrology consultation is in process. Optimizing for TRH downgrade.  05/12/23- Patient tolerated BIPAP well overnight, mentation is lucid and oriented x 3.  Optimized for TRH transferring  to PCU today."   Assessment and Plan:   Acute respiratory failure with hypoxia and hypercapnea  due to Multifocal pneumonia COPD with acute exacerbation Adenovirus infection OSA on CPAP Required emergent intubation and mechanical ventilation in ICU. Extubated 3/21 to BiPAP Continue supplemental oxygen --Supplement O2 per protocol, target sats > 88% Has completed antibiotics Continue oral prednisone taper --Scheduled and PRN Duonebs Continue Mucinex --CPAP nightly / BiPAP as needed --Appears sputum culture not collected since 3/22 --Continuous pulse ox   Septic Shock - POA - resolved. --Monitor hemodynamics --Maintain MAP > 65   AKI - POA, Cr on admission 2.28, peaked at 3.62, baseline 0.79 Treated with IV fluids 3/24 -- Cr 2.03 improving  3/25 -- Cr 1.70 Nephrologist on board and case discussed Continue to monitor renal function --Avoid nephrotoxins & hypotension   Chronic HFpEF Elevated troponin due to demand ischemia Hx of Hypertension Hx of CAD Hx of SSS s/p pacemaker Hyperlipidemia Continue losartan Continue holding Coreg, resume when appropriate Continue Eliquis --telemetry monitoring --monitor volume status - daily weights, io's   Acute metabolic encephalopathy - due to hypercapnia on admission, septic shock. Resolved. --Delirium precautions --Mgmt of underlying conditions as outlined --Continue Seroquel at bedtime   History of PE on chronic anticoagulation --Currently on heparin drip --Resume Eliquis this evening   History of seizure disorder -- no apparent acute issues or known recent seizures. Follow-up EEG --Continue PTA phenobarbital --Follow pending phenobarb level   Transaminitis -- likely due to septic shock Pt without abdominal complaints. -Continue  to monitor liver function   Medication noncompliance --  Note that patient's medication history shows "patient not taking" all medications except phenobarbital and albuterol. --Complicated  overall care and prognosis long term --Raises risk of hospital readmissions     Subjective: Patient seen and examined at bedside this morning Denies nausea vomiting abdominal pain or chest pain   Physical Exam: General exam: Alert and awake on intranasal oxygen HEENT: moist mucus membranes, hearing grossly normal  Respiratory system: Diminished breath sounds especially at the bases Cardiovascular system: normal S1/S2, RRR, trace BLE edema.   Gastrointestinal system: soft, NT, ND, no HSM felt, +bowel sounds. Central nervous system: A&O x 2+. no gross focal neurologic deficits, normal speech Skin: dry, intact, normal temperature Psychiatry: normal mood, congruent affect     Family Communication: I called the patient since Dois Davenport as requested by the nursing staff.  She was unable to answer and I left her a voicemail message.   Disposition: Status is: Inpatient Remains inpatient appropriate because: weaning oxygen      Planned Discharge Destination:  Acute inpatient rehab     Data Reviewed:   Vitals:   05/18/23 0357 05/18/23 0500 05/18/23 0946 05/18/23 1530  BP: (!) 161/86  (!) 149/76 (!) 151/75  Pulse: 70  74 74  Resp: 18  20 18   Temp: 97.9 F (36.6 C)  97.9 F (36.6 C) 98.6 F (37 C)  TempSrc:      SpO2: 92%  93% 98%  Weight:  135.2 kg    Height:          Latest Ref Rng & Units 05/18/2023    3:59 AM 05/17/2023   10:17 AM 05/16/2023    4:24 AM  CBC  WBC 4.0 - 10.5 K/uL 12.8  11.5  12.7   Hemoglobin 13.0 - 17.0 g/dL 40.9  81.1  91.4   Hematocrit 39.0 - 52.0 % 34.3  35.5  36.6   Platelets 150 - 400 K/uL 389  401  372        Latest Ref Rng & Units 05/18/2023    3:59 AM 05/17/2023   10:17 AM 05/16/2023    4:24 AM  BMP  Glucose 70 - 99 mg/dL 94  782  93   BUN 6 - 20 mg/dL 36  32  31   Creatinine 0.61 - 1.24 mg/dL 9.56  2.13  0.86   Sodium 135 - 145 mmol/L 138  141  141   Potassium 3.5 - 5.1 mmol/L 4.6  4.1  4.3   Chloride 98 - 111 mmol/L 106  107  108   CO2 22 -  32 mmol/L 26  25  25    Calcium 8.9 - 10.3 mg/dL 8.6  8.6  8.4      Author: Loyce Dys, MD 05/18/2023 3:58 PM  For on call review [email or web address].

## 2023-05-18 NOTE — Plan of Care (Signed)
  Problem: Activity: Goal: Ability to tolerate increased activity will improve Outcome: Progressing   Problem: Respiratory: Goal: Ability to maintain a clear airway and adequate ventilation will improve Outcome: Progressing   Problem: Role Relationship: Goal: Method of communication will improve Outcome: Progressing   Problem: Education: Goal: Knowledge of General Education information will improve Description: Including pain rating scale, medication(s)/side effects and non-pharmacologic comfort measures Outcome: Progressing   Problem: Health Behavior/Discharge Planning: Goal: Ability to manage health-related needs will improve Outcome: Progressing   Problem: Clinical Measurements: Goal: Ability to maintain clinical measurements within normal limits will improve Outcome: Progressing Goal: Will remain free from infection Outcome: Progressing Goal: Diagnostic test results will improve Outcome: Progressing Goal: Respiratory complications will improve Outcome: Progressing Goal: Cardiovascular complication will be avoided Outcome: Progressing   Problem: Activity: Goal: Risk for activity intolerance will decrease Outcome: Progressing   Problem: Nutrition: Goal: Adequate nutrition will be maintained Outcome: Progressing   Problem: Coping: Goal: Level of anxiety will decrease Outcome: Progressing   Problem: Elimination: Goal: Will not experience complications related to bowel motility Outcome: Progressing Goal: Will not experience complications related to urinary retention Outcome: Progressing   Problem: Pain Managment: Goal: General experience of comfort will improve and/or be controlled Outcome: Progressing   Problem: Safety: Goal: Ability to remain free from injury will improve Outcome: Progressing   Problem: Skin Integrity: Goal: Risk for impaired skin integrity will decrease Outcome: Progressing   Problem: Education: Goal: Ability to describe self-care  measures that may prevent or decrease complications (Diabetes Survival Skills Education) will improve Outcome: Progressing Goal: Individualized Educational Video(s) Outcome: Progressing   Problem: Coping: Goal: Ability to adjust to condition or change in health will improve Outcome: Progressing   Problem: Fluid Volume: Goal: Ability to maintain a balanced intake and output will improve Outcome: Progressing   Problem: Health Behavior/Discharge Planning: Goal: Ability to identify and utilize available resources and services will improve Outcome: Progressing Goal: Ability to manage health-related needs will improve Outcome: Progressing   Problem: Metabolic: Goal: Ability to maintain appropriate glucose levels will improve Outcome: Progressing   Problem: Nutritional: Goal: Maintenance of adequate nutrition will improve Outcome: Progressing Goal: Progress toward achieving an optimal weight will improve Outcome: Progressing   Problem: Skin Integrity: Goal: Risk for impaired skin integrity will decrease Outcome: Progressing   Problem: Tissue Perfusion: Goal: Adequacy of tissue perfusion will improve Outcome: Progressing

## 2023-05-19 DIAGNOSIS — J9601 Acute respiratory failure with hypoxia: Secondary | ICD-10-CM | POA: Diagnosis not present

## 2023-05-19 LAB — CBC WITH DIFFERENTIAL/PLATELET
Abs Immature Granulocytes: 0.05 10*3/uL (ref 0.00–0.07)
Basophils Absolute: 0 10*3/uL (ref 0.0–0.1)
Basophils Relative: 0 %
Eosinophils Absolute: 0.1 10*3/uL (ref 0.0–0.5)
Eosinophils Relative: 1 %
HCT: 35.5 % — ABNORMAL LOW (ref 39.0–52.0)
Hemoglobin: 11.3 g/dL — ABNORMAL LOW (ref 13.0–17.0)
Immature Granulocytes: 0 %
Lymphocytes Relative: 28 %
Lymphs Abs: 3.3 10*3/uL (ref 0.7–4.0)
MCH: 28.4 pg (ref 26.0–34.0)
MCHC: 31.8 g/dL (ref 30.0–36.0)
MCV: 89.2 fL (ref 80.0–100.0)
Monocytes Absolute: 0.9 10*3/uL (ref 0.1–1.0)
Monocytes Relative: 7 %
Neutro Abs: 7.7 10*3/uL (ref 1.7–7.7)
Neutrophils Relative %: 64 %
Platelets: 390 10*3/uL (ref 150–400)
RBC: 3.98 MIL/uL — ABNORMAL LOW (ref 4.22–5.81)
RDW: 14.2 % (ref 11.5–15.5)
WBC: 12 10*3/uL — ABNORMAL HIGH (ref 4.0–10.5)
nRBC: 0 % (ref 0.0–0.2)

## 2023-05-19 LAB — BASIC METABOLIC PANEL WITH GFR
Anion gap: 5 (ref 5–15)
BUN: 32 mg/dL — ABNORMAL HIGH (ref 6–20)
CO2: 25 mmol/L (ref 22–32)
Calcium: 8.4 mg/dL — ABNORMAL LOW (ref 8.9–10.3)
Chloride: 106 mmol/L (ref 98–111)
Creatinine, Ser: 1.37 mg/dL — ABNORMAL HIGH (ref 0.61–1.24)
GFR, Estimated: 59 mL/min — ABNORMAL LOW (ref >60)
Glucose, Bld: 122 mg/dL — ABNORMAL HIGH (ref 70–99)
Potassium: 4.4 mmol/L (ref 3.5–5.1)
Sodium: 136 mmol/L (ref 135–145)

## 2023-05-19 LAB — GLUCOSE, CAPILLARY
Glucose-Capillary: 140 mg/dL — ABNORMAL HIGH (ref 70–99)
Glucose-Capillary: 140 mg/dL — ABNORMAL HIGH (ref 70–99)
Glucose-Capillary: 178 mg/dL — ABNORMAL HIGH (ref 70–99)
Glucose-Capillary: 86 mg/dL (ref 70–99)
Glucose-Capillary: 96 mg/dL (ref 70–99)
Glucose-Capillary: 99 mg/dL (ref 70–99)

## 2023-05-19 MED ORDER — QUETIAPINE FUMARATE 25 MG PO TABS
25.0000 mg | ORAL_TABLET | Freq: Every day | ORAL | 1 refills | Status: DC
Start: 2023-05-19 — End: 2023-06-27

## 2023-05-19 MED ORDER — POLYETHYLENE GLYCOL 3350 17 G PO PACK: 17.0000 g | PACK | Freq: Every day | ORAL | 0 refills | Status: DC

## 2023-05-19 MED ORDER — DM-GUAIFENESIN ER 30-600 MG PO TB12: 1.0000 | ORAL_TABLET | Freq: Two times a day (BID) | ORAL | 0 refills | Status: DC

## 2023-05-19 MED ORDER — PREDNISONE 20 MG PO TABS: ORAL_TABLET | ORAL | 0 refills | Status: AC

## 2023-05-19 NOTE — Plan of Care (Signed)
  Problem: Activity: Goal: Ability to tolerate increased activity will improve Outcome: Progressing   Problem: Respiratory: Goal: Ability to maintain a clear airway and adequate ventilation will improve Outcome: Progressing   Problem: Role Relationship: Goal: Method of communication will improve Outcome: Progressing   Problem: Education: Goal: Knowledge of General Education information will improve Description: Including pain rating scale, medication(s)/side effects and non-pharmacologic comfort measures Outcome: Progressing   Problem: Health Behavior/Discharge Planning: Goal: Ability to manage health-related needs will improve Outcome: Progressing   Problem: Clinical Measurements: Goal: Ability to maintain clinical measurements within normal limits will improve Outcome: Progressing Goal: Will remain free from infection Outcome: Progressing Goal: Diagnostic test results will improve Outcome: Progressing Goal: Respiratory complications will improve Outcome: Progressing Goal: Cardiovascular complication will be avoided Outcome: Progressing   Problem: Activity: Goal: Risk for activity intolerance will decrease Outcome: Progressing   Problem: Nutrition: Goal: Adequate nutrition will be maintained Outcome: Progressing   Problem: Coping: Goal: Level of anxiety will decrease Outcome: Progressing   Problem: Elimination: Goal: Will not experience complications related to bowel motility Outcome: Progressing Goal: Will not experience complications related to urinary retention Outcome: Progressing   Problem: Pain Managment: Goal: General experience of comfort will improve and/or be controlled Outcome: Progressing   Problem: Safety: Goal: Ability to remain free from injury will improve Outcome: Progressing   Problem: Skin Integrity: Goal: Risk for impaired skin integrity will decrease Outcome: Progressing   Problem: Education: Goal: Ability to describe self-care  measures that may prevent or decrease complications (Diabetes Survival Skills Education) will improve Outcome: Progressing Goal: Individualized Educational Video(s) Outcome: Progressing   Problem: Coping: Goal: Ability to adjust to condition or change in health will improve Outcome: Progressing   Problem: Fluid Volume: Goal: Ability to maintain a balanced intake and output will improve Outcome: Progressing   Problem: Health Behavior/Discharge Planning: Goal: Ability to identify and utilize available resources and services will improve Outcome: Progressing Goal: Ability to manage health-related needs will improve Outcome: Progressing   Problem: Metabolic: Goal: Ability to maintain appropriate glucose levels will improve Outcome: Progressing   Problem: Nutritional: Goal: Maintenance of adequate nutrition will improve Outcome: Progressing Goal: Progress toward achieving an optimal weight will improve Outcome: Progressing   Problem: Skin Integrity: Goal: Risk for impaired skin integrity will decrease Outcome: Progressing   Problem: Tissue Perfusion: Goal: Adequacy of tissue perfusion will improve Outcome: Progressing

## 2023-05-19 NOTE — Progress Notes (Signed)
 Progress Note   Patient: Seth Tucker ZOX:[id number] DOB: 1965-09-25 DOA: 05/08/2023     11 DOS: the patient was seen and examined on 05/19/2023     Brief hospital course:     From HPI "58 yo M presenting to [facility] ED from home via EMS for evaluation after being found unresponsive, hypoxic with a pulse.  Family reported the patient to be in his normal state of health last spoken to 2 days ago on 05/06/23 when he was headed to the [facility] for an annual check up. Sister reports he had no complaints. When asked about any issues with seizure activity, she said family was concerned that maybe he was continuing to have seizures. She stated the patient would describe falling asleep in his car and waking up 3 hours later with no memory of what occurred and that his face/head would be sore as if he had hit the steering wheel somehow. She believes he has only been taking Phenobarbital. She reports he is a current everyday smoker at about a pack a day, but is trying to quit with a 40 + pack year history. She denies ETOH or recreational drug use.  On arrival patient was admitted to ICU due to acute hypoxic/ hypercapnic respiratory failure secondary to right basilar pneumonia and suspected small para-pnemonic effusion requiring emergent intubation and mechanical ventilatory support.  Tested positive for adenovirus.  Patient extubated on 05/10/2023. Also found to have AKI requiring nephrology is improved to have now signed off.  Rest of hospital course as outlined below.   Assessment and Plan:   Acute respiratory failure with hypoxia and hypercapnea  due to Multifocal pneumonia COPD with acute exacerbation Adenovirus infection OSA on CPAP Required emergent intubation and mechanical ventilation in ICU. Extubated 3/21 to BiPAP Currently requiring 2 L of intranasal oxygen Underwent a walk test that shows patient needs 2 L of oxygen with ambulation Has completed antibiotics Continue oral prednisone  taper Scheduled and PRN Duonebs Continue Mucinex Continue CPAP nightly / BiPAP as needed    Septic Shock - POA - resolved. Continue to monitor   AKI - POA, Cr on admission 2.28, peaked at 3.62, baseline 0.79 Treated with IV fluid Patient was seen by nephrology who have signed off now Continue to monitor renal function --Avoid nephrotoxins & hypotension   Chronic HFpEF Elevated troponin due to demand ischemia Hx of Hypertension Hx of CAD Hx of SSS s/p pacemaker Hyperlipidemia Continue losartan Continue holding Coreg, resume when appropriate Continue Eliquis Monitor input and output Continue to monitor daily weights   Acute metabolic encephalopathy - due to hypercapnia on admission, septic shock. Resolved. Continue delirium precaution --Mgmt of underlying conditions as outlined --Continue Seroquel at bedtime   History of PE on chronic anticoagulation Continue Eliquis   History of seizure disorder -- no apparent acute issues or known recent seizures. Follow-up EEG Continue phenobarbital   Transaminitis -- likely due to septic shock Pt without abdominal complaints. -Continue to monitor liver function   Medication noncompliance --  Note that patient's medication history shows "patient not taking" all medications except phenobarbital and albuterol. --Complicated overall care and prognosis long term Patient with high risks of readmission     Subjective: Patient initially was planned for discharge today however upon discussion with him as well as his sister this is an unsafe discharge. He is requiring 2 L of oxygen with ambulation He denied worsening shortness of breath chest pain or cough   Physical Exam: General exam: Alert and  awake on intranasal oxygen HEENT: moist mucus membranes, hearing grossly normal  Respiratory system: Diminished breath sounds especially at the bases Cardiovascular system: normal S1/S2, RRR, trace BLE edema.   Gastrointestinal system:  soft, NT, ND, no HSM felt, +bowel sounds. Central nervous system: A&O x 2+. no gross focal neurologic deficits, normal speech.  Does not know the date or the president Skin: dry, intact, normal temperature Psychiatry: normal mood, congruent affect     Family Communication: I have discussed with patient's sister Patient's living condition is very poor and is unsafe discharge at this time Disposition: TOC working on rehab  Status is: Inpatient Remains inpatient appropriate because: weaning oxygen      Planned Discharge Destination:  Acute inpatient rehab     Data Reviewed:    Vitals:   05/18/23 2051 05/19/23 0423 05/19/23 0500 05/19/23 0829  BP:  126/89  134/71  Pulse:  76  68  Resp:  19  18  Temp:  97.9 F (36.6 C)  98 F (36.7 C)  TempSrc:      SpO2: 97% 97%  100%  Weight:   134.5 kg   Height:          Latest Ref Rng & Units 05/19/2023    4:54 AM 05/18/2023    3:59 AM 05/17/2023   10:17 AM  CBC  WBC 4.0 - 10.5 K/uL 12.0  12.8  11.5   Hemoglobin 13.0 - 17.0 g/dL 16.1  09.6  04.5   Hematocrit 39.0 - 52.0 % 35.5  34.3  35.5   Platelets 150 - 400 K/uL 390  389  401        Latest Ref Rng & Units 05/19/2023    4:54 AM 05/18/2023    3:59 AM 05/17/2023   10:17 AM  BMP  Glucose 70 - 99 mg/dL 409  94  811   BUN 6 - 20 mg/dL 32  36  32   Creatinine 0.61 - 1.24 mg/dL 9.14  7.82  9.56   Sodium 135 - 145 mmol/L 136  138  141   Potassium 3.5 - 5.1 mmol/L 4.4  4.6  4.1   Chloride 98 - 111 mmol/L 106  106  107   CO2 22 - 32 mmol/L 25  26  25    Calcium 8.9 - 10.3 mg/dL 8.4  8.6  8.6     Disposition: Medically ready pending rehab  Author: Loyce Dys, MD 05/19/2023 2:27 PM  For on call review [email or web address].

## 2023-05-19 NOTE — Progress Notes (Signed)
 SATURATION QUALIFICATIONS: (This note is used to comply with regulatory documentation for home oxygen)  Patient Saturations on Room Air at Rest = 92%  Patient Saturations on Room Air while Ambulating = 88%  Patient Saturations on 2 Liters of oxygen while Ambulating = 92%  Please briefly explain why patient needs home oxygen: so he can get up and go to the bathroom or to get dressed

## 2023-05-19 NOTE — TOC Transition Note (Addendum)
 Transition of Care [facility]) - Discharge Note   Patient Details  Name: Seth Tucker MRN: [id number] Date of Birth: 28-Mar-1965  Transition of [facility]) CM/SW Contact:  Liliana Cline, LCSW Phone Number: 05/19/2023, 12:33 PM   Clinical Narrative:    CSW was notified by MD that patient wants to discharge home, no PT needs, does need home o2. CSW spoke with patient. Patient states he lives alone, but his brother and sister will provide support as needed. Sister Dois Davenport to transport home. Patient states he does not want Home Health, SNF, CIR, or OPPT. Patient states he feels safe returning home. Patient agrees to home oxygen being arranged. Referral made to Ada with [employer], she is aware of DC home today pending oxygen delivery.  2:50- MD reports the family is concerned about the safety about patient returning home. Inquired about competency. MD states patient is not competent to decide about DC plan - will continue to look for SNF. Still no bed offers as of today. Asked Ada with [employer] to cancel o2.   Final next level of care: Home/Self Care Barriers to Discharge: Barriers Resolved   Patient Goals and CMS Choice Patient states their goals for this hospitalization and ongoing recovery are:: home, declines HH, PT, OT, SNF, CIR CMS Medicare.gov Compare Post Acute Care list provided to:: Patient Choice offered to / list presented to : Patient      Discharge Placement                Patient to be transferred to facility by: sister Dois Davenport to transport Name of family member notified: patient Patient and family notified of of transfer: 05/19/23  Discharge Plan and Services Additional resources added to the After Visit Summary for                  DME Arranged: Oxygen DME Agency: [employer] Date DME Agency Contacted: 05/19/23   Representative spoke with at DME Agency: Christella Scheuermann            Social Drivers of Health (SDOH) Interventions SDOH Screenings   Food Insecurity: Patient  Unable To Answer (05/09/2023)  Housing: Patient Unable To Answer (05/09/2023)  Transportation Needs: Patient Unable To Answer (05/09/2023)  Utilities: Patient Unable To Answer (05/09/2023)  Tobacco Use: High Risk (05/08/2023)     Readmission Risk Interventions     No data to display

## 2023-05-19 NOTE — Plan of Care (Signed)
  Problem: Activity: Goal: Ability to tolerate increased activity will improve Outcome: Progressing   Problem: Respiratory: Goal: Ability to maintain a clear airway and adequate ventilation will improve Outcome: Progressing   Problem: Role Relationship: Goal: Method of communication will improve Outcome: Progressing   Problem: Education: Goal: Knowledge of General Education information will improve Description: Including pain rating scale, medication(s)/side effects and non-pharmacologic comfort measures Outcome: Progressing   Problem: Health Behavior/Discharge Planning: Goal: Ability to manage health-related needs will improve Outcome: Progressing

## 2023-05-19 NOTE — Progress Notes (Signed)
  Progress Note   Patient: Seth Tucker [id number] DOB: [birth year] DOA: 05/08/2023     11 DOS: the patient was seen and examined on 05/19/2023   Patient underwent a walk test today by nursing staff with below mentioned findings:  "SATURATION QUALIFICATIONS: (This note is used to comply with regulatory documentation for home oxygen)   Patient Saturations on Room Air at Rest = 92%   Patient Saturations on Room Air while Ambulating = 88%   Patient Saturations on 2 Liters of oxygen while Ambulating = 92%   Please briefly explain why patient needs home oxygen: so he can get up and go to the bathroom or to get dressed"   Author: Loyce Dys, MD 05/19/2023 12:44 PM  For on call review [email or web address].

## 2023-05-20 DIAGNOSIS — J9601 Acute respiratory failure with hypoxia: Secondary | ICD-10-CM | POA: Diagnosis not present

## 2023-05-20 LAB — CBC WITH DIFFERENTIAL/PLATELET
Abs Immature Granulocytes: 0.06 10*3/uL (ref 0.00–0.07)
Basophils Absolute: 0.1 10*3/uL (ref 0.0–0.1)
Basophils Relative: 0 %
Eosinophils Absolute: 0.1 10*3/uL (ref 0.0–0.5)
Eosinophils Relative: 1 %
HCT: 36.6 % — ABNORMAL LOW (ref 39.0–52.0)
Hemoglobin: 12 g/dL — ABNORMAL LOW (ref 13.0–17.0)
Immature Granulocytes: 1 %
Lymphocytes Relative: 23 %
Lymphs Abs: 3 10*3/uL (ref 0.7–4.0)
MCH: 28.2 pg (ref 26.0–34.0)
MCHC: 32.8 g/dL (ref 30.0–36.0)
MCV: 85.9 fL (ref 80.0–100.0)
Monocytes Absolute: 1 10*3/uL (ref 0.1–1.0)
Monocytes Relative: 8 %
Neutro Abs: 8.8 10*3/uL — ABNORMAL HIGH (ref 1.7–7.7)
Neutrophils Relative %: 67 %
Platelets: 388 10*3/uL (ref 150–400)
RBC: 4.26 MIL/uL (ref 4.22–5.81)
RDW: 14.1 % (ref 11.5–15.5)
WBC: 13 10*3/uL — ABNORMAL HIGH (ref 4.0–10.5)
nRBC: 0 % (ref 0.0–0.2)

## 2023-05-20 LAB — BASIC METABOLIC PANEL WITH GFR
Anion gap: 8 (ref 5–15)
BUN: 30 mg/dL — ABNORMAL HIGH (ref 6–20)
CO2: 23 mmol/L (ref 22–32)
Calcium: 8.8 mg/dL — ABNORMAL LOW (ref 8.9–10.3)
Chloride: 105 mmol/L (ref 98–111)
Creatinine, Ser: 1.18 mg/dL (ref 0.61–1.24)
GFR, Estimated: >60 mL/min (ref >60)
Glucose, Bld: 83 mg/dL (ref 70–99)
Potassium: 4.4 mmol/L (ref 3.5–5.1)
Sodium: 136 mmol/L (ref 135–145)

## 2023-05-20 LAB — GLUCOSE, CAPILLARY
Glucose-Capillary: 109 mg/dL — ABNORMAL HIGH (ref 70–99)
Glucose-Capillary: 115 mg/dL — ABNORMAL HIGH (ref 70–99)
Glucose-Capillary: 121 mg/dL — ABNORMAL HIGH (ref 70–99)
Glucose-Capillary: 127 mg/dL — ABNORMAL HIGH (ref 70–99)
Glucose-Capillary: 90 mg/dL (ref 70–99)

## 2023-05-20 MED ORDER — IPRATROPIUM-ALBUTEROL 0.5-2.5 (3) MG/3ML IN SOLN
3.0000 mL | Freq: Two times a day (BID) | RESPIRATORY_TRACT | Status: DC
Start: 2023-05-20 — End: 2023-05-20

## 2023-05-20 NOTE — TOC Progression Note (Signed)
 Transition of Care [facility]) - Progression Note    Patient Details  Name: Seth Tucker MRN: [id number] Date of Birth: 07/26/1965  Transition of Care [facility]) CM/SW Contact  Marlowe Sax, RN Phone Number: 05/20/2023, 4:18 PM  Clinical Narrative:    No bed offers available, resent the bed search     Barriers to Discharge: Barriers Resolved  Expected Discharge Plan and Services         Expected Discharge Date: 05/19/23               DME Arranged: Oxygen DME Agency: [employer] Date DME Agency Contacted: 05/19/23   Representative spoke with at DME Agency: Christella Scheuermann             Social Determinants of Health (SDOH) Interventions SDOH Screenings   Food Insecurity: Patient Unable To Answer (05/09/2023)  Housing: Patient Unable To Answer (05/09/2023)  Transportation Needs: Patient Unable To Answer (05/09/2023)  Utilities: Patient Unable To Answer (05/09/2023)  Tobacco Use: High Risk (05/08/2023)    Readmission Risk Interventions     No data to display

## 2023-05-20 NOTE — Plan of Care (Signed)
  Problem: Activity: Goal: Ability to tolerate increased activity will improve Outcome: Progressing   Problem: Respiratory: Goal: Ability to maintain a clear airway and adequate ventilation will improve Outcome: Progressing   Problem: Role Relationship: Goal: Method of communication will improve Outcome: Progressing   Problem: Education: Goal: Knowledge of General Education information will improve Description: Including pain rating scale, medication(s)/side effects and non-pharmacologic comfort measures Outcome: Progressing   Problem: Health Behavior/Discharge Planning: Goal: Ability to manage health-related needs will improve Outcome: Progressing   Problem: Clinical Measurements: Goal: Ability to maintain clinical measurements within normal limits will improve Outcome: Progressing Goal: Will remain free from infection Outcome: Progressing Goal: Diagnostic test results will improve Outcome: Progressing Goal: Respiratory complications will improve Outcome: Progressing Goal: Cardiovascular complication will be avoided Outcome: Progressing   Problem: Activity: Goal: Risk for activity intolerance will decrease Outcome: Progressing   Problem: Nutrition: Goal: Adequate nutrition will be maintained Outcome: Progressing   Problem: Coping: Goal: Level of anxiety will decrease Outcome: Progressing   Problem: Elimination: Goal: Will not experience complications related to bowel motility Outcome: Progressing Goal: Will not experience complications related to urinary retention Outcome: Progressing   Problem: Pain Managment: Goal: General experience of comfort will improve and/or be controlled Outcome: Progressing   Problem: Safety: Goal: Ability to remain free from injury will improve Outcome: Progressing   Problem: Skin Integrity: Goal: Risk for impaired skin integrity will decrease Outcome: Progressing   Problem: Education: Goal: Ability to describe self-care  measures that may prevent or decrease complications (Diabetes Survival Skills Education) will improve Outcome: Progressing Goal: Individualized Educational Video(s) Outcome: Progressing   Problem: Coping: Goal: Ability to adjust to condition or change in health will improve Outcome: Progressing   Problem: Fluid Volume: Goal: Ability to maintain a balanced intake and output will improve Outcome: Progressing   Problem: Health Behavior/Discharge Planning: Goal: Ability to identify and utilize available resources and services will improve Outcome: Progressing Goal: Ability to manage health-related needs will improve Outcome: Progressing   Problem: Metabolic: Goal: Ability to maintain appropriate glucose levels will improve Outcome: Progressing   Problem: Nutritional: Goal: Maintenance of adequate nutrition will improve Outcome: Progressing Goal: Progress toward achieving an optimal weight will improve Outcome: Progressing   Problem: Skin Integrity: Goal: Risk for impaired skin integrity will decrease Outcome: Progressing   Problem: Tissue Perfusion: Goal: Adequacy of tissue perfusion will improve Outcome: Progressing

## 2023-05-20 NOTE — Discharge Summary (Signed)
 Physician Discharge Summary   Patient: Seth Tucker MRN: [id number] DOB: November 25, 1965  Admit date:     05/08/2023  Discharge date: 05/20/23  Discharge Physician: Loyce Dys   PCP: [facility]   Recommendations at discharge:  Follow-up with PCP  Discharge Diagnoses: Acute respiratory failure with hypoxia and hypercapnea  due to Multifocal pneumonia COPD with acute exacerbation Adenovirus infection OSA on CPAP Septic Shock - POA - resolved AKI Chronic HFpEF Elevated troponin due to demand ischemia Hx of Hypertension Hx of CAD Hx of SSS s/p pacemaker Hyperlipidemia Acute metabolic encephalopathy - due to hypercapnia on History of PE on chronic anticoagulation History of seizure disorder -- no apparent acute issues or known Transaminitis -- likely due to septic shock Medication noncompliance --    Hospital Course:  58 yo M presenting to [facility] ED from home via EMS for evaluation after being found unresponsive, hypoxic with a pulse.  Family reported the patient to be in his normal state of health last spoken to 2 days ago on 05/06/23 when he was headed to the [facility] for an annual check up. Sister reports he had no complaints. When asked about any issues with seizure activity, she said family was concerned that maybe he was continuing to have seizures. She stated the patient would describe falling asleep in his car and waking up 3 hours later with no memory of what occurred and that his face/head would be sore as if he had hit the steering wheel somehow. She believes he has only been taking Phenobarbital. She reports he is a current everyday smoker at about a pack a day, but is trying to quit with a 40 + pack year history. She denies ETOH or recreational drug use.  On arrival patient was admitted to ICU due to acute hypoxic/ hypercapnic respiratory failure secondary to right basilar pneumonia and suspected small para-pnemonic effusion requiring emergent  intubation and mechanical ventilatory support.  Tested positive for adenovirus.  Patient extubated on 05/10/2023. Also found to have AKI requiring nephrology is improved to have now signed off she was seen by PT OT and was planned for rehab placement however he declined.  This was discussed extensively with patient as well as patient's sister and he decided to leave AGAINST MEDICAL ADVICE.  Patient was alert and oriented able to make his own decision.  Consultants: None Procedures performed: None Disposition: Home Diet recommendation:  Cardiac diet DISCHARGE MEDICATION: Allergies as of 05/20/2023       Reactions   Bee Venom Anaphylaxis, Swelling   Hornet Venom Shortness Of Breath, Swelling   Influenza Vaccines Other (See Comments)   Seizures   Peanuts [peanut Oil] Anaphylaxis   Phenytoin Hives, Rash   Valproic Acid Hives, Rash   Lamotrigine Rash        Medication List     STOP taking these medications    amoxicillin-clavulanate 875-125 MG tablet Commonly known as: AUGMENTIN   furosemide 40 MG tablet Commonly known as: LASIX   ketoconazole 2 % cream Commonly known as: NIZORAL   oxyCODONE-acetaminophen 5-325 MG tablet Commonly known as: PERCOCET/ROXICET       TAKE these medications    albuterol 108 (90 Base) MCG/ACT inhaler Commonly known as: VENTOLIN HFA Inhale 2 puffs into the lungs every 4 (four) hours as needed for wheezing or shortness of breath.   apixaban 5 MG Tabs tablet Commonly known as: ELIQUIS Take 1 tablet (5 mg total) by mouth 2 (two) times daily.  aspirin EC 81 MG tablet Take 1 tablet (81 mg total) by mouth daily.   atorvastatin 40 MG tablet Commonly known as: LIPITOR Take 40 mg by mouth daily.   carvedilol 6.25 MG tablet Commonly known as: COREG Take 1 tablet (6.25 mg total) by mouth 2 (two) times daily with a meal.   cholecalciferol 1000 units tablet Commonly known as: VITAMIN D Take 1,000 Units by mouth daily.    dextromethorphan-guaiFENesin 30-600 MG 12hr tablet Commonly known as: MUCINEX DM Take 1 tablet by mouth 2 (two) times daily.   EPINEPHrine 0.3 mg/0.3 mL Soaj injection Commonly known as: EPI-PEN Inject 0.3 mg into the muscle as needed for anaphylaxis. Follow package instructions as needed for severe allergy or anaphylactic reaction.   Fluticasone-Salmeterol 250-50 MCG/DOSE Aepb Commonly known as: ADVAIR Inhale 1 puff into the lungs 2 (two) times daily.   HYDROcodone-acetaminophen 5-325 MG tablet Commonly known as: NORCO/VICODIN Take 1 tablet by mouth 3 (three) times daily as needed.   losartan 50 MG tablet Commonly known as: COZAAR Take 1 tablet (50 mg total) by mouth daily.   nitroGLYCERIN 0.4 MG SL tablet Commonly known as: NITROSTAT Place 1 tablet (0.4 mg total) under the tongue every 5 (five) minutes as needed for chest pain.   PHENObarbital 100 MG tablet Commonly known as: LUMINAL Take 200 mg by mouth at bedtime.   polyethylene glycol 17 g packet Commonly known as: MIRALAX / GLYCOLAX Take 17 g by mouth daily.   predniSONE 20 MG tablet Commonly known as: DELTASONE Take 1 tablet (20 mg total) by mouth daily with breakfast for 3 days, THEN 0.5 tablets (10 mg total) daily with breakfast for 3 days. Start taking on: May 20, 2023   QUEtiapine 25 MG tablet Commonly known as: SEROQUEL Take 1 tablet (25 mg total) by mouth at bedtime.               Durable Medical Equipment  (From admission, onward)           Start     Ordered   05/19/23 1216  For home use only DME oxygen  Once       Question Answer Comment  Length of Need Lifetime   Liters per Minute 2   Frequency Continuous (stationary and portable oxygen unit needed)   Oxygen conserving device Yes   Oxygen delivery system Gas      05/19/23 1215            Discharge Exam: Filed Weights   05/18/23 0500 05/19/23 0500 05/20/23 0406  Weight: 135.2 kg 134.5 kg 133 kg   General exam: Alert and  awake on intranasal oxygen HEENT: moist mucus membranes, hearing grossly normal  Respiratory system: Diminished breath sounds especially at the bases Cardiovascular system: normal S1/S2, RRR, trace BLE edema.   Gastrointestinal system: soft, NT, ND, no HSM felt, +bowel sounds. Central nervous system: Alert and oriented moving all extremities Skin: dry, intact, normal temperature Psychiatry: normal mood, congruent affect  Condition at discharge: good  The results of significant diagnostics from this hospitalization (including imaging, microbiology, ancillary and laboratory) are listed below for reference.   Imaging Studies: ECHOCARDIOGRAM COMPLETE Result Date: 05/10/2023    ECHOCARDIOGRAM REPORT   Patient Name:   WELBORN KEENA Cochrane Date of Exam: 05/10/2023 Medical Rec #:  [id number]       Height:       70.0 in Accession #:    [id number]      Weight:  314.2 lb Date of Birth:  06-29-1965       BSA:          2.529 m Patient Age:    57 years        BP:           124/66 mmHg Patient Gender: M               HR:           84 bpm. Exam Location:  [facility] Procedure: 2D Echo, Cardiac Doppler, Color Doppler and Strain Analysis (Both            Spectral and Color Flow Doppler were utilized during procedure). Indications:     Syncope  History:         Patient has prior history of Echocardiogram examinations, most                  recent 04/14/2021. CHF, CAD, Pacemaker, COPD,                  Arrythmias:Bradycardia, Signs/Symptoms:Syncope, Chest Pain,                  Edema and Shortness of Breath; Risk Factors:Hypertension, Sleep                  Apnea, Dyslipidemia and Current Smoker.  Sonographer:     Mikki Harbor Referring Phys:  [id number] BRITTON L RUST-CHESTER Diagnosing Phys: Debbe Odea MD  Sonographer Comments: Technically difficult study due to poor echo windows and patient is obese. Image acquisition challenging due to COPD. Global longitudinal strain was attempted. IMPRESSIONS  1. Left ventricular  ejection fraction, by estimation, is 55 to 60%. Left ventricular ejection fraction by PLAX is 57 %. The left ventricle has normal function. The left ventricle has no regional wall motion abnormalities. There is mild left ventricular hypertrophy. Left ventricular diastolic parameters were normal.  2. Right ventricular systolic function is normal. The right ventricular size is normal. There is normal pulmonary artery systolic pressure.  3. The mitral valve is normal in structure. No evidence of mitral valve regurgitation.  4. The aortic valve is tricuspid. Aortic valve regurgitation is not visualized.  5. Aortic dilatation noted. There is borderline dilatation of the aortic root, measuring 38 mm.  6. The inferior vena cava is normal in size with greater than 50% respiratory variability, suggesting right atrial pressure of 3 mmHg. FINDINGS  Left Ventricle: Left ventricular ejection fraction, by estimation, is 55 to 60%. Left ventricular ejection fraction by PLAX is 57 %. The left ventricle has normal function. The left ventricle has no regional wall motion abnormalities. Global longitudinal strain performed but not reported based on interpreter judgement due to suboptimal tracking. The left ventricular internal cavity size was normal in size. There is mild left ventricular hypertrophy. Left ventricular diastolic parameters were  normal. Right Ventricle: The right ventricular size is normal. No increase in right ventricular wall thickness. Right ventricular systolic function is normal. There is normal pulmonary artery systolic pressure. The tricuspid regurgitant velocity is 1.54 m/s, and  with an assumed right atrial pressure of 8 mmHg, the estimated right ventricular systolic pressure is 17.5 mmHg. Left Atrium: Left atrial size was normal in size. Right Atrium: Right atrial size was normal in size. Pericardium: There is no evidence of pericardial effusion. Mitral Valve: The mitral valve is normal in structure. No  evidence of mitral valve regurgitation. MV peak gradient, 2.8 mmHg. The mean mitral valve gradient is  1.0 mmHg. Tricuspid Valve: The tricuspid valve is normal in structure. Tricuspid valve regurgitation is not demonstrated. Aortic Valve: The aortic valve is tricuspid. Aortic valve regurgitation is not visualized. Aortic valve mean gradient measures 4.0 mmHg. Aortic valve peak gradient measures 9.0 mmHg. Aortic valve area, by VTI measures 3.24 cm. Pulmonic Valve: The pulmonic valve was normal in structure. Pulmonic valve regurgitation is not visualized. Aorta: Aortic dilatation noted. There is borderline dilatation of the aortic root, measuring 38 mm. Venous: The inferior vena cava is normal in size with greater than 50% respiratory variability, suggesting right atrial pressure of 3 mmHg. IAS/Shunts: No atrial level shunt detected by color flow Doppler. Additional Comments: 3D was performed not requiring image post processing on an independent workstation and was indeterminate.  LEFT VENTRICLE PLAX 2D LV EF:         Left            Diastology                ventricular     LV e' medial:    11.70 cm/s                ejection        LV E/e' medial:  6.7                fraction by     LV e' lateral:   14.10 cm/s                PLAX is 57      LV E/e' lateral: 5.6                %. LVIDd:         5.30 cm LVIDs:         3.70 cm LV PW:         1.20 cm LV IVS:        1.10 cm LVOT diam:     2.10 cm LV SV:         86 LV SV Index:   34 LVOT Area:     3.46 cm  LV Volumes (MOD) LV vol d, MOD    48.1 ml A2C: LV vol d, MOD    83.7 ml A4C: LV vol s, MOD    23.4 ml A2C: LV vol s, MOD    39.1 ml A4C: LV SV MOD A2C:   24.7 ml LV SV MOD A4C:   83.7 ml LV SV MOD BP:    34.5 ml RIGHT VENTRICLE RV Basal diam:  4.40 cm LEFT ATRIUM             Index        RIGHT ATRIUM           Index LA diam:        3.70 cm 1.46 cm/m   RA Area:     22.20 cm LA Vol (A2C):   53.6 ml 21.19 ml/m  RA Volume:   69.90 ml  27.64 ml/m LA Vol (A4C):   55.0 ml  21.75 ml/m LA Biplane Vol: 55.3 ml 21.87 ml/m  AORTIC VALVE                    PULMONIC VALVE AV Area (Vmax):    2.70 cm     PV Vmax:       0.92 m/s AV Area (Vmean):   2.66 cm     PV Peak grad:  3.4 mmHg AV Area (  VTI):     3.24 cm AV Vmax:           150.00 cm/s AV Vmean:          95.300 cm/s AV VTI:            0.264 m AV Peak Grad:      9.0 mmHg AV Mean Grad:      4.0 mmHg LVOT Vmax:         117.00 cm/s LVOT Vmean:        73.200 cm/s LVOT VTI:          0.247 m LVOT/AV VTI ratio: 0.94  AORTA Ao Root diam: 3.80 cm Ao Asc diam:  3.60 cm MITRAL VALVE               TRICUSPID VALVE MV Area (PHT): 3.91 cm    TR Peak grad:   9.5 mmHg MV Area VTI:   3.65 cm    TR Vmax:        154.00 cm/s MV Peak grad:  2.8 mmHg MV Mean grad:  1.0 mmHg    SHUNTS MV Vmax:       0.83 m/s    Systemic VTI:  0.25 m MV Vmean:      47.2 cm/s   Systemic Diam: 2.10 cm MV Decel Time: 194 msec MV E velocity: 78.50 cm/s MV A velocity: 69.30 cm/s MV E/A ratio:  1.13 Debbe Odea MD Electronically signed by Debbe Odea MD Signature Date/Time: 05/10/2023/12:47:01 PM    Final    US Venous Img Lower Bilateral (DVT) Result Date: 05/09/2023 CLINICAL DATA:  Lower extremity edema EXAM: BILATERAL LOWER EXTREMITY VENOUS DOPPLER ULTRASOUND TECHNIQUE: Gray-scale sonography with compression, as well as color and duplex ultrasound, were performed to evaluate the deep venous system(s) from the level of the common femoral vein through the popliteal and proximal calf veins. COMPARISON:  08/21/2022 FINDINGS: VENOUS Normal compressibility of the common femoral, superficial femoral, and popliteal veins, as well as the visualized calf veins. Visualized portions of profunda femoral vein and great saphenous vein unremarkable. No filling defects to suggest DVT on grayscale or color Doppler imaging. Doppler waveforms show normal direction of venous flow, normal respiratory plasticity and response to augmentation. OTHER None. Limitations: none IMPRESSION: 1. No  evidence of deep venous thrombosis within either lower extremity. Electronically Signed   By: Sharlet Salina M.D.   On: 05/09/2023 15:04   DG Abd 1 View Result Date: 05/09/2023 CLINICAL DATA:  Enteric tube placement. EXAM: ABDOMEN - 1 VIEW COMPARISON:  None Available. FINDINGS: Partially visualized enteric tube with tip in the proximal stomach. Small amount of injected contrast noted adjacent to the tip of the enteric tube. IMPRESSION: Enteric tube with tip in the proximal stomach. Electronically Signed   By: Elgie Collard M.D.   On: 05/09/2023 11:18   CT Head Wo Contrast Result Date: 05/08/2023 CLINICAL DATA:  Initial evaluation for acute mental status change, found down. EXAM: CT HEAD WITHOUT CONTRAST TECHNIQUE: Contiguous axial images were obtained from the base of the skull through the vertex without intravenous contrast. RADIATION DOSE REDUCTION: This exam was performed according to the departmental dose-optimization program which includes automated exposure control, adjustment of the mA and/or kV according to patient size and/or use of iterative reconstruction technique. COMPARISON:  Comparison made with prior CT from 01/17/2019. FINDINGS: Brain: Cerebral volume within normal limits. Patchy hypodensity involving the supratentorial cerebral white matter, consistent with chronic small vessel ischemic disease, mild in nature. Probable few  small remote lacunar infarcts noted about the thalami and right pons. No acute intracranial hemorrhage. No acute large vessel territory infarct. No mass lesion or midline shift. No hydrocephalus or extra-axial fluid collection. Prominent dural calcifications noted along the anterior falx. Vascular: No abnormal hyperdense vessel. Scattered calcified atherosclerosis present about the skull base. Skull: Scalp soft tissues within normal limits.  Calvarium intact. Sinuses/Orbits: Globes and orbital soft tissues within normal limits. Paranasal sinuses are largely clear. No  mastoid effusion. Other: Endotracheal tube partially visualized. IMPRESSION: 1. No acute intracranial abnormality. 2. Mild chronic microvascular ischemic disease with a few small remote lacunar infarcts about the thalami and right pons. Electronically Signed   By: Rise Mu M.D.   On: 05/08/2023 22:19   CT CHEST ABDOMEN PELVIS WO CONTRAST Result Date: 05/08/2023 CLINICAL DATA:  Pneumonia, complication suspected, xray done Altered mental status. EXAM: CT CHEST, ABDOMEN AND PELVIS WITHOUT CONTRAST TECHNIQUE: Multidetector CT imaging of the chest, abdomen and pelvis was performed following the standard protocol without IV contrast. RADIATION DOSE REDUCTION: This exam was performed according to the departmental dose-optimization program which includes automated exposure control, adjustment of the mA and/or kV according to patient size and/or use of iterative reconstruction technique. COMPARISON:  Chest radiographs earlier today.  Chest CT 03/28/2022 FINDINGS: CT CHEST FINDINGS Cardiovascular: The heart is upper normal in size. Pacemaker wires in the right atrium and ventricle. Aortic atherosclerosis without aneurysm. Dilated main pulmonary artery at 4 cm. No pericardial effusion. Mediastinum/Nodes: Enteric tube decompresses the esophagus. Endotracheal tube tip above the carina. Mediastinal adenopathy including a 16 mm anterior paratracheal node, series 2, image 31. Assessment for hilar adenopathy is limited in the absence of IV contrast. No suspicious thyroid nodule. Lungs/Pleura: Extends multifocal ground-glass and consolidative opacities throughout both lungs. There is both lung and all lobar involvement. Greatest airspace disease is in the right middle lobe and lingula with air bronchograms. No cavitary components. There is moderate bronchial thickening. Trace right pleural effusion. Musculoskeletal: There are no acute or suspicious osseous abnormalities. Left-sided pacemaker. CT ABDOMEN PELVIS FINDINGS  Hepatobiliary: Hepatic steatosis. No evidence of focal hepatic abnormality on this unenhanced exam. Gallbladder physiologically distended, no calcified stone. No biliary dilatation. Pancreas: No ductal dilatation or inflammation. Spleen: Normal in size without focal abnormality. Adrenals/Urinary Tract: Normal adrenal glands. 4 mm nonobstructing stone in the right kidney. No hydronephrosis or renal inflammation. Decompressed ureters. Urinary bladder is decompressed by Foley catheter. Stomach/Bowel: Enteric tube tip in the stomach. There is no bowel obstruction or inflammation. Normal appendix. Vascular/Lymphatic: Aortic atherosclerosis. No aortic aneurysm. Shotty periportal and retroperitoneal nodes, likely reactive. Reproductive: Prostate is unremarkable. Other: No ascites or free air. Small fat containing umbilical hernia. Musculoskeletal: There are no acute or suspicious osseous abnormalities. Degenerative change of both hips in the pubic symphysis. Transitional lumbosacral anatomy with sacralization of L5. IMPRESSION: 1. Extensive multifocal ground-glass and consolidative opacities throughout both lungs, greatest in the right middle lobe and lingula. Findings are consistent with multifocal pneumonia. 2. Trace right pleural effusion. 3. Mediastinal adenopathy, likely reactive. 4. No acute abnormality in the abdomen/pelvis. 5. Hepatic steatosis. 6. Nonobstructing right renal stone. Aortic Atherosclerosis (ICD10-I70.0). Electronically Signed   By: Narda Rutherford M.D.   On: 05/08/2023 21:35   CT Cervical Spine Wo Contrast Result Date: 05/08/2023 CLINICAL DATA:  Mental status change, unknown cause, neck trauma, intoxicated or obtunded EXAM: CT CERVICAL SPINE WITHOUT CONTRAST TECHNIQUE: Multidetector CT imaging of the cervical spine was performed without intravenous contrast. Multiplanar CT image reconstructions were also  generated. RADIATION DOSE REDUCTION: This exam was performed according to the departmental  dose-optimization program which includes automated exposure control, adjustment of the mA and/or kV according to patient size and/or use of iterative reconstruction technique. COMPARISON:  CT cervical spine 02/05/2019 FINDINGS: Alignment: No evidence of traumatic malalignment. Skull base and vertebrae: No acute fracture. No primary bone lesion or focal pathologic process. Soft tissues and spinal canal: No prevertebral fluid or swelling. No visible canal hematoma. Disc levels: Multilevel spondylosis, disc space height loss, degenerative endplate changes greatest at C5-C6 and C6-C7 where it is advanced. Posterior disc osteophyte complex at C5-C6 causes moderate spinal canal narrowing. Upper chest: Reported separately. Other: Partially visualized endotracheal and enteric tubes. IMPRESSION: No acute fracture in the cervical spine. Electronically Signed   By: Minerva Fester M.D.   On: 05/08/2023 21:32   DG Chest Port 1 View Result Date: 05/08/2023 CLINICAL DATA:  Post intubation and OG tube placement. EXAM: PORTABLE CHEST 1 VIEW COMPARISON:  May 08, 2023 (6:27 p.m.) FINDINGS: An endotracheal tube is seen with its distal tip approximately 4.7 cm from the carina. An enteric tube is also in place with its distal tip overlying the body of the stomach. The distal side hole sits at the expected region of the gastroesophageal junction. There is stable dual lead AICD positioning. The cardiac silhouette is mildly enlarged and unchanged in size. Stable areas of airspace disease are seen within the mid right lung, right lung base and left perihilar region. No pleural effusion or pneumothorax is identified. The visualized skeletal structures are unremarkable. IMPRESSION: 1. Endotracheal tube and enteric tube positioning, as described above. Advancement of the enteric tube by approximately 5 cm is recommended to decrease the risk of aspiration. 2. Stable bilateral airspace disease. Electronically Signed   By: Aram Candela  M.D.   On: 05/08/2023 20:43   DG Chest Port 1 View Result Date: 05/08/2023 CLINICAL DATA:  Sepsis EXAM: PORTABLE CHEST 1 VIEW COMPARISON:  10/24/2022 FINDINGS: Single frontal view of the chest demonstrates stable enlargement the cardiac silhouette. Dual lead pacer unchanged. Lung volumes are diminished, with crowding of the central vasculature. There is dense right basilar airspace disease with associated small right pleural effusion. No pneumothorax. IMPRESSION: 1. Findings consistent with right basilar pneumonia and small parapneumonic effusion. Electronically Signed   By: Sharlet Salina M.D.   On: 05/08/2023 18:56   CUP PACEART REMOTE DEVICE CHECK Result Date: 04/27/2023 Scheduled remote reviewed. Normal device function.  AHR, one sec, AF burden <0.1% Next remote 91 days. ML, CVRS   Microbiology: Results for orders placed or performed during the hospital encounter of 05/08/23  Resp panel by RT-PCR (RSV, Flu A&B, Covid) Anterior Nasal Swab     Status: None   Collection Time: 05/08/23  6:28 PM   Specimen: Anterior Nasal Swab  Result Value Ref Range Status   SARS Coronavirus 2 by RT PCR NEGATIVE NEGATIVE Final    Comment: (NOTE) SARS-CoV-2 target nucleic acids are NOT DETECTED.  The SARS-CoV-2 RNA is generally detectable in upper respiratory specimens during the acute phase of infection. The lowest concentration of SARS-CoV-2 viral copies this assay can detect is 138 copies/mL. A negative result does not preclude SARS-Cov-2 infection and should not be used as the sole basis for treatment or other patient management decisions. A negative result may occur with  improper specimen collection/handling, submission of specimen other than nasopharyngeal swab, presence of viral mutation(s) within the areas targeted by this assay, and inadequate number of viral copies(<138  copies/mL). A negative result must be combined with clinical observations, patient history, and epidemiological information.  The expected result is Negative.  Fact Sheet for Patients:  BloggerCourse.com  Fact Sheet for Healthcare Providers:  SeriousBroker.it  This test is no t yet approved or cleared by the [place] FDA and  has been authorized for detection and/or diagnosis of SARS-CoV-2 by FDA under an Emergency Use Authorization (EUA). This EUA will remain  in effect (meaning this test can be used) for the duration of the COVID-19 declaration under Section 564(b)(1) of the Act, 21 U.S.C.section 360bbb-3(b)(1), unless the authorization is terminated  or revoked sooner.       Influenza A by PCR NEGATIVE NEGATIVE Final   Influenza B by PCR NEGATIVE NEGATIVE Final    Comment: (NOTE) The Xpert Xpress SARS-CoV-2/FLU/RSV plus assay is intended as an aid in the diagnosis of influenza from Nasopharyngeal swab specimens and should not be used as a sole basis for treatment. Nasal washings and aspirates are unacceptable for Xpert Xpress SARS-CoV-2/FLU/RSV testing.  Fact Sheet for Patients: BloggerCourse.com  Fact Sheet for Healthcare Providers: SeriousBroker.it  This test is not yet approved or cleared by the [place] FDA and has been authorized for detection and/or diagnosis of SARS-CoV-2 by FDA under an Emergency Use Authorization (EUA). This EUA will remain in effect (meaning this test can be used) for the duration of the COVID-19 declaration under Section 564(b)(1) of the Act, 21 U.S.C. section 360bbb-3(b)(1), unless the authorization is terminated or revoked.     Resp Syncytial Virus by PCR NEGATIVE NEGATIVE Final    Comment: (NOTE) Fact Sheet for Patients: BloggerCourse.com  Fact Sheet for Healthcare Providers: SeriousBroker.it  This test is not yet approved or cleared by the [place] FDA and has been authorized for detection and/or  diagnosis of SARS-CoV-2 by FDA under an Emergency Use Authorization (EUA). This EUA will remain in effect (meaning this test can be used) for the duration of the COVID-19 declaration under Section 564(b)(1) of the Act, 21 U.S.C. section 360bbb-3(b)(1), unless the authorization is terminated or revoked.  Performed at [facility], [address] [place], [place] [address]   Blood Culture (routine x 2)     Status: None   Collection Time: 05/08/23  6:28 PM   Specimen: BLOOD  Result Value Ref Range Status   Specimen Description BLOOD BLOOD RIGHT ARM  Final   Special Requests   Final    BOTTLES DRAWN AEROBIC AND ANAEROBIC Blood Culture adequate volume   Culture   Final    NO GROWTH 5 DAYS Performed at [facility], [address], [place], [place] [address]    Report Status 05/13/2023 FINAL  Final  Blood Culture (routine x 2)     Status: Abnormal   Collection Time: 05/08/23  6:28 PM   Specimen: BLOOD LEFT ARM  Result Value Ref Range Status   Specimen Description   Final    BLOOD LEFT ARM Performed at [facility], [address] [place], [place] [address]    Special Requests   Final    BOTTLES DRAWN AEROBIC AND ANAEROBIC Blood Culture results may not be optimal due to an inadequate volume of blood received in culture bottles Performed at [facility], [address] [place], [place] [address]    Culture  Setup Time   Final    GRAM POSITIVE COCCI IN BOTH AEROBIC AND ANAEROBIC BOTTLES CRITICAL RESULT CALLED TO, READ BACK BY AND VERIFIED WITH: [facility]  HALLAJI 05/09/23 1234 MW    Culture (A)  Final    STAPHYLOCOCCUS EPIDERMIDIS STAPHYLOCOCCUS HOMINIS THE SIGNIFICANCE OF ISOLATING THIS ORGANISM FROM A SINGLE SET OF BLOOD CULTURES WHEN MULTIPLE SETS ARE DRAWN IS UNCERTAIN. PLEASE NOTIFY THE MICROBIOLOGY DEPARTMENT WITHIN ONE WEEK IF SPECIATION AND SENSITIVITIES ARE REQUIRED. Performed at [facility], [address], [place], [place]  [address]    Report Status 05/11/2023 FINAL  Final  Blood Culture ID Panel (Reflexed)     Status: Abnormal   Collection Time: 05/08/23  6:28 PM  Result Value Ref Range Status   Enterococcus faecalis NOT DETECTED NOT DETECTED Final   Enterococcus Faecium NOT DETECTED NOT DETECTED Final   Listeria monocytogenes NOT DETECTED NOT DETECTED Final   Staphylococcus species DETECTED (A) NOT DETECTED Final    Comment: CRITICAL RESULT CALLED TO, READ BACK BY AND VERIFIED WITH: SHEEMA HALLAJI 05/09/23 1234 MW    Staphylococcus aureus (BCID) NOT DETECTED NOT DETECTED Final   Staphylococcus epidermidis DETECTED (A) NOT DETECTED Final    Comment: Methicillin (oxacillin) resistant coagulase negative staphylococcus. Possible blood culture contaminant (unless isolated from more than one blood culture draw or clinical case suggests pathogenicity). No antibiotic treatment is indicated for blood  culture contaminants. CRITICAL RESULT CALLED TO, READ BACK BY AND VERIFIED WITH: [facility] 05/09/23 1234 MW    Staphylococcus lugdunensis NOT DETECTED NOT DETECTED Final   Streptococcus species NOT DETECTED NOT DETECTED Final   Streptococcus agalactiae NOT DETECTED NOT DETECTED Final   Streptococcus pneumoniae NOT DETECTED NOT DETECTED Final   Streptococcus pyogenes NOT DETECTED NOT DETECTED Final   A.calcoaceticus-baumannii NOT DETECTED NOT DETECTED Final   Bacteroides fragilis NOT DETECTED NOT DETECTED Final   Enterobacterales NOT DETECTED NOT DETECTED Final   Enterobacter cloacae complex NOT DETECTED NOT DETECTED Final   Escherichia coli NOT DETECTED NOT DETECTED Final   Klebsiella aerogenes NOT DETECTED NOT DETECTED Final   Klebsiella oxytoca NOT DETECTED NOT DETECTED Final   Klebsiella pneumoniae NOT DETECTED NOT DETECTED Final   Proteus species NOT DETECTED NOT DETECTED Final   Salmonella species NOT DETECTED NOT DETECTED Final   Serratia marcescens NOT DETECTED NOT DETECTED Final   Haemophilus influenzae NOT  DETECTED NOT DETECTED Final   Neisseria meningitidis NOT DETECTED NOT DETECTED Final   Pseudomonas aeruginosa NOT DETECTED NOT DETECTED Final   Stenotrophomonas maltophilia NOT DETECTED NOT DETECTED Final   Candida albicans NOT DETECTED NOT DETECTED Final   Candida auris NOT DETECTED NOT DETECTED Final   Candida glabrata NOT DETECTED NOT DETECTED Final   Candida krusei NOT DETECTED NOT DETECTED Final   Candida parapsilosis NOT DETECTED NOT DETECTED Final   Candida tropicalis NOT DETECTED NOT DETECTED Final   Cryptococcus neoformans/gattii NOT DETECTED NOT DETECTED Final   Methicillin resistance mecA/C DETECTED (A) NOT DETECTED Final    Comment: CRITICAL RESULT CALLED TO, READ BACK BY AND VERIFIED WITH: Cher Nakai 05/09/23 1234 MW Performed at [facility], [address] [place], [place] [address]   MRSA Next Gen by PCR, Nasal     Status: None   Collection Time: 05/09/23  8:55 AM   Specimen: Nasal Mucosa; Nasal Swab  Result Value Ref Range Status   MRSA by PCR Next Gen NOT DETECTED NOT DETECTED Final    Comment: (NOTE) The GeneXpert MRSA Assay (FDA approved for NASAL specimens only), is one component of a comprehensive MRSA colonization surveillance program. It is not intended to diagnose MRSA infection nor to guide or monitor treatment for  MRSA infections. Test performance is not FDA approved in patients less than 64 years old. Performed at [facility], [address] [place], [place] [address]   Respiratory (~20 pathogens) panel by PCR     Status: Abnormal   Collection Time: 05/09/23 11:12 AM   Specimen: Nasopharyngeal Swab; Respiratory  Result Value Ref Range Status   Adenovirus DETECTED (A) NOT DETECTED Final   Coronavirus 229E NOT DETECTED NOT DETECTED Final    Comment: (NOTE) The Coronavirus on the Respiratory Panel, DOES NOT test for the novel  Coronavirus (2019 nCoV)    Coronavirus HKU1 NOT DETECTED NOT DETECTED Final   Coronavirus NL63 NOT  DETECTED NOT DETECTED Final   Coronavirus OC43 NOT DETECTED NOT DETECTED Final   Metapneumovirus NOT DETECTED NOT DETECTED Final   Rhinovirus / Enterovirus NOT DETECTED NOT DETECTED Final   Influenza A NOT DETECTED NOT DETECTED Final   Influenza B NOT DETECTED NOT DETECTED Final   Parainfluenza Virus 1 NOT DETECTED NOT DETECTED Final   Parainfluenza Virus 2 NOT DETECTED NOT DETECTED Final   Parainfluenza Virus 3 NOT DETECTED NOT DETECTED Final   Parainfluenza Virus 4 NOT DETECTED NOT DETECTED Final   Respiratory Syncytial Virus NOT DETECTED NOT DETECTED Final   Bordetella pertussis NOT DETECTED NOT DETECTED Final   Bordetella Parapertussis NOT DETECTED NOT DETECTED Final   Chlamydophila pneumoniae NOT DETECTED NOT DETECTED Final   Mycoplasma pneumoniae NOT DETECTED NOT DETECTED Final    Comment: Performed at [facility], [address] [place], [place] [address]  Group A Strep by PCR     Status: None   Collection Time: 05/12/23  4:41 PM   Specimen: Throat; Sterile Swab  Result Value Ref Range Status   Group A Strep by PCR NOT DETECTED NOT DETECTED Final    Comment: Performed at [facility], [address] [place], [place] [address]    Labs: CBC: Recent Labs  Lab 05/16/23 0424 05/17/23 1017 05/18/23 0359 05/19/23 0454 05/20/23 0228  WBC 12.7* 11.5* 12.8* 12.0* 13.0*  NEUTROABS 8.0* 8.0* 7.9* 7.7 8.8*  HGB 11.7* 11.5* 11.2* 11.3* 12.0*  HCT 36.6* 35.5* 34.3* 35.5* 36.6*  MCV 88.2 86.4 86.2 89.2 85.9  PLT 372 401* 389 390 388   Basic Metabolic Panel: Recent Labs  Lab 05/14/23 0601 05/15/23 0430 05/16/23 0424 05/17/23 1017 05/18/23 0359 05/19/23 0454 05/20/23 0228  NA 142 143 141 141 138 136 136  K 4.0 4.1 4.3 4.1 4.6 4.4 4.4  CL 109 112* 108 107 106 106 105  CO2 26 24 25 25 26 25 23   GLUCOSE 112* 95 93 136* 94 122* 83  BUN 50* 40* 31* 32* 36* 32* 30*  CREATININE 1.70* 1.49* 1.44* 1.49* 1.36* 1.37* 1.18  CALCIUM 8.2* 8.5* 8.4* 8.6* 8.6* 8.4*  8.8*  MG 2.5*  --   --   --   --   --   --   PHOS 4.1 4.6  --   --   --   --   --    Liver Function Tests: Recent Labs  Lab 05/14/23 0601 05/15/23 0430  AST 63*  --   ALT 65*  --   ALKPHOS 39  --   BILITOT 0.9  --   PROT 6.8  --   ALBUMIN 2.4* 2.4*   CBG: Recent Labs  Lab 05/20/23 0015 05/20/23 0347 05/20/23 0911 05/20/23 1150 05/20/23 1557  GLUCAP 90 109* 115* 121* 127*    Discharge time spent:  .  Signed: Loyce Dys, MD [facility] 05/20/2023

## 2023-05-20 NOTE — Progress Notes (Signed)
 Physical Therapy Treatment Patient Details Name: Seth Tucker MRN: [id number] DOB: February 09, 1966 Today's Date: 05/20/2023   History of Present Illness Pt is a 58 year old male admitted to ICU due to acute hypoxic/ hypercapnic respiratory failure secondary to possible seizure, right basilar pneumonia and suspected small para-pnemonic effusion  after being found unresponsive, requiring emergent intubation and mechanical ventilatory support, extubated 05/10/23;     PMH significant for Seizure disorder, HFpEF  SSS s/p PPM placement, Pulmonary Embolus on Eliquis, CAD, HLD, OSA    PT Comments  Pt seen for PT tx with pt agreeable. Pt hopeful to d/c home soon. Pt is able to transfer STS without assistance, ambulate multiple laps in room with RW & supervision fade to mod I, no LOB. Pt is making great progress with mobility. Pt would benefit from ongoing PT services to progress gait with LRAD, strengthening & stair negotiation.   If plan is discharge home, recommend the following: A little help with walking and/or transfers;A little help with bathing/dressing/bathroom;Assistance with cooking/housework;Assist for transportation;Help with stairs or ramp for entrance   Can travel by private vehicle        Equipment Recommendations  Rolling walker (2 wheels)    Recommendations for Other Services       Precautions / Restrictions Precautions Precautions: Fall Restrictions Weight Bearing Restrictions Per Provider Order: No     Mobility  Bed Mobility               General bed mobility comments: not tested, pt received & left sitting in recliner    Transfers Overall transfer level: Needs assistance Equipment used: Rolling walker (2 wheels) Transfers: Sit to/from Stand Sit to Stand: Modified independent (Device/Increase time)           General transfer comment: STS from recliner with RW    Ambulation/Gait Ambulation/Gait assistance: Supervision, Modified independent (Device/Increase  time) Gait Distance (Feet):  (40 + 120 ft) Assistive device: Rolling walker (2 wheels) Gait Pattern/deviations: Step-through pattern, Decreased step length - right, Decreased step length - left, Decreased stride length Gait velocity: decreased     General Gait Details: Declined to ambulate in hallway, ambulates laps in room.   Stairs             Wheelchair Mobility     Tilt Bed    Modified Rankin (Stroke Patients Only)       Balance Overall balance assessment: Needs assistance Sitting-balance support: Feet supported       Standing balance support: Bilateral upper extremity supported, No upper extremity supported, During functional activity, Reliant on assistive device for balance Standing balance-Leahy Scale: Good                              Communication Communication Communication: No apparent difficulties  Cognition Arousal: Alert Behavior During Therapy: WFL for tasks assessed/performed   PT - Cognitive impairments: No apparent impairments                           Following commands impaired: Only follows one step commands consistently, Follows multi-step commands with increased time    Cueing Cueing Techniques: Verbal cues  Exercises      General Comments General comments (skin integrity, edema, etc.): pt on 2L/min via nasal cannula, SpO2 >90% throughout session      Pertinent Vitals/Pain Pain Assessment Pain Assessment: No/denies pain    Home Living  Prior Function            PT Goals (current goals can now be found in the care plan section) Acute Rehab PT Goals Patient Stated Goal: to go home PT Goal Formulation: With patient Time For Goal Achievement: 05/25/23 Potential to Achieve Goals: Good Progress towards PT goals: Progressing toward goals    Frequency    Min 2X/week      PT Plan      Co-evaluation              AM-PAC PT "6 Clicks" Mobility   Outcome  Measure  Help needed turning from your back to your side while in a flat bed without using bedrails?: None Help needed moving from lying on your back to sitting on the side of a flat bed without using bedrails?: None Help needed moving to and from a bed to a chair (including a wheelchair)?: A Little Help needed standing up from a chair using your arms (e.g., wheelchair or bedside chair)?: None Help needed to walk in hospital room?: None Help needed climbing 3-5 steps with a railing? : A Little 6 Click Score: 22    End of Session Equipment Utilized During Treatment: Oxygen Activity Tolerance: Patient tolerated treatment well Patient left: in chair;with chair alarm set;with call bell/phone within reach   PT Visit Diagnosis: Muscle weakness (generalized) (M62.81);Difficulty in walking, not elsewhere classified (R26.2)     Time: 1550-1605 PT Time Calculation (min) (ACUTE ONLY): 15 min  Charges:    $Therapeutic Activity: 8-22 mins PT General Charges $$ ACUTE PT VISIT: 1 Visit                     Aleda Grana, PT, DPT 05/20/23, 4:18 PM   Sandi Mariscal 05/20/2023, 4:17 PM

## 2023-05-20 NOTE — Progress Notes (Signed)
 Pt is leaving AMA. Dr. Criss Alvine made aware. Pt to be picked up by brother.

## 2023-05-20 NOTE — Progress Notes (Signed)
 Occupational Therapy Re-Evaluation Patient Details Name: Seth Tucker MRN: [id number] DOB: Aug 08, 1965 Today's Date: 05/20/2023   History of Present Illness   Pt is a 58 year old male admitted to ICU due to acute hypoxic/ hypercapnic respiratory failure secondary to possible seizure, right basilar pneumonia and suspected small para-pnemonic effusion  after being found unresponsive, requiring emergent intubation and mechanical ventilatory support, extubated 05/10/23;     PMH significant for Seizure disorder, HFpEF  SSS s/p PPM placement, Pulmonary Embolus on Eliquis, CAD, HLD, OSA     Clinical Impressions Pt seen today for OT re-evaluation, making good progress towards goals. Pt continues to present with decreased insight into deficits, requires cuing for ADL performance for hygiene thoroughness and has decreased tolerance to activity with additional deficits in balance, strength and safety awareness.  Requires CGA for bed mobility and functional transfers, able to perform 5x STS from elevated bed level without UE support, and performs step pivot transfer to recliner with CGA + RW. Uses urinal seated at supervision level. Pt on 3/L min O2 upon arrival, O2 levels 96%, decreased O2 down to 2L with SpO2% 96%. OT will continue to follow for functional gains. Pt currently unsafe to discharge home as he does not have supervision or assist available, will benefit from continued inpatient follow up therapy, <3 hours/day.      If plan is discharge home, recommend the following:   A lot of help with bathing/dressing/bathroom;A lot of help with walking and/or transfers;Supervision due to cognitive status;Help with stairs or ramp for entrance      Equipment Recommendations   Other (comment)      Precautions/Restrictions   Precautions Precautions: Fall Recall of Precautions/Restrictions: Impaired Precaution/Restrictions Comments: decreased insight into deficits Restrictions Weight Bearing  Restrictions Per Provider Order: No     Mobility Bed Mobility Overal bed mobility: Needs Assistance Bed Mobility: Supine to Sit     Supine to sit: Contact guard          Transfers Overall transfer level: Needs assistance Equipment used: Rolling walker (2 wheels) Transfers: Sit to/from Stand Sit to Stand: Contact guard assist     Step pivot transfers: Contact guard assist     General transfer comment: completes 5x STS transfers from elevated bed surface, no UE support, step pivot to recliner with RW      Balance Overall balance assessment: Needs assistance Sitting-balance support: Feet supported Sitting balance-Leahy Scale: Fair     Standing balance support: During functional activity Standing balance-Leahy Scale: Fair Standing balance comment: CGA for static standing balance, no UE support                           ADL either performed or assessed with clinical judgement   ADL Overall ADL's : Needs assistance/impaired     Grooming: Wash/dry hands;Sitting Grooming Details (indicate cue type and reason): recliner with setup, cues for efficiency and throughness                     Toileting- Architect and Hygiene: Supervision/safety Toileting - Clothing Manipulation Details (indicate cue type and reason): seated use of urinal in recliner              Pertinent Vitals/Pain Pain Assessment Pain Assessment: No/denies pain        Communication Communication Communication: Impaired   Cognition Arousal: Alert Behavior During Therapy: WFL for tasks assessed/performed Cognition: No family/caregiver present to determine baseline, Cognition impaired  Orientation impairments: Time, Situation Awareness: Intellectual awareness impaired, Online awareness impaired Memory impairment (select all impairments): Short-term memory, Working memory Attention impairment (select first level of impairment): Sustained attention Executive  functioning impairment (select all impairments): Reasoning, Problem solving OT - Cognition Comments: noted decreased insight into deficits,mildly inappropriate comments towards this author                 Following commands: Impaired Following commands impaired: Follows one step commands with increased time     Cueing  General Comments   Cueing Techniques: Verbal cues;Tactile cues      Exercises Exercises: General Lower Extremity General Exercises - Lower Extremity Hip Flexion/Marching: Standing, Both, 20 reps, Strengthening Other Exercises Other Exercises: 5x STS Other Exercises: 20x standing marches             OT Goals(Current goals can be found in the care plan section)   Acute Rehab OT Goals OT Goal Formulation: With patient Time For Goal Achievement: 06/03/23 Potential to Achieve Goals: Fair ADL Goals Pt Will Perform Grooming: with modified independence;sitting Pt Will Perform Lower Body Dressing: with modified independence;sit to/from stand;sitting/lateral leans Pt Will Transfer to Toilet: with modified independence;ambulating Pt Will Perform Toileting - Clothing Manipulation and hygiene: with modified independence;sitting/lateral leans;sit to/from stand   OT Frequency:  Min 3X/week       AM-PAC OT "6 Clicks" Daily Activity     Outcome Measure Help from another person eating meals?: A Little Help from another person taking care of personal grooming?: A Little Help from another person toileting, which includes using toliet, bedpan, or urinal?: A Lot Help from another person bathing (including washing, rinsing, drying)?: A Lot Help from another person to put on and taking off regular upper body clothing?: A Little Help from another person to put on and taking off regular lower body clothing?: A Lot 6 Click Score: 15   End of Session Equipment Utilized During Treatment: Rolling walker (2 wheels);Oxygen Nurse Communication: Mobility status  Activity  Tolerance: Patient tolerated treatment well Patient left: in chair;with call bell/phone within reach;with chair alarm set  OT Visit Diagnosis: Other abnormalities of gait and mobility (R26.89);Muscle weakness (generalized) (M62.81);Cognitive communication deficit (R41.841)                Time: 1610-9604 OT Time Calculation (min): 25 min Charges:  OT General Charges $OT Visit: 1 Visit OT Evaluation $OT Re-eval: 1 Re-eval OT Treatments $Self Care/Home Management : 23-37 mins  Allyna Pittsley L. Silas Muff, OTR/L  05/20/23, 12:59 PM

## 2023-05-27 ENCOUNTER — Telehealth (INDEPENDENT_AMBULATORY_CARE_PROVIDER_SITE_OTHER): Payer: Self-pay

## 2023-05-27 NOTE — Telephone Encounter (Signed)
 Patient called stating that he was hospitalized for 14 days and now has extreme swelling in his ankles he would like to come in and start wraps again.  I called to see if he has been using his pumps and elevating since being released on 05/20/23 but I didn't get an answer. I left a voicemail.   Please advise

## 2023-05-27 NOTE — Telephone Encounter (Signed)
 That is fine

## 2023-05-29 ENCOUNTER — Encounter (INDEPENDENT_AMBULATORY_CARE_PROVIDER_SITE_OTHER): Payer: Self-pay | Admitting: Nurse Practitioner

## 2023-05-29 ENCOUNTER — Ambulatory Visit (INDEPENDENT_AMBULATORY_CARE_PROVIDER_SITE_OTHER): Payer: MEDICAID | Admitting: Nurse Practitioner

## 2023-05-29 VITALS — BP 165/89 | HR 73 | Resp 18

## 2023-05-29 DIAGNOSIS — I890 Lymphedema, not elsewhere classified: Secondary | ICD-10-CM | POA: Diagnosis not present

## 2023-05-29 NOTE — Progress Notes (Signed)
 History of Present Illness  There is no documented history at this time  Assessments & Plan   There are no diagnoses linked to this encounter.    Additional instructions  Subjective:  Patient presents with venous ulcer of the Bilateral lower extremity.    Procedure:  3 layer unna wrap was placed Bilateral lower extremity.   Plan:   Follow up in one week.

## 2023-05-31 NOTE — Progress Notes (Signed)
 Remote pacemaker transmission.

## 2023-05-31 NOTE — Addendum Note (Signed)
 Addended by: Elease Etienne A on: 05/31/2023 01:19 PM   Modules accepted: Orders

## 2023-06-06 ENCOUNTER — Encounter (INDEPENDENT_AMBULATORY_CARE_PROVIDER_SITE_OTHER): Payer: Self-pay

## 2023-06-06 ENCOUNTER — Ambulatory Visit (INDEPENDENT_AMBULATORY_CARE_PROVIDER_SITE_OTHER): Payer: MEDICAID | Admitting: Nurse Practitioner

## 2023-06-06 VITALS — BP 150/90 | HR 56 | Resp 18

## 2023-06-06 DIAGNOSIS — I890 Lymphedema, not elsewhere classified: Secondary | ICD-10-CM | POA: Diagnosis not present

## 2023-06-06 NOTE — Progress Notes (Signed)
 History of Present Illness  There is no documented history at this time  Assessments & Plan   There are no diagnoses linked to this encounter.    Additional instructions  Subjective:  Patient presents with venous ulcer of the Bilateral lower extremity.    Procedure:  3 layer unna wrap was placed Bilateral lower extremity.   Plan:   Follow up in one week.

## 2023-06-13 ENCOUNTER — Encounter (INDEPENDENT_AMBULATORY_CARE_PROVIDER_SITE_OTHER): Payer: MEDICAID

## 2023-06-20 ENCOUNTER — Encounter (INDEPENDENT_AMBULATORY_CARE_PROVIDER_SITE_OTHER): Payer: MEDICAID

## 2023-06-20 DEATH — deceased

## 2023-06-26 ENCOUNTER — Telehealth (INDEPENDENT_AMBULATORY_CARE_PROVIDER_SITE_OTHER): Payer: Self-pay | Admitting: Nurse Practitioner

## 2023-06-26 NOTE — Telephone Encounter (Signed)
 NA

## 2023-06-27 ENCOUNTER — Encounter (INDEPENDENT_AMBULATORY_CARE_PROVIDER_SITE_OTHER): Payer: MEDICAID

## 2023-07-04 ENCOUNTER — Encounter (INDEPENDENT_AMBULATORY_CARE_PROVIDER_SITE_OTHER): Payer: MEDICAID

## 2023-07-11 ENCOUNTER — Ambulatory Visit (INDEPENDENT_AMBULATORY_CARE_PROVIDER_SITE_OTHER): Payer: MEDICAID | Admitting: Nurse Practitioner
